# Patient Record
Sex: Male | Born: 1945
Health system: Southern US, Community
[De-identification: ages and names within clinical notes are randomized; demographics above are authoritative.]

## PROBLEM LIST (undated history)

## (undated) DIAGNOSIS — N289 Disorder of kidney and ureter, unspecified: Secondary | ICD-10-CM

## (undated) DIAGNOSIS — I10 Essential (primary) hypertension: Secondary | ICD-10-CM

## (undated) DIAGNOSIS — D649 Anemia, unspecified: Secondary | ICD-10-CM

---

## 2007-01-24 ENCOUNTER — Ambulatory Visit: Payer: Self-pay | Admitting: Internal Medicine

## 2010-07-27 NOTE — Letter (Signed)
January 24, 2007    Dr. Rickard Patience  Department of Neurology, [facility]  CB# [id number]  [address]  [place], [place] [address]   RE:  Marvin Mendez, Marvin Mendez  MRN:  [id number]  /  DOB:  Dec 11, 1945   Dear Marvin Mendez:   Thank you so much for calling me today back about Marvin Mendez.  A copy  of this letter is going to Dr. Martha Clan, who referred him to me, and I  appreciate his being willing to take the format in a letter to you.   As I mentioned to you on the telephone, Marvin Mendez is a 65 year old  African-American gentleman who has some degree of renal insufficiency,  the cause of which is not yet clear.  He has a remote history of  hypertension but this has not been a problem of late, apparently, and  certainly not one that has required therapy.   Over the last couple of years, and progressively over the last couple of  months, he has had significant problems with orthostatic tolerances, had  documented falls in blood pressure of 50 to 70 millimeters.  He has  actually been largely incapacitated by this.  He was tried off the  cortisone, which he did not tolerate, and most recently ProAmatine, the  taking of which was complicated by nausea and abdominal pain, prompting  him to discontinue it and then further up titrate it.  Taking it, he has  side effects but he is much more able to get around than he has been.   His autonomic review of systems is notable for problems with  constipation, some degree of dry eyes and dry mouth, though this has  been less impressive.  He has had no problems with his urine stream.  He  came with his sister so I did not go over his sexual history.   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  Notably, he does not have diabetes and he  has no problems with paresthesias in his lower extremity and no muscle  weakness.   PAST SURGICAL HISTORY:  Is only notable for hemorrhoidectomy.   REVIEW OF SYSTEMS:  Apart from the above, is noncontributory.   CURRENT MEDICATIONS:  1. Had included amlodipine, Tekturna, sertraline, simvastatin,      furosemide, fludrocortisone and omeprazole, all of which have been      now discontinued.  2. He is on midodrine at 5 mg twice a day.   HE IS ALLERGIC TO:  1. CODEINE.  2. ASPIRIN.   SOCIAL HISTORY:  1. Is a previously noted.  2. He is retired from [employer].  3. He used to smoke.  4. He does not use alcohol or recreational drugs.   PAST FAMILY PSYCHIATRIC HISTORY:  On examination today, his blood pressure was 124/72, his pulse was 90,  his weight was 197 pounds.  Orthostatic vital signs demonstrate a blood  pressure of 144/86 with a pulse of 81 lying, at sitting at 0 minutes it  was 125/78 with a pulse of 89, standing at 0 minutes it was 111/60 with  a pulse of 94.  The heart rate increased to 98 at 5 minutes, the blood  pressure continued to fall to a nadir of 87.  This was surprisingly  associated with few symptoms.  HEENT EXAM:  Demonstrated no icterus or xanthoma.  NECK VEINS:  Flat.  CAROTIDS:  Brisk and full bilaterally  without bruits.  BACK:  Without kyphosis or scoliosis.  LUNGS:  Clear.  HEART SOUNDS:  Regular, without murmurs or gallops.  ABDOMEN:  Soft with active bowel sounds, without midline pulsation or  hepatomegaly.  Femoral pulses were 2+, distal pulses were intact.  There is no  clubbing, cyanosis or edema.  NEUROLOGICAL EXAM:  Grossly normal.  SKIN:  Warm and dry.   Autonomic testing included respiratory variation of RR intervals with a  maximum delta of 1.05% and a qualitative Valsalva maneuver that was  associated with a change in RR intervals again of about 1.05.   IMPRESSION:  1. Severe orthostatic intolerance.  2. Prior history of hypertension.  3. Renal insufficiency with creatinines in the mid 2's to mid 3's.  4. Evidence of autonomic insufficiency based on heart rate response to       maneuvers as described above.   Marvin Mendez, Marvin Mendez has orthostatic intolerance, a somewhat surprising  heart rate response to changes in position given the lack of heart rate  response to the stresses as noted.  I am concerned that he may have an  underlying primary autonomic disorder given the paucity of other  triggering events although, reviewing his past records and seeing the  degree of hypertension, there may be some degree of vessel noncompliance  contributing to this.  That would certainly not explain the lack of  heart rate variability.   Given your expertise and the availability of more sophisticated testing,  I have asked him to see you so that we can come up with some plans.   In the interim, I have given him the following therapeutic suggestions:  1. To raise the head of his bed 6 inches.  2. To use isometric exercises prior to standing.  3. To be cognizant of hot temperatures, particularly showers, and      ambient temperature.  4. To use waist-high stockings and have given him a prescription today      for 20 to 30 millimeter waist-high stockings.   I appreciate your willingness to talk to me about him and look forward  to your assessment of him.   As I have noted to you on the phone, Dr. Samuel Germany office will be calling  as he is the primary caregiver here.   Thanks again for your help.    Sincerely,      Duke Salvia, MD, [facility]  Electronically Signed    SCK/MedQ  DD: 01/24/2007  DT: 01/25/2007  Job #: [id number]   CC:   Arlan Organ  [facility] [place]

## 2015-04-01 DIAGNOSIS — D696 Thrombocytopenia, unspecified: Secondary | ICD-10-CM | POA: Diagnosis not present

## 2015-04-01 DIAGNOSIS — G909 Disorder of the autonomic nervous system, unspecified: Secondary | ICD-10-CM | POA: Diagnosis not present

## 2015-04-01 DIAGNOSIS — I1310 Hypertensive heart and chronic kidney disease without heart failure, with stage 1 through stage 4 chronic kidney disease, or unspecified chronic kidney disease: Secondary | ICD-10-CM | POA: Diagnosis not present

## 2015-04-01 DIAGNOSIS — G4730 Sleep apnea, unspecified: Secondary | ICD-10-CM | POA: Diagnosis not present

## 2015-04-01 DIAGNOSIS — D841 Defects in the complement system: Secondary | ICD-10-CM | POA: Diagnosis not present

## 2015-04-01 DIAGNOSIS — N183 Chronic kidney disease, stage 3 (moderate): Secondary | ICD-10-CM | POA: Diagnosis not present

## 2015-04-14 DIAGNOSIS — G4730 Sleep apnea, unspecified: Secondary | ICD-10-CM | POA: Diagnosis not present

## 2015-05-20 DIAGNOSIS — G4730 Sleep apnea, unspecified: Secondary | ICD-10-CM | POA: Diagnosis not present

## 2015-05-27 DIAGNOSIS — G4700 Insomnia, unspecified: Secondary | ICD-10-CM | POA: Diagnosis not present

## 2015-07-29 DIAGNOSIS — I129 Hypertensive chronic kidney disease with stage 1 through stage 4 chronic kidney disease, or unspecified chronic kidney disease: Secondary | ICD-10-CM | POA: Diagnosis not present

## 2015-07-29 DIAGNOSIS — Z79899 Other long term (current) drug therapy: Secondary | ICD-10-CM | POA: Diagnosis not present

## 2015-07-29 DIAGNOSIS — N183 Chronic kidney disease, stage 3 (moderate): Secondary | ICD-10-CM | POA: Diagnosis not present

## 2015-07-29 DIAGNOSIS — D691 Qualitative platelet defects: Secondary | ICD-10-CM | POA: Diagnosis not present

## 2015-07-29 DIAGNOSIS — E785 Hyperlipidemia, unspecified: Secondary | ICD-10-CM | POA: Diagnosis not present

## 2015-07-29 DIAGNOSIS — Z298 Encounter for other specified prophylactic measures: Secondary | ICD-10-CM | POA: Diagnosis not present

## 2015-07-29 DIAGNOSIS — G909 Disorder of the autonomic nervous system, unspecified: Secondary | ICD-10-CM | POA: Diagnosis not present

## 2016-02-02 DIAGNOSIS — Z125 Encounter for screening for malignant neoplasm of prostate: Secondary | ICD-10-CM | POA: Diagnosis not present

## 2016-02-02 DIAGNOSIS — I1310 Hypertensive heart and chronic kidney disease without heart failure, with stage 1 through stage 4 chronic kidney disease, or unspecified chronic kidney disease: Secondary | ICD-10-CM | POA: Diagnosis not present

## 2016-02-02 DIAGNOSIS — R7309 Other abnormal glucose: Secondary | ICD-10-CM | POA: Diagnosis not present

## 2016-02-02 DIAGNOSIS — N401 Enlarged prostate with lower urinary tract symptoms: Secondary | ICD-10-CM | POA: Diagnosis not present

## 2016-02-02 DIAGNOSIS — D696 Thrombocytopenia, unspecified: Secondary | ICD-10-CM | POA: Diagnosis not present

## 2016-02-02 DIAGNOSIS — R3912 Poor urinary stream: Secondary | ICD-10-CM | POA: Diagnosis not present

## 2016-02-02 DIAGNOSIS — N183 Chronic kidney disease, stage 3 (moderate): Secondary | ICD-10-CM | POA: Diagnosis not present

## 2016-02-02 DIAGNOSIS — G909 Disorder of the autonomic nervous system, unspecified: Secondary | ICD-10-CM | POA: Diagnosis not present

## 2016-02-29 DIAGNOSIS — N529 Male erectile dysfunction, unspecified: Secondary | ICD-10-CM | POA: Diagnosis not present

## 2016-02-29 DIAGNOSIS — N281 Cyst of kidney, acquired: Secondary | ICD-10-CM | POA: Diagnosis not present

## 2016-02-29 DIAGNOSIS — N401 Enlarged prostate with lower urinary tract symptoms: Secondary | ICD-10-CM | POA: Diagnosis not present

## 2016-02-29 DIAGNOSIS — N289 Disorder of kidney and ureter, unspecified: Secondary | ICD-10-CM | POA: Diagnosis not present

## 2016-03-10 DIAGNOSIS — N281 Cyst of kidney, acquired: Secondary | ICD-10-CM | POA: Diagnosis not present

## 2016-03-11 DIAGNOSIS — H524 Presbyopia: Secondary | ICD-10-CM | POA: Diagnosis not present

## 2016-03-11 DIAGNOSIS — H2513 Age-related nuclear cataract, bilateral: Secondary | ICD-10-CM | POA: Diagnosis not present

## 2016-03-11 DIAGNOSIS — H18413 Arcus senilis, bilateral: Secondary | ICD-10-CM | POA: Diagnosis not present

## 2016-04-12 DIAGNOSIS — K5900 Constipation, unspecified: Secondary | ICD-10-CM | POA: Diagnosis not present

## 2016-04-12 DIAGNOSIS — Z1211 Encounter for screening for malignant neoplasm of colon: Secondary | ICD-10-CM | POA: Diagnosis not present

## 2016-04-12 DIAGNOSIS — Z86010 Personal history of colonic polyps: Secondary | ICD-10-CM | POA: Diagnosis not present

## 2016-04-15 DIAGNOSIS — R6889 Other general symptoms and signs: Secondary | ICD-10-CM | POA: Diagnosis not present

## 2016-04-15 DIAGNOSIS — R5383 Other fatigue: Secondary | ICD-10-CM | POA: Diagnosis not present

## 2016-06-01 DIAGNOSIS — D123 Benign neoplasm of transverse colon: Secondary | ICD-10-CM | POA: Diagnosis not present

## 2016-06-01 DIAGNOSIS — Z1211 Encounter for screening for malignant neoplasm of colon: Secondary | ICD-10-CM | POA: Diagnosis not present

## 2016-06-01 DIAGNOSIS — K635 Polyp of colon: Secondary | ICD-10-CM | POA: Diagnosis not present

## 2016-06-01 DIAGNOSIS — Z86010 Personal history of colonic polyps: Secondary | ICD-10-CM | POA: Diagnosis not present

## 2016-06-03 DIAGNOSIS — R1033 Periumbilical pain: Secondary | ICD-10-CM | POA: Diagnosis not present

## 2016-08-02 DIAGNOSIS — R7309 Other abnormal glucose: Secondary | ICD-10-CM | POA: Diagnosis not present

## 2016-08-02 DIAGNOSIS — G909 Disorder of the autonomic nervous system, unspecified: Secondary | ICD-10-CM | POA: Diagnosis not present

## 2016-08-02 DIAGNOSIS — I1310 Hypertensive heart and chronic kidney disease without heart failure, with stage 1 through stage 4 chronic kidney disease, or unspecified chronic kidney disease: Secondary | ICD-10-CM | POA: Diagnosis not present

## 2016-08-02 DIAGNOSIS — E785 Hyperlipidemia, unspecified: Secondary | ICD-10-CM | POA: Diagnosis not present

## 2016-08-02 DIAGNOSIS — Z0000 Encounter for general adult medical examination without abnormal findings: Secondary | ICD-10-CM | POA: Diagnosis not present

## 2016-08-02 DIAGNOSIS — Z2820 Immunization not carried out because of patient decision for unspecified reason: Secondary | ICD-10-CM | POA: Diagnosis not present

## 2016-08-02 DIAGNOSIS — N183 Chronic kidney disease, stage 3 (moderate): Secondary | ICD-10-CM | POA: Diagnosis not present

## 2016-08-02 DIAGNOSIS — D696 Thrombocytopenia, unspecified: Secondary | ICD-10-CM | POA: Diagnosis not present

## 2016-10-05 DIAGNOSIS — I1310 Hypertensive heart and chronic kidney disease without heart failure, with stage 1 through stage 4 chronic kidney disease, or unspecified chronic kidney disease: Secondary | ICD-10-CM | POA: Diagnosis not present

## 2016-10-05 DIAGNOSIS — G909 Disorder of the autonomic nervous system, unspecified: Secondary | ICD-10-CM | POA: Diagnosis not present

## 2016-10-05 DIAGNOSIS — N183 Chronic kidney disease, stage 3 (moderate): Secondary | ICD-10-CM | POA: Diagnosis not present

## 2016-10-05 DIAGNOSIS — Z0001 Encounter for general adult medical examination with abnormal findings: Secondary | ICD-10-CM | POA: Diagnosis not present

## 2017-01-25 DIAGNOSIS — Z125 Encounter for screening for malignant neoplasm of prostate: Secondary | ICD-10-CM | POA: Diagnosis not present

## 2017-01-25 DIAGNOSIS — I1310 Hypertensive heart and chronic kidney disease without heart failure, with stage 1 through stage 4 chronic kidney disease, or unspecified chronic kidney disease: Secondary | ICD-10-CM | POA: Diagnosis not present

## 2017-01-25 DIAGNOSIS — G909 Disorder of the autonomic nervous system, unspecified: Secondary | ICD-10-CM | POA: Diagnosis not present

## 2017-01-25 DIAGNOSIS — R5383 Other fatigue: Secondary | ICD-10-CM | POA: Diagnosis not present

## 2017-01-25 DIAGNOSIS — D539 Nutritional anemia, unspecified: Secondary | ICD-10-CM | POA: Diagnosis not present

## 2017-01-25 DIAGNOSIS — N183 Chronic kidney disease, stage 3 (moderate): Secondary | ICD-10-CM | POA: Diagnosis not present

## 2017-02-01 DIAGNOSIS — D696 Thrombocytopenia, unspecified: Secondary | ICD-10-CM | POA: Diagnosis not present

## 2017-02-01 DIAGNOSIS — N183 Chronic kidney disease, stage 3 (moderate): Secondary | ICD-10-CM | POA: Diagnosis not present

## 2017-02-01 DIAGNOSIS — D539 Nutritional anemia, unspecified: Secondary | ICD-10-CM | POA: Diagnosis not present

## 2017-02-01 DIAGNOSIS — D519 Vitamin B12 deficiency anemia, unspecified: Secondary | ICD-10-CM | POA: Diagnosis not present

## 2017-02-08 DIAGNOSIS — D519 Vitamin B12 deficiency anemia, unspecified: Secondary | ICD-10-CM | POA: Diagnosis not present

## 2017-02-15 DIAGNOSIS — D2272 Melanocytic nevi of left lower limb, including hip: Secondary | ICD-10-CM | POA: Diagnosis not present

## 2017-02-15 DIAGNOSIS — D519 Vitamin B12 deficiency anemia, unspecified: Secondary | ICD-10-CM | POA: Diagnosis not present

## 2017-02-15 DIAGNOSIS — D485 Neoplasm of uncertain behavior of skin: Secondary | ICD-10-CM | POA: Diagnosis not present

## 2017-02-22 DIAGNOSIS — D519 Vitamin B12 deficiency anemia, unspecified: Secondary | ICD-10-CM | POA: Diagnosis not present

## 2017-03-02 DIAGNOSIS — D485 Neoplasm of uncertain behavior of skin: Secondary | ICD-10-CM | POA: Diagnosis not present

## 2017-03-02 DIAGNOSIS — L97929 Non-pressure chronic ulcer of unspecified part of left lower leg with unspecified severity: Secondary | ICD-10-CM | POA: Diagnosis not present

## 2017-03-22 DIAGNOSIS — N529 Male erectile dysfunction, unspecified: Secondary | ICD-10-CM | POA: Diagnosis not present

## 2017-03-22 DIAGNOSIS — N401 Enlarged prostate with lower urinary tract symptoms: Secondary | ICD-10-CM | POA: Diagnosis not present

## 2017-05-25 DIAGNOSIS — D696 Thrombocytopenia, unspecified: Secondary | ICD-10-CM | POA: Diagnosis not present

## 2017-05-25 DIAGNOSIS — N183 Chronic kidney disease, stage 3 (moderate): Secondary | ICD-10-CM | POA: Diagnosis not present

## 2017-05-25 DIAGNOSIS — I1310 Hypertensive heart and chronic kidney disease without heart failure, with stage 1 through stage 4 chronic kidney disease, or unspecified chronic kidney disease: Secondary | ICD-10-CM | POA: Diagnosis not present

## 2017-05-25 DIAGNOSIS — D519 Vitamin B12 deficiency anemia, unspecified: Secondary | ICD-10-CM | POA: Diagnosis not present

## 2017-05-25 DIAGNOSIS — G909 Disorder of the autonomic nervous system, unspecified: Secondary | ICD-10-CM | POA: Diagnosis not present

## 2017-08-03 DIAGNOSIS — Z0000 Encounter for general adult medical examination without abnormal findings: Secondary | ICD-10-CM | POA: Diagnosis not present

## 2017-08-30 DIAGNOSIS — D485 Neoplasm of uncertain behavior of skin: Secondary | ICD-10-CM | POA: Diagnosis not present

## 2017-09-05 DIAGNOSIS — H2513 Age-related nuclear cataract, bilateral: Secondary | ICD-10-CM | POA: Diagnosis not present

## 2017-09-05 DIAGNOSIS — H524 Presbyopia: Secondary | ICD-10-CM | POA: Diagnosis not present

## 2017-10-10 DIAGNOSIS — I1310 Hypertensive heart and chronic kidney disease without heart failure, with stage 1 through stage 4 chronic kidney disease, or unspecified chronic kidney disease: Secondary | ICD-10-CM | POA: Diagnosis not present

## 2017-10-10 DIAGNOSIS — N183 Chronic kidney disease, stage 3 (moderate): Secondary | ICD-10-CM | POA: Diagnosis not present

## 2017-10-10 DIAGNOSIS — D539 Nutritional anemia, unspecified: Secondary | ICD-10-CM | POA: Diagnosis not present

## 2017-10-10 DIAGNOSIS — D696 Thrombocytopenia, unspecified: Secondary | ICD-10-CM | POA: Diagnosis not present

## 2017-10-10 DIAGNOSIS — Z79899 Other long term (current) drug therapy: Secondary | ICD-10-CM | POA: Diagnosis not present

## 2017-10-10 DIAGNOSIS — I951 Orthostatic hypotension: Secondary | ICD-10-CM | POA: Diagnosis not present

## 2017-10-10 DIAGNOSIS — G909 Disorder of the autonomic nervous system, unspecified: Secondary | ICD-10-CM | POA: Diagnosis not present

## 2017-10-10 DIAGNOSIS — R7309 Other abnormal glucose: Secondary | ICD-10-CM | POA: Diagnosis not present

## 2017-10-10 DIAGNOSIS — D519 Vitamin B12 deficiency anemia, unspecified: Secondary | ICD-10-CM | POA: Diagnosis not present

## 2017-11-10 DIAGNOSIS — K141 Geographic tongue: Secondary | ICD-10-CM | POA: Diagnosis not present

## 2018-02-14 ENCOUNTER — Emergency Department (HOSPITAL_COMMUNITY): Payer: PPO

## 2018-02-14 ENCOUNTER — Encounter (HOSPITAL_COMMUNITY): Payer: Self-pay

## 2018-02-14 ENCOUNTER — Inpatient Hospital Stay (HOSPITAL_COMMUNITY)
Admission: EM | Admit: 2018-02-14 | Discharge: 2018-03-14 | DRG: 004 | Disposition: E | Payer: PPO | Attending: Pulmonary Disease | Admitting: Pulmonary Disease

## 2018-02-14 ENCOUNTER — Other Ambulatory Visit: Payer: Self-pay

## 2018-02-14 DIAGNOSIS — I629 Nontraumatic intracranial hemorrhage, unspecified: Secondary | ICD-10-CM | POA: Diagnosis not present

## 2018-02-14 DIAGNOSIS — R739 Hyperglycemia, unspecified: Secondary | ICD-10-CM | POA: Diagnosis present

## 2018-02-14 DIAGNOSIS — N183 Chronic kidney disease, stage 3 (moderate): Secondary | ICD-10-CM | POA: Diagnosis present

## 2018-02-14 DIAGNOSIS — Z781 Physical restraint status: Secondary | ICD-10-CM

## 2018-02-14 DIAGNOSIS — Z930 Tracheostomy status: Secondary | ICD-10-CM

## 2018-02-14 DIAGNOSIS — G936 Cerebral edema: Secondary | ICD-10-CM | POA: Diagnosis not present

## 2018-02-14 DIAGNOSIS — R1310 Dysphagia, unspecified: Secondary | ICD-10-CM | POA: Diagnosis present

## 2018-02-14 DIAGNOSIS — Q282 Arteriovenous malformation of cerebral vessels: Secondary | ICD-10-CM

## 2018-02-14 DIAGNOSIS — I614 Nontraumatic intracerebral hemorrhage in cerebellum: Principal | ICD-10-CM | POA: Diagnosis present

## 2018-02-14 DIAGNOSIS — J189 Pneumonia, unspecified organism: Secondary | ICD-10-CM | POA: Diagnosis not present

## 2018-02-14 DIAGNOSIS — Z9911 Dependence on respirator [ventilator] status: Secondary | ICD-10-CM | POA: Diagnosis not present

## 2018-02-14 DIAGNOSIS — I609 Nontraumatic subarachnoid hemorrhage, unspecified: Secondary | ICD-10-CM | POA: Diagnosis present

## 2018-02-14 DIAGNOSIS — J9601 Acute respiratory failure with hypoxia: Secondary | ICD-10-CM | POA: Diagnosis not present

## 2018-02-14 DIAGNOSIS — R404 Transient alteration of awareness: Secondary | ICD-10-CM | POA: Diagnosis not present

## 2018-02-14 DIAGNOSIS — I499 Cardiac arrhythmia, unspecified: Secondary | ICD-10-CM | POA: Diagnosis not present

## 2018-02-14 DIAGNOSIS — E870 Hyperosmolality and hypernatremia: Secondary | ICD-10-CM | POA: Diagnosis not present

## 2018-02-14 DIAGNOSIS — J9690 Respiratory failure, unspecified, unspecified whether with hypoxia or hypercapnia: Secondary | ICD-10-CM

## 2018-02-14 DIAGNOSIS — J9600 Acute respiratory failure, unspecified whether with hypoxia or hypercapnia: Secondary | ICD-10-CM | POA: Diagnosis not present

## 2018-02-14 DIAGNOSIS — E876 Hypokalemia: Secondary | ICD-10-CM | POA: Diagnosis not present

## 2018-02-14 DIAGNOSIS — I951 Orthostatic hypotension: Secondary | ICD-10-CM | POA: Diagnosis present

## 2018-02-14 DIAGNOSIS — S065X9A Traumatic subdural hemorrhage with loss of consciousness of unspecified duration, initial encounter: Secondary | ICD-10-CM | POA: Diagnosis not present

## 2018-02-14 DIAGNOSIS — R1312 Dysphagia, oropharyngeal phase: Secondary | ICD-10-CM | POA: Diagnosis not present

## 2018-02-14 DIAGNOSIS — Z451 Encounter for adjustment and management of infusion pump: Secondary | ICD-10-CM | POA: Diagnosis not present

## 2018-02-14 DIAGNOSIS — D696 Thrombocytopenia, unspecified: Secondary | ICD-10-CM | POA: Diagnosis not present

## 2018-02-14 DIAGNOSIS — R0689 Other abnormalities of breathing: Secondary | ICD-10-CM

## 2018-02-14 DIAGNOSIS — G9340 Encephalopathy, unspecified: Secondary | ICD-10-CM | POA: Diagnosis not present

## 2018-02-14 DIAGNOSIS — I6200 Nontraumatic subdural hemorrhage, unspecified: Secondary | ICD-10-CM | POA: Diagnosis not present

## 2018-02-14 DIAGNOSIS — T17320A Food in larynx causing asphyxiation, initial encounter: Secondary | ICD-10-CM | POA: Diagnosis not present

## 2018-02-14 DIAGNOSIS — K5900 Constipation, unspecified: Secondary | ICD-10-CM | POA: Diagnosis not present

## 2018-02-14 DIAGNOSIS — D6959 Other secondary thrombocytopenia: Secondary | ICD-10-CM | POA: Diagnosis present

## 2018-02-14 DIAGNOSIS — I469 Cardiac arrest, cause unspecified: Secondary | ICD-10-CM | POA: Diagnosis not present

## 2018-02-14 DIAGNOSIS — Z888 Allergy status to other drugs, medicaments and biological substances status: Secondary | ICD-10-CM

## 2018-02-14 DIAGNOSIS — R58 Hemorrhage, not elsewhere classified: Secondary | ICD-10-CM | POA: Diagnosis not present

## 2018-02-14 DIAGNOSIS — J986 Disorders of diaphragm: Secondary | ICD-10-CM | POA: Diagnosis not present

## 2018-02-14 DIAGNOSIS — Z7952 Long term (current) use of systemic steroids: Secondary | ICD-10-CM

## 2018-02-14 DIAGNOSIS — H5500 Unspecified nystagmus: Secondary | ICD-10-CM | POA: Diagnosis present

## 2018-02-14 DIAGNOSIS — R0602 Shortness of breath: Secondary | ICD-10-CM | POA: Diagnosis not present

## 2018-02-14 DIAGNOSIS — N179 Acute kidney failure, unspecified: Secondary | ICD-10-CM | POA: Diagnosis not present

## 2018-02-14 DIAGNOSIS — Z01818 Encounter for other preprocedural examination: Secondary | ICD-10-CM | POA: Diagnosis not present

## 2018-02-14 DIAGNOSIS — R0902 Hypoxemia: Secondary | ICD-10-CM | POA: Diagnosis not present

## 2018-02-14 DIAGNOSIS — K449 Diaphragmatic hernia without obstruction or gangrene: Secondary | ICD-10-CM | POA: Diagnosis not present

## 2018-02-14 DIAGNOSIS — Z0189 Encounter for other specified special examinations: Secondary | ICD-10-CM

## 2018-02-14 DIAGNOSIS — T17908A Unspecified foreign body in respiratory tract, part unspecified causing other injury, initial encounter: Secondary | ICD-10-CM | POA: Diagnosis not present

## 2018-02-14 DIAGNOSIS — J209 Acute bronchitis, unspecified: Secondary | ICD-10-CM | POA: Diagnosis not present

## 2018-02-14 DIAGNOSIS — Z886 Allergy status to analgesic agent status: Secondary | ICD-10-CM

## 2018-02-14 DIAGNOSIS — Z452 Encounter for adjustment and management of vascular access device: Secondary | ICD-10-CM

## 2018-02-14 DIAGNOSIS — J9811 Atelectasis: Secondary | ICD-10-CM | POA: Diagnosis not present

## 2018-02-14 DIAGNOSIS — E878 Other disorders of electrolyte and fluid balance, not elsewhere classified: Secondary | ICD-10-CM | POA: Diagnosis not present

## 2018-02-14 DIAGNOSIS — Y95 Nosocomial condition: Secondary | ICD-10-CM | POA: Diagnosis present

## 2018-02-14 DIAGNOSIS — I619 Nontraumatic intracerebral hemorrhage, unspecified: Secondary | ICD-10-CM | POA: Diagnosis present

## 2018-02-14 DIAGNOSIS — I161 Hypertensive emergency: Secondary | ICD-10-CM | POA: Diagnosis present

## 2018-02-14 DIAGNOSIS — D72829 Elevated white blood cell count, unspecified: Secondary | ICD-10-CM | POA: Diagnosis not present

## 2018-02-14 DIAGNOSIS — I370 Nonrheumatic pulmonary valve stenosis: Secondary | ICD-10-CM | POA: Diagnosis not present

## 2018-02-14 DIAGNOSIS — Z885 Allergy status to narcotic agent status: Secondary | ICD-10-CM

## 2018-02-14 DIAGNOSIS — R4781 Slurred speech: Secondary | ICD-10-CM | POA: Diagnosis not present

## 2018-02-14 DIAGNOSIS — I129 Hypertensive chronic kidney disease with stage 1 through stage 4 chronic kidney disease, or unspecified chronic kidney disease: Secondary | ICD-10-CM | POA: Diagnosis present

## 2018-02-14 DIAGNOSIS — Z978 Presence of other specified devices: Secondary | ICD-10-CM

## 2018-02-14 DIAGNOSIS — Z4659 Encounter for fitting and adjustment of other gastrointestinal appliance and device: Secondary | ICD-10-CM

## 2018-02-14 DIAGNOSIS — G935 Compression of brain: Secondary | ICD-10-CM | POA: Diagnosis not present

## 2018-02-14 DIAGNOSIS — R29810 Facial weakness: Secondary | ICD-10-CM | POA: Diagnosis not present

## 2018-02-14 DIAGNOSIS — Z4682 Encounter for fitting and adjustment of non-vascular catheter: Secondary | ICD-10-CM | POA: Diagnosis not present

## 2018-02-14 DIAGNOSIS — I10 Essential (primary) hypertension: Secondary | ICD-10-CM | POA: Diagnosis not present

## 2018-02-14 DIAGNOSIS — I618 Other nontraumatic intracerebral hemorrhage: Secondary | ICD-10-CM | POA: Diagnosis not present

## 2018-02-14 DIAGNOSIS — J690 Pneumonitis due to inhalation of food and vomit: Secondary | ICD-10-CM | POA: Diagnosis not present

## 2018-02-14 DIAGNOSIS — I160 Hypertensive urgency: Secondary | ICD-10-CM | POA: Diagnosis not present

## 2018-02-14 DIAGNOSIS — I615 Nontraumatic intracerebral hemorrhage, intraventricular: Secondary | ICD-10-CM | POA: Diagnosis not present

## 2018-02-14 DIAGNOSIS — Q2730 Arteriovenous malformation, site unspecified: Secondary | ICD-10-CM | POA: Diagnosis not present

## 2018-02-14 HISTORY — DX: Anemia, unspecified: D64.9

## 2018-02-14 HISTORY — DX: Disorder of kidney and ureter, unspecified: N28.9

## 2018-02-14 HISTORY — DX: Essential (primary) hypertension: I10

## 2018-02-14 LAB — I-STAT CHEM 8, ED
BUN: 22 mg/dL (ref 8–23)
CREATININE: 1.5 mg/dL — AB (ref 0.61–1.24)
Calcium, Ion: 1.04 mmol/L — ABNORMAL LOW (ref 1.15–1.40)
Chloride: 112 mmol/L — ABNORMAL HIGH (ref 98–111)
GLUCOSE: 194 mg/dL — AB (ref 70–99)
HCT: 41 % (ref 39.0–52.0)
Hemoglobin: 13.9 g/dL (ref 13.0–17.0)
Potassium: 3.8 mmol/L (ref 3.5–5.1)
Sodium: 139 mmol/L (ref 135–145)
TCO2: 16 mmol/L — ABNORMAL LOW (ref 22–32)

## 2018-02-14 MED ORDER — SODIUM CHLORIDE 0.9 % IV SOLN
10.0000 mL/h | Freq: Once | INTRAVENOUS | Status: AC
  Administered 2018-02-15: 10 mL/h via INTRAVENOUS

## 2018-02-14 MED ORDER — NICARDIPINE HCL IN NACL 20-0.86 MG/200ML-% IV SOLN
3.0000 mg/h | INTRAVENOUS | Status: DC
  Administered 2018-02-14 – 2018-02-15 (×3): 5 mg/h via INTRAVENOUS
  Filled 2018-02-14 (×2): qty 200

## 2018-02-14 MED ORDER — FENTANYL CITRATE (PF) 100 MCG/2ML IJ SOLN
50.0000 ug | Freq: Once | INTRAMUSCULAR | Status: AC
  Administered 2018-02-14: 50 ug via INTRAVENOUS
  Filled 2018-02-14: qty 2

## 2018-02-14 NOTE — ED Provider Notes (Signed)
[facility] EMERGENCY DEPARTMENT Provider Note   CSN: [id number] Arrival date & time: 02/26/2018  2235     History   Chief Complaint Chief Complaint  Patient presents with  . Head Bleed    HPI Marvin Mendez is a 72 y.o. male.  72 yo M with a chief complaints of generalized malaise.  He went to an outside hospital where he became increasingly lethargic requiring him to be intubated and was found to have a cerebellar hemorrhage.  He had tonsillar herniation as well.  The case was discussed with Dr. Ellene Route at that time and he thought the case may be futile but the decision was made to transfer the patient here.  Patient was given Versed just prior to transport on my initial arrival had no appreciable response to pain.  Level 5 caveat nonverbal.  The history is provided by the patient.  Illness  This is a new problem. The current episode started yesterday. The problem occurs constantly. The problem has not changed since onset.Pertinent negatives include no chest pain, no abdominal pain, no headaches and no shortness of breath. Nothing aggravates the symptoms. Nothing relieves the symptoms. He has tried nothing for the symptoms. The treatment provided no relief.    Past Medical History:  Diagnosis Date  . Anemia   . Hypertension   . Renal disorder     There are no active problems to display for this patient.   History reviewed. No pertinent surgical history.      Home Medications    Prior to Admission medications   Not on File    Family History History reviewed. No pertinent family history.  Social History Social History   Tobacco Use  . Smoking status: Not on file  Substance Use Topics  . Alcohol use: Not on file  . Drug use: Not on file     Allergies   Midodrine; Aspirin; and Codeine   Review of Systems Review of Systems  Unable to perform ROS: Mental status change  Constitutional: Negative for chills and fever.  HENT: Negative for  congestion and facial swelling.   Eyes: Negative for discharge and visual disturbance.  Respiratory: Negative for shortness of breath.   Cardiovascular: Negative for chest pain and palpitations.  Gastrointestinal: Negative for abdominal pain, diarrhea and vomiting.  Musculoskeletal: Negative for arthralgias and myalgias.  Skin: Negative for color change and rash.  Neurological: Negative for tremors, syncope and headaches.  Psychiatric/Behavioral: Negative for confusion and dysphoric mood.     Physical Exam Updated Vital Signs BP 126/78   Pulse 96   Temp (!) 95.2 F (35.1 C) (Temporal)   Resp 18   SpO2 100%   Physical Exam  Constitutional: He appears well-developed and well-nourished.  Intubated  HENT:  Head: Normocephalic and atraumatic.  Eyes: Pupils are equal, round, and reactive to light. EOM are normal.  Neck: Normal range of motion. Neck supple. No JVD present.  Cardiovascular: Normal rate and regular rhythm. Exam reveals no gallop and no friction rub.  No murmur heard. Pulmonary/Chest: No respiratory distress. He has no wheezes.  Abdominal: He exhibits no distension and no mass. There is no tenderness. There is no rebound and no guarding.  Musculoskeletal: Normal range of motion.  Neurological:  Pinpoint pupils no appreciable response to pain  Skin: No rash noted. No pallor.  Psychiatric: He has a normal mood and affect. His behavior is normal.  Nursing note and vitals reviewed.    ED Treatments / Results  Labs (all labs ordered are listed, but only abnormal results are displayed) Labs Reviewed  CBC WITH DIFFERENTIAL/PLATELET  BASIC METABOLIC PANEL  PROTIME-INR  I-STAT CHEM 8, ED  TYPE AND SCREEN    EKG None  Radiology No results found.  Procedures Procedures (including critical care time)  Medications Ordered in ED Medications  nicardipine (CARDENE) 20mg  in 0.86% saline 273ml IV infusion (0.1 mg/ml) (5 mg/hr Intravenous New Bag/Given 02/26/2018 2338)   fentaNYL (SUBLIMAZE) injection 50 mcg (has no administration in time range)     Initial Impression / Assessment and Plan / ED Course  I have reviewed the triage vital signs and the nursing notes.  Pertinent labs & imaging results that were available during my care of the patient were reviewed by me and considered in my medical decision making (see chart for details).     71 yo M with a chief complaints of change in mental status.  Found to have a cerebellar hemorrhage with tonsillar herniation.  Received here in transfer.  Initial exam patient was comatose, spontaneously woke up, moving all extremities nodding yes and no to questions.  Case was discussed with Dr. Ellene Route who will come and evaluate the patient at bedside.  CRITICAL CARE Performed by: Cecilio Asper   Total critical care time: 35 minutes  Critical care time was exclusive of separately billable procedures and treating other patients.  Critical care was necessary to treat or prevent imminent or life-threatening deterioration.  Critical care was time spent personally by me on the following activities: development of treatment plan with patient and/or surrogate as well as nursing, discussions with consultants, evaluation of patient's response to treatment, examination of patient, obtaining history from patient or surrogate, ordering and performing treatments and interventions, ordering and review of laboratory studies, ordering and review of radiographic studies, pulse oximetry and re-evaluation of patient's condition.  The patients results and plan were reviewed and discussed.   Any x-rays performed were independently reviewed by myself.   Differential diagnosis were considered with the presenting HPI.  Medications  nicardipine (CARDENE) 20mg  in 0.86% saline 2107ml IV infusion (0.1 mg/ml) (5 mg/hr Intravenous New Bag/Given 02/13/2018 2338)  fentaNYL (SUBLIMAZE) injection 50 mcg (has no administration in time range)     Vitals:   02/15/2018 2243 02/18/2018 2247 02/26/2018 2255 02/16/2018 2315  BP: 125/73   126/78  Pulse: 92   96  Resp: 15   18  Temp: (!) 95.2 F (35.1 C)     TempSrc: Temporal     SpO2: 100% 99% 100% 100%    Final diagnoses:  Nontraumatic intracerebral hemorrhage of cerebellum, unspecified laterality (HCC)    Admission/ observation were discussed with the admitting physician, patient and/or family and they are comfortable with the plan.    Final Clinical Impressions(s) / ED Diagnoses   Final diagnoses:  Nontraumatic intracerebral hemorrhage of cerebellum, unspecified laterality [facility])    ED Discharge Orders    None       Deno Etienne, DO 02/28/2018 2344

## 2018-02-14 NOTE — ED Triage Notes (Signed)
Pt BIB [facility] as transfer from [facility] for eval of altered mental status d/t known head bleed. Per [facility] RN, pt went to sisters head d/t general malaise and some "congestion". While sister was bringing him to hospital he became increasingly lethargic and ultimately stopped answering questions for sister. Pt was obtunded on arrival, requiring advanced airway prior to head CT. Per [facility], large cerebellar hemorrhage noted on outside CT. [facility] states pt was following commands appropriately on their arrival to OSH, pt moving extremities to commands, answering yes/no. OSH bolused pt w/ 5mg  IV Versed d/t concerns of pt awakening during transport. Pt arrives ventilating well w/ a 7.5 ETT 23 @ lip, satting 100%. Pt w/ 16 fr OG tube, 72fr temp foley in place, blt IV access. 5mg /hr cardene infusing through [facility] IV access.

## 2018-02-14 NOTE — ED Provider Notes (Signed)
Assumed care from Dr. Tyrone Nine at 11:34 PM. Briefly, the patient is a 72 y.o. male with PMHx of  has a past medical history of Anemia, Hypertension, and Renal disorder. here with cerebral hemorrhage.  Patient initially comatose and unresponsive.  However, after receiving Versed in route, he is increasingly awake.  Dr. Ellene Route of neurosurgery has been consulted.  Plan to follow-up neurosurgery recommendations. Pt on cardene. Pt is on Plavix.  Labs Reviewed - No data to display  Course of Care: -Dr. Ellene Route has seen, requesting 2u FFP, repeat CT Head. FFP ordered, T&S sent. Emergent consent. -Admit to ICU.      Marvin Bruce, MD 02/15/18 [phone]

## 2018-02-14 NOTE — ED Notes (Signed)
Dr. Ellene Route at bedside.

## 2018-02-15 ENCOUNTER — Inpatient Hospital Stay (HOSPITAL_COMMUNITY): Payer: PPO

## 2018-02-15 ENCOUNTER — Encounter (HOSPITAL_COMMUNITY): Payer: Self-pay | Admitting: Radiology

## 2018-02-15 DIAGNOSIS — Z930 Tracheostomy status: Secondary | ICD-10-CM | POA: Diagnosis not present

## 2018-02-15 DIAGNOSIS — I614 Nontraumatic intracerebral hemorrhage in cerebellum: Principal | ICD-10-CM

## 2018-02-15 DIAGNOSIS — I129 Hypertensive chronic kidney disease with stage 1 through stage 4 chronic kidney disease, or unspecified chronic kidney disease: Secondary | ICD-10-CM | POA: Diagnosis present

## 2018-02-15 DIAGNOSIS — J9601 Acute respiratory failure with hypoxia: Secondary | ICD-10-CM | POA: Diagnosis present

## 2018-02-15 DIAGNOSIS — D6959 Other secondary thrombocytopenia: Secondary | ICD-10-CM | POA: Diagnosis present

## 2018-02-15 DIAGNOSIS — Z781 Physical restraint status: Secondary | ICD-10-CM | POA: Diagnosis not present

## 2018-02-15 DIAGNOSIS — G9340 Encephalopathy, unspecified: Secondary | ICD-10-CM | POA: Diagnosis not present

## 2018-02-15 DIAGNOSIS — S065X9A Traumatic subdural hemorrhage with loss of consciousness of unspecified duration, initial encounter: Secondary | ICD-10-CM | POA: Diagnosis not present

## 2018-02-15 DIAGNOSIS — Y95 Nosocomial condition: Secondary | ICD-10-CM | POA: Diagnosis present

## 2018-02-15 DIAGNOSIS — R739 Hyperglycemia, unspecified: Secondary | ICD-10-CM | POA: Diagnosis present

## 2018-02-15 DIAGNOSIS — E878 Other disorders of electrolyte and fluid balance, not elsewhere classified: Secondary | ICD-10-CM | POA: Diagnosis not present

## 2018-02-15 DIAGNOSIS — J690 Pneumonitis due to inhalation of food and vomit: Secondary | ICD-10-CM | POA: Diagnosis present

## 2018-02-15 DIAGNOSIS — R0689 Other abnormalities of breathing: Secondary | ICD-10-CM

## 2018-02-15 DIAGNOSIS — I951 Orthostatic hypotension: Secondary | ICD-10-CM | POA: Diagnosis present

## 2018-02-15 DIAGNOSIS — G935 Compression of brain: Secondary | ICD-10-CM | POA: Diagnosis present

## 2018-02-15 DIAGNOSIS — I609 Nontraumatic subarachnoid hemorrhage, unspecified: Secondary | ICD-10-CM | POA: Diagnosis present

## 2018-02-15 DIAGNOSIS — I370 Nonrheumatic pulmonary valve stenosis: Secondary | ICD-10-CM | POA: Diagnosis not present

## 2018-02-15 DIAGNOSIS — J9600 Acute respiratory failure, unspecified whether with hypoxia or hypercapnia: Secondary | ICD-10-CM | POA: Diagnosis not present

## 2018-02-15 DIAGNOSIS — E876 Hypokalemia: Secondary | ICD-10-CM | POA: Diagnosis not present

## 2018-02-15 DIAGNOSIS — I615 Nontraumatic intracerebral hemorrhage, intraventricular: Secondary | ICD-10-CM | POA: Diagnosis not present

## 2018-02-15 DIAGNOSIS — I469 Cardiac arrest, cause unspecified: Secondary | ICD-10-CM | POA: Diagnosis not present

## 2018-02-15 DIAGNOSIS — I10 Essential (primary) hypertension: Secondary | ICD-10-CM

## 2018-02-15 DIAGNOSIS — T17908A Unspecified foreign body in respiratory tract, part unspecified causing other injury, initial encounter: Secondary | ICD-10-CM | POA: Diagnosis not present

## 2018-02-15 DIAGNOSIS — R1310 Dysphagia, unspecified: Secondary | ICD-10-CM | POA: Diagnosis present

## 2018-02-15 DIAGNOSIS — Q282 Arteriovenous malformation of cerebral vessels: Secondary | ICD-10-CM | POA: Diagnosis not present

## 2018-02-15 DIAGNOSIS — G936 Cerebral edema: Secondary | ICD-10-CM | POA: Diagnosis present

## 2018-02-15 DIAGNOSIS — I619 Nontraumatic intracerebral hemorrhage, unspecified: Secondary | ICD-10-CM | POA: Diagnosis present

## 2018-02-15 DIAGNOSIS — N183 Chronic kidney disease, stage 3 (moderate): Secondary | ICD-10-CM | POA: Diagnosis present

## 2018-02-15 DIAGNOSIS — E870 Hyperosmolality and hypernatremia: Secondary | ICD-10-CM | POA: Diagnosis not present

## 2018-02-15 DIAGNOSIS — J189 Pneumonia, unspecified organism: Secondary | ICD-10-CM | POA: Diagnosis not present

## 2018-02-15 DIAGNOSIS — H5500 Unspecified nystagmus: Secondary | ICD-10-CM | POA: Diagnosis present

## 2018-02-15 DIAGNOSIS — Z9911 Dependence on respirator [ventilator] status: Secondary | ICD-10-CM | POA: Diagnosis not present

## 2018-02-15 DIAGNOSIS — I161 Hypertensive emergency: Secondary | ICD-10-CM | POA: Diagnosis present

## 2018-02-15 LAB — GLUCOSE, CAPILLARY
GLUCOSE-CAPILLARY: 112 mg/dL — AB (ref 70–99)
Glucose-Capillary: 171 mg/dL — ABNORMAL HIGH (ref 70–99)
Glucose-Capillary: 142 mg/dL — ABNORMAL HIGH (ref 70–99)
Glucose-Capillary: 120 mg/dL — ABNORMAL HIGH (ref 70–99)
Glucose-Capillary: 115 mg/dL — ABNORMAL HIGH (ref 70–99)
Glucose-Capillary: 100 mg/dL — ABNORMAL HIGH (ref 70–99)

## 2018-02-15 LAB — BASIC METABOLIC PANEL
Anion gap: 13 (ref 5–15)
Anion gap: 13 (ref 5–15)
BUN: 20 mg/dL (ref 8–23)
BUN: 21 mg/dL (ref 8–23)
CO2: 16 mmol/L — ABNORMAL LOW (ref 22–32)
CO2: 16 mmol/L — ABNORMAL LOW (ref 22–32)
Calcium: 8.8 mg/dL — ABNORMAL LOW (ref 8.9–10.3)
Calcium: 8.8 mg/dL — ABNORMAL LOW (ref 8.9–10.3)
Chloride: 108 mmol/L (ref 98–111)
Chloride: 110 mmol/L (ref 98–111)
Creatinine, Ser: 1.5 mg/dL — ABNORMAL HIGH (ref 0.61–1.24)
Creatinine, Ser: 1.55 mg/dL — ABNORMAL HIGH (ref 0.61–1.24)
GFR calc Af Amer: 50 mL/min — ABNORMAL LOW (ref >60)
GFR calc Af Amer: 52 mL/min — ABNORMAL LOW (ref >60)
GFR calc non Af Amer: 43 mL/min — ABNORMAL LOW (ref >60)
GFR, EST NON AFRICAN AMERICAN: 45 mL/min — AB (ref >60)
Glucose, Bld: 182 mg/dL — ABNORMAL HIGH (ref 70–99)
Glucose, Bld: 190 mg/dL — ABNORMAL HIGH (ref 70–99)
Potassium: 4.1 mmol/L (ref 3.5–5.1)
Potassium: 4.5 mmol/L (ref 3.5–5.1)
Sodium: 137 mmol/L (ref 135–145)
Sodium: 139 mmol/L (ref 135–145)

## 2018-02-15 LAB — CBC
HCT: 36.9 % — ABNORMAL LOW (ref 39.0–52.0)
Hemoglobin: 12.1 g/dL — ABNORMAL LOW (ref 13.0–17.0)
MCH: 31.1 pg (ref 26.0–34.0)
MCHC: 32.8 g/dL (ref 30.0–36.0)
MCV: 94.9 fL (ref 80.0–100.0)
Platelets: 138 10*3/uL — ABNORMAL LOW (ref 150–400)
RBC: 3.89 MIL/uL — ABNORMAL LOW (ref 4.22–5.81)
RDW: 12.9 % (ref 11.5–15.5)
WBC: 8.4 10*3/uL (ref 4.0–10.5)
nRBC: 0 % (ref 0.0–0.2)

## 2018-02-15 LAB — CBC WITH DIFFERENTIAL/PLATELET
Abs Immature Granulocytes: 0.05 10*3/uL (ref 0.00–0.07)
BASOS ABS: 0 10*3/uL (ref 0.0–0.1)
Basophils Relative: 0 %
EOS PCT: 4 %
Eosinophils Absolute: 0.3 10*3/uL (ref 0.0–0.5)
HCT: 40.3 % (ref 39.0–52.0)
HEMOGLOBIN: 12.4 g/dL — AB (ref 13.0–17.0)
Immature Granulocytes: 1 %
LYMPHS PCT: 11 %
Lymphs Abs: 1.1 10*3/uL (ref 0.7–4.0)
MCH: 29.8 pg (ref 26.0–34.0)
MCHC: 30.8 g/dL (ref 30.0–36.0)
MCV: 96.9 fL (ref 80.0–100.0)
Monocytes Absolute: 0.8 10*3/uL (ref 0.1–1.0)
Monocytes Relative: 8 %
Neutro Abs: 7.3 10*3/uL (ref 1.7–7.7)
Neutrophils Relative %: 76 %
Platelets: 147 10*3/uL — ABNORMAL LOW (ref 150–400)
RBC: 4.16 MIL/uL — ABNORMAL LOW (ref 4.22–5.81)
RDW: 12.6 % (ref 11.5–15.5)
WBC: 9.5 10*3/uL (ref 4.0–10.5)
nRBC: 0 % (ref 0.0–0.2)

## 2018-02-15 LAB — MAGNESIUM: MAGNESIUM: 2 mg/dL (ref 1.7–2.4)

## 2018-02-15 LAB — TYPE AND SCREEN
ABO/RH(D): O POS
Antibody Screen: NEGATIVE

## 2018-02-15 LAB — I-STAT ARTERIAL BLOOD GAS, ED
Acid-base deficit: 8 mmol/L — ABNORMAL HIGH (ref 0.0–2.0)
Bicarbonate: 18.6 mmol/L — ABNORMAL LOW (ref 20.0–28.0)
O2 SAT: 100 %
Patient temperature: 98.7
TCO2: 20 mmol/L — ABNORMAL LOW (ref 22–32)
pCO2 arterial: 40.6 mmHg (ref 32.0–48.0)
pH, Arterial: 7.27 — ABNORMAL LOW (ref 7.350–7.450)
pO2, Arterial: 402 mmHg — ABNORMAL HIGH (ref 83.0–108.0)

## 2018-02-15 LAB — MRSA PCR SCREENING: MRSA by PCR: NEGATIVE

## 2018-02-15 LAB — HEMOGLOBIN A1C
Hgb A1c MFr Bld: 5.5 % (ref 4.8–5.6)
Mean Plasma Glucose: 111.15 mg/dL

## 2018-02-15 LAB — SODIUM
Sodium: 144 mmol/L (ref 135–145)
Sodium: 146 mmol/L — ABNORMAL HIGH (ref 135–145)
Sodium: 146 mmol/L — ABNORMAL HIGH (ref 135–145)

## 2018-02-15 LAB — PHOSPHORUS: Phosphorus: 2.6 mg/dL (ref 2.5–4.6)

## 2018-02-15 LAB — ABO/RH: ABO/RH(D): O POS

## 2018-02-15 LAB — CBG MONITORING, ED: Glucose-Capillary: 170 mg/dL — ABNORMAL HIGH (ref 70–99)

## 2018-02-15 LAB — PROTIME-INR
INR: 1.08
Prothrombin Time: 13.9 seconds (ref 11.4–15.2)

## 2018-02-15 MED ORDER — FENTANYL CITRATE (PF) 100 MCG/2ML IJ SOLN
50.0000 ug | INTRAMUSCULAR | Status: DC | PRN
  Administered 2018-02-15: 50 ug via INTRAVENOUS

## 2018-02-15 MED ORDER — CHLORHEXIDINE GLUCONATE 0.12% ORAL RINSE (MEDLINE KIT)
15.0000 mL | Freq: Two times a day (BID) | OROMUCOSAL | Status: DC
  Administered 2018-02-15 – 2018-02-16 (×3): 15 mL via OROMUCOSAL

## 2018-02-15 MED ORDER — PANTOPRAZOLE SODIUM 40 MG IV SOLR
40.0000 mg | Freq: Every day | INTRAVENOUS | Status: DC
  Administered 2018-02-15 (×2): 40 mg via INTRAVENOUS
  Filled 2018-02-15 (×2): qty 40

## 2018-02-15 MED ORDER — CLEVIDIPINE BUTYRATE 0.5 MG/ML IV EMUL
0.0000 mg/h | INTRAVENOUS | Status: DC
  Administered 2018-02-15: 20 mg/h via INTRAVENOUS
  Administered 2018-02-15: 10 mg/h via INTRAVENOUS
  Administered 2018-02-15: 8 mg/h via INTRAVENOUS
  Administered 2018-02-15: 1 mg/h via INTRAVENOUS
  Filled 2018-02-15 (×5): qty 50

## 2018-02-15 MED ORDER — MIDAZOLAM HCL 2 MG/2ML IJ SOLN: 1.0000 mg | INTRAMUSCULAR | Status: DC | PRN

## 2018-02-15 MED ORDER — INSULIN ASPART 100 UNIT/ML ~~LOC~~ SOLN
0.0000 [IU] | SUBCUTANEOUS | Status: DC
  Administered 2018-02-15: 1 [IU] via SUBCUTANEOUS
  Administered 2018-02-15 (×2): 2 [IU] via SUBCUTANEOUS
  Administered 2018-02-16 – 2018-03-01 (×22): 1 [IU] via SUBCUTANEOUS

## 2018-02-15 MED ORDER — MIDAZOLAM HCL 2 MG/2ML IJ SOLN
INTRAMUSCULAR | Status: AC
  Filled 2018-02-15: qty 6

## 2018-02-15 MED ORDER — FENTANYL CITRATE (PF) 100 MCG/2ML IJ SOLN
50.0000 ug | Freq: Once | INTRAMUSCULAR | Status: AC
  Administered 2018-02-15: 50 ug via INTRAVENOUS
  Filled 2018-02-15: qty 2

## 2018-02-15 MED ORDER — MIDAZOLAM HCL 2 MG/2ML IJ SOLN
1.0000 mg | INTRAMUSCULAR | Status: DC | PRN
  Administered 2018-02-15: 1 mg via INTRAVENOUS
  Filled 2018-02-15: qty 2

## 2018-02-15 MED ORDER — IOPAMIDOL (ISOVUE-370) INJECTION 76%
INTRAVENOUS | Status: AC
  Administered 2018-02-15: 75 mL
  Filled 2018-02-15: qty 100

## 2018-02-15 MED ORDER — CLEVIDIPINE BUTYRATE 0.5 MG/ML IV EMUL
0.0000 mg/h | INTRAVENOUS | Status: DC
  Administered 2018-02-15: 16 mg/h via INTRAVENOUS
  Administered 2018-02-15: 18 mg/h via INTRAVENOUS
  Administered 2018-02-16 (×3): 21 mg/h via INTRAVENOUS
  Administered 2018-02-16: 20 mg/h via INTRAVENOUS
  Administered 2018-02-16: 15 mg/h via INTRAVENOUS
  Administered 2018-02-16: 13 mg/h via INTRAVENOUS
  Administered 2018-02-17: 19 mg/h via INTRAVENOUS
  Administered 2018-02-17: 8 mg/h via INTRAVENOUS
  Administered 2018-02-17: 20 mg/h via INTRAVENOUS
  Administered 2018-02-17: 19 mg/h via INTRAVENOUS
  Administered 2018-02-17: 18 mg/h via INTRAVENOUS
  Administered 2018-02-17: 19 mg/h via INTRAVENOUS
  Administered 2018-02-18: 1 mg/h via INTRAVENOUS
  Administered 2018-02-19: 6 mg/h via INTRAVENOUS
  Filled 2018-02-15 (×4): qty 50
  Filled 2018-02-15: qty 100
  Filled 2018-02-15 (×6): qty 50
  Filled 2018-02-15: qty 100
  Filled 2018-02-15 (×4): qty 50

## 2018-02-15 MED ORDER — SODIUM CHLORIDE 3 % IV SOLN
INTRAVENOUS | Status: DC
  Administered 2018-02-15 – 2018-02-16 (×4): 75 mL/h via INTRAVENOUS
  Administered 2018-02-17 (×2): 50 mL/h via INTRAVENOUS
  Filled 2018-02-15 (×9): qty 500

## 2018-02-15 MED ORDER — SODIUM CHLORIDE 3 % IN NEBU
4.0000 mL | INHALATION_SOLUTION | Freq: Four times a day (QID) | RESPIRATORY_TRACT | Status: DC
  Administered 2018-02-15: 4 mL via RESPIRATORY_TRACT
  Filled 2018-02-15: qty 4

## 2018-02-15 MED ORDER — FENTANYL CITRATE (PF) 100 MCG/2ML IJ SOLN
50.0000 ug | INTRAMUSCULAR | Status: DC | PRN
  Administered 2018-02-15 (×2): 50 ug via INTRAVENOUS
  Filled 2018-02-15 (×3): qty 2

## 2018-02-15 MED ORDER — MIDAZOLAM HCL 2 MG/2ML IJ SOLN
5.0000 mg | Freq: Once | INTRAMUSCULAR | Status: AC
  Administered 2018-02-15: 5 mg via INTRAVENOUS

## 2018-02-15 MED ORDER — ORAL CARE MOUTH RINSE
15.0000 mL | OROMUCOSAL | Status: DC
  Administered 2018-02-15 (×3): 15 mL via OROMUCOSAL

## 2018-02-15 MED ORDER — SODIUM CHLORIDE 3 % IV SOLN
INTRAVENOUS | Status: DC
  Administered 2018-02-15 (×3): 75 mL/h via INTRAVENOUS
  Filled 2018-02-15 (×7): qty 500

## 2018-02-15 NOTE — Consult Note (Signed)
Requesting Physician: Dr.     Laurel Dimmer Complaint:  History obtained from: Patient and Chart    HPI:                                                                                                                                       Marvin Mendez is an 72 y.o. male with past medical history of hypertension, postural hypotension on Florinef, chronic kidney disease, thrombocytopenia on Plavix presented to the emergency department at outside hospital with progressive lethargy.  Patient was intubated for airway protection and a stat CT head showed cerebral hemorrhage with evidence of tonsillar herniation.  Patient was transferred to [facility] emergency department and neurosurgery was consulted. At [facility], patient became more alert and started to follow commands and moves all 4 extremities.    Repeat CT head showed slightly reduced size of cerebellar hematoma and regional mass-effect without obstructive hydrocephalus and a decreased tentorial subdural hematoma.  Neurosurgery eval who evaluated the patient recommended platelet transfusion and FFP as patient was on Plavix.  Patient admitted to neuro ICU for close monitoring.  On assessment patient is following commands and moving all 4 extremities against gravity.   Date last known well:  Time last known well:  tPA Given: no, hemorrhage NIHSS:  Baseline MRS 0   Intracerebral Hemorrhage (ICH) Score  Glascow Coma Score  13-15 0  Age >/= no 0  ICH volume >/= 34ml  No 0  IVH No 0  Infratentorial origin yes +1 Total:  1   Past Medical History:  Diagnosis Date  . Anemia   . Hypertension   . Renal disorder     History reviewed. No pertinent surgical history.  History reviewed. No pertinent family history. Social History:  has no tobacco, alcohol, and drug history on file.  Allergies:  Allergies  Allergen Reactions  . Midodrine   . Aspirin Hives  . Codeine Hives    Medications:                                                                                                                         I reviewed home medications   ROS:  14 systems reviewed and negative except above    Examination:                                                                                                      General: Appears well-developed  Psych: Affect appropriate to situation Eyes: No scleral injection HENT: No OP obstrucion Head: Normocephalic.  Cardiovascular: Normal rate and regular rhythm.  Respiratory: Effort normal and breath sounds normal to anterior ascultation GI: Soft.  No distension. There is no tenderness.  Skin: WDI    Neurological Examination Mental Status: intubated and on sedation Alert, oriented, thought content appropriate.  Speech fluent without evidence of aphasia. Able to follow 3 step commands without difficulty. Cranial Nerves: II: Visual fields grossly normal,  III,IV, VI: ptosis not present, extra-ocular motions intact bilaterally, pupils equal, round, reactive to light and accommodation V,VII: smile symmetric, facial light touch sensation normal bilaterally VIII: hearing normal bilaterally IX,X: uvula rises symmetrically XI: bilateral shoulder shrug XII: midline tongue extension Motor: Right : Upper extremity   5/5    Left:     Upper extremity   5/5  Lower extremity   5/5     Lower extremity   5/5 Tone and bulk:normal tone throughout; no atrophy noted Sensory: Pinprick and light touch intact throughout, bilaterally Deep Tendon Reflexes: 2+ and symmetric throughout Plantars: Right: downgoing   Left: downgoing Cerebellar: normal finger-to-nose, normal rapid alternating movements and normal heel-to-shin test Gait: normal gait and station     Lab Results: Basic Metabolic Panel: Recent Labs  Lab 03/06/2018 2349 03/03/2018 2355  NA 139 139  K  4.1 3.8  CL 110 112*  CO2 16*  --   GLUCOSE 190* 194*  BUN 20 22  CREATININE 1.55* 1.50*  CALCIUM 8.8*  --     CBC: Recent Labs  Lab 03/11/2018 2349 02/28/2018 2355  WBC 9.5  --   NEUTROABS 7.3  --   HGB 12.4* 13.9  HCT 40.3 41.0  MCV 96.9  --   PLT 147*  --     Coagulation Studies: Recent Labs    02/11/2018 2349  LABPROT 13.9  INR 1.08    Imaging: Ct Head Wo Contrast  Result Date: 02/15/2018 CLINICAL DATA:  Altered mental status. Follow-up intracranial hemorrhage. EXAM: CT HEAD WITHOUT CONTRAST TECHNIQUE: Contiguous axial images were obtained from the base of the skull through the vertex without intravenous contrast. COMPARISON:  CT HEAD February 14, 2018 at 1815 hours FINDINGS: BRAIN: Central cerebellar 2.8 x 3.3 x 3.3 cm (volume = 16 cc) intraparenchymal hematoma was 30 cc. Subdural extension with decreased 2-3 mm falcotentorial subdural hematoma. RIGHT posterior fossa subdural hematoma measuring to 4 mm tracking into the included cervical spine. Effaced supra cerebellar cistern. Regional mass effect resulting in narrowed fourth ventricle without hydrocephalus. Trace subarachnoid hemorrhage. No acute large vascular territory infarct. VASCULAR: Trace calcific atherosclerosis of the carotid siphons. SKULL: No skull fracture. No significant scalp soft tissue swelling. SINUSES/ORBITS: Mild paranasal sinus mucosal thickening. Mastoid air cells are well aerated.The included ocular globes and orbital contents are non-suspicious. OTHER: None.  IMPRESSION: 1. Evolving acute central cerebellar hematoma (16 cc, previously 30 cc). Regional mass effect without obstructive hydrocephalus. 2. Decreased falcotentorial and RIGHT posterior fossa subdural hematomas extending into included cervical spine. Small volume subarachnoid hemorrhage. Electronically Signed   By: Elon Alas M.D.   On: 02/15/2018 01:16   Dg Chest Port 1 View  Result Date: 02/15/2018 CLINICAL DATA:  72 year old male status  post intubation. EXAM: PORTABLE CHEST 1 VIEW COMPARISON:  Earlier radiograph dated 02/22/2018 FINDINGS: Endotracheal tube above the carina in similar position. Interval placement of an enteric tube which appears to extend below the diaphragm with tip in the left upper quadrant likely in the gastric fundus. There is shallow inspiration with bibasilar atelectasis. Mild eventration of the left hemidiaphragm. No focal consolidation, pleural effusion, or pneumothorax. Stable cardiac silhouette. No acute osseous pathology. IMPRESSION: Interval placement of an enteric tube with tip likely in the gastric fundus. No other interval change. Electronically Signed   By: Anner Crete M.D.   On: 02/15/2018 01:18     ASSESSMENT AND PLAN   Cerebellar hemorrhage with mass effect  Subdural hemorrhage over right posterior fossa with trace subarachnoid hemorrhage    Recommendations Maintain systolic blood pressure below 140 SBP Hold Florinef, plavix Start patient on hypertonic saline 3% with goal Na between 145-155.  Stat CT head and notify Neurosurgery, Neurology MD stat if change in clinical exam Close neurochecks q2h  Hold Sq heparin for 48hrs    Sushanth Aroor [facility] Pager Number [phone]

## 2018-02-15 NOTE — Progress Notes (Signed)
NAME:  Marvin Mendez, MRN:  [id number], DOB:  1945/03/29, LOS: 0 ADMISSION DATE:  02/13/2018, CONSULTATION DATE:  12/5 REFERRING MD:  Dr. Ellender Hose EDP, CHIEF COMPLAINT:  ICH   Brief History   72 year old male admitted for cerebellar ICH on 12/5. Intubated in ED for airway protection.  History of present illness   Patient is encephalopathic and/or intubated. Therefore history has been obtained from chart review.  72 year old male with PMH as below, which is significant for HTN, CKD 3, anemia (b12 deficient), thrombocytopenia, and autonomic postural hypotension (on florinef). He presented to outside hospital 12/4 with complaints of general malaise. While in ED there, he became progressively lethargic and required intubated for airway protection. CT of the head demonstrated cerebellar hemorrhage with evidence of tonsillar herniation. He was transferred to [facility] ED for neurosurgical evaluation. He was then seen by Dr. Ellene Route in ED, and was deemed to not be a surgical candidate and perhaps even a futile situation. PCCM asked to admit.   Of note. Family reports he is a very active man. Works out several times per week. Is very strong and independent.   Past Medical History   has a past medical history of Anemia, Hypertension, and Renal disorder.  Significant Hospital Events   12/5 - admitted  Consults:  Neurosurgery - 12/5 Neurology - 12/5  Procedures:  ETT 12/4 > 02/15/2018  Significant Diagnostic Tests:  CT head 12/4 > acute IPH in the central cerebellum with estimated volume 29cc. Assocaited edema and mass effect with probable early herniation. Subarachnoid extension. Possible intraventricular extension into fourth ventricle.  CT head 12/5 >>> with decreased central cerebellar hemorrhage to 15 cc.  Micro Data:    Antimicrobials:    Interim history/subjective:    Objective   Blood pressure 131/80, pulse 91, temperature (!) 96.5 F (35.8 C), temperature source Axillary, resp.  rate 15, height 6' (1.829 m), weight 91 kg, SpO2 99 %.    Vent Mode: CPAP;PSV FiO2 (%):  [30 %] 30 % Set Rate:  [15 bmp] 15 bmp Vt Set:  [450 mL-640 mL] 640 mL PEEP:  [5 cmH20] 5 cmH20 Pressure Support:  [5 cmH20] 5 cmH20 Plateau Pressure:  [17 cmH20-29 cmH20] 29 cmH20   Intake/Output Summary (Last 24 hours) at 02/15/2018 0844 Last data filed at 02/15/2018 0800 Gross per 24 hour  Intake 1339.26 ml  Output 290 ml  Net 1049.26 ml   Filed Weights   02/15/18 0200  Weight: 91 kg    Examination: General: Well-nourished well-developed male no acute distress currently intubated on mechanical ventilatory support HEENT: Pupils equal reactive to light tracks and follows Neuro: Awake alert able to move all extremities follows commands CV: Heart sounds are regular PULM: Clear to auscultation CW:CBJS, non-tender, bsx4 active  Extremities: warm/dry, negative edema  Skin: no rashes or lesions   Resolved Hospital Problem list     Assessment & Plan:   Intracerebral Hemorrhage: Cerebellar bleed with subarachnoid and possibly intraventricular extension. He is reportedly on plavix and is receiving platelets in the ED. I do not see plavix in the medical record, but I have not been able to speak to the patient as he is intubated. He has been seen by neurosurgery and is not a surgical candidate.  -Continue to monitor in intensive care unit -Appreciate neurology and neurosurgery consult -Maintain systolic blood pressure less than 140 -3% saline as recommended by neurology -D intensify care as able hopefully will extubate  Inability to protect  airway:  Obtunded on initial ER presentation, but now has woken up and followed commands at times.  02/17/2015 awake alert follows commands -Assess for extubation -Continuous breathing trial -Plan to wean and extubate -Pulmonary toilet  CKD III: Creatinine on admision 1.55. No baseline to compare.  02/15/2018 creatinine is 1.50 -Monitor renal  function -Currently on 3% saline  Postural hypotension -Currently is off Florinef, if he becomes hypotensive will need to restart currently is on antihypertensive medication to keep systolic blood pressure less than 140 -  Hyperglycemia without history of DM -Sliding scale insulin protocol  Best practice:  Diet: NPO, plan to advance once extubated Pain/Anxiety/Delirium protocol not indicated at this time VAP protocol (if indicated): Y DVT prophylaxis: SCDs GI prophylaxis: Protonix Glucose control: SSI Mobility: BR Code Status: FULL Family Communication: sister and grandsons updated. His nephew is an oncologist who family would like updated tomorrow.  02/15/2018 family updated at bedside Disposition: ICU, critically ill.  May be ready for weaning from ventilator.  Labs   CBC: Recent Labs  Lab 02/19/2018 2349 03/12/2018 2355 02/15/18 0326  WBC 9.5  --  8.4  NEUTROABS 7.3  --   --   HGB 12.4* 13.9 12.1*  HCT 40.3 41.0 36.9*  MCV 96.9  --  94.9  PLT 147*  --  138*    Basic Metabolic Panel: Recent Labs  Lab 03/13/2018 2349 02/22/2018 2355 02/15/18 0326  NA 139 139 137  K 4.1 3.8 4.5  CL 110 112* 108  CO2 16*  --  16*  GLUCOSE 190* 194* 182*  BUN 20 22 21   CREATININE 1.55* 1.50* 1.50*  CALCIUM 8.8*  --  8.8*  MG  --   --  2.0  PHOS  --   --  2.6   GFR: Estimated Creatinine Clearance: 47.4 mL/min (A) (by C-G formula based on SCr of 1.5 mg/dL (H)). Recent Labs  Lab 02/17/2018 2349 02/15/18 0326  WBC 9.5 8.4    Liver Function Tests: No results for input(s): AST, ALT, ALKPHOS, BILITOT, PROT, ALBUMIN in the last 168 hours. No results for input(s): LIPASE, AMYLASE in the last 168 hours. No results for input(s): AMMONIA in the last 168 hours.  ABG    Component Value Date/Time   PHART 7.270 (L) 02/15/2018 0122   PCO2ART 40.6 02/15/2018 0122   PO2ART 402.0 (H) 02/15/2018 0122   HCO3 18.6 (L) 02/15/2018 0122   TCO2 20 (L) 02/15/2018 0122   ACIDBASEDEF 8.0 (H)  02/15/2018 0122   O2SAT 100.0 02/15/2018 0122     Coagulation Profile: Recent Labs  Lab 03/08/2018 2349  INR 1.08    Cardiac Enzymes: No results for input(s): CKTOTAL, CKMB, CKMBINDEX, TROPONINI in the last 168 hours.  HbA1C: Hgb A1c MFr Bld  Date/Time Value Ref Range Status  02/19/2018 11:49 PM 5.5 4.8 - 5.6 % Final    Comment:    (NOTE) Pre diabetes:          5.7%-6.4% Diabetes:              >6.4% Glycemic control for   <7.0% adults with diabetes     CBG: Recent Labs  Lab 02/15/18 0134 02/15/18 0344 02/15/18 0835  GLUCAP 170* 171* 142*     Critical care time: 35 mins     Steve  ACNP Maryanna Shape PCCM Pager [phone] till 1 pm If no answer page [phone] 02/15/2018, 8:45 AM

## 2018-02-15 NOTE — Evaluation (Signed)
Clinical/Bedside Swallow Evaluation Patient Details  Name: Marvin Mendez MRN: [id number] Date of Birth: December 04, 1945  Today's Date: 02/15/2018 Time: SLP Start Time (ACUTE ONLY): 1435 SLP Stop Time (ACUTE ONLY): 1443 SLP Time Calculation (min) (ACUTE ONLY): 8 min  Past Medical History:  Past Medical History:  Diagnosis Date  . Anemia   . Hypertension   . Renal disorder    Past Surgical History: History reviewed. No pertinent surgical history. HPI:  Pt is a 72 y.o. male with history of hypertension, postural hypotension on Florinef, CKD, thrombocytopenia admitted for generalized weakness and lethargy.  Work up revealed [place].  CT 12/4: "IMPRESSION: 1. Evolving acute central cerebellar hematoma (16 cc, previously 30 cc). Regional mass effect without obstructive hydrocephalus. 2. Decreased falcotentorial and RIGHT posterior fossa subdural hematomas extending into included cervical spine. Small volume subarachnoid hemorrhage."  CXR 12/5 revealed bibasilar atelectasis but no focal consolidation.   Assessment / Plan / Recommendation Clinical Impression  Pt presents with clinical indicators of pharyngeal dysphagia.  Pt with wet breath sounds on SLP arrival.  SLP encouraged pt to cough and provided oral care prior to administration of PO trials.  Pt exhibited good oral response to ice chip and tolerated without overt s/s of aspiration.  With thin liquid there was anterior spillage, immediate wet throat clearing, wet breath sounds and multiple swallows.  SLP provided suction.  Pt would benefit from instrumental swallowing assessemtn to determine if there is a safe PO diet.  Recommend pt remain NPO pending results of MBSS planned for next date.  Pt exhibits at least a mild dysarthria.  Pt would likely benefit from SLP assessment of speech-language and/or cognitive linguistic function.  Please place orders for SLP. SLP Visit Diagnosis: Dysphagia, oropharyngeal phase (R13.12)    Aspiration Risk  Moderate  aspiration risk    Diet Recommendation NPO        Other  Recommendations Recommended Consults: (Consider Cognitive-Linguistic evaluation) Oral Care Recommendations: Oral care QID   Follow up Recommendations        Frequency and Duration   pending results of instrumental evaluation          Swallow Study   General Date of Onset: 02/15/18 HPI: Pt is a 72 y.o. male with history of hypertension, postural hypotension on Florinef, CKD, thrombocytopenia admitted for generalized weakness and lethargy.  Work up revealed [place].  CT 12/4: "IMPRESSION: 1. Evolving acute central cerebellar hematoma (16 cc, previously 30 cc). Regional mass effect without obstructive hydrocephalus. 2. Decreased falcotentorial and RIGHT posterior fossa subdural hematomas extending into included cervical spine. Small volume subarachnoid hemorrhage."   CXR 12/5 revealed bibasilar atelectasis but no focal consolidation. Type of Study: Bedside Swallow Evaluation Diet Prior to this Study: NPO Temperature Spikes Noted: No Respiratory Status: Nasal cannula History of Recent Intubation: Yes Length of Intubations (days): (<24 hours) Date extubated: 02/15/18 Behavior/Cognition: Alert;Cooperative Oral Cavity Assessment: Within Functional Limits Oral Care Completed by SLP: Yes Oral Cavity - Dentition: Dentures, top;Dentures, bottom Patient Positioning: Upright in bed Baseline Vocal Quality: Wet Volitional Cough: Weak Volitional Swallow: Able to elicit    Oral/Motor/Sensory Function Overall Oral Motor/Sensory Function: Moderate impairment Facial ROM: Reduced right Facial Strength: Reduced right Lingual ROM: Reduced right;Reduced left Lingual Symmetry: Abnormal symmetry right Lingual Strength: Reduced Velum: Within Functional Limits Mandible: Within Functional Limits   Ice Chips Ice chips: Within functional limits   Thin Liquid Thin Liquid: Impaired Presentation: Cup Oral Phase Impairments: Reduced labial seal Oral  Phase Functional Implications: Right anterior spillage Pharyngeal  Phase Impairments: Suspected delayed Swallow;Multiple swallows;Wet Vocal Quality;Throat Clearing - Immediate;Cough - Immediate    Nectar Thick Nectar Thick Liquid: Not tested   Honey Thick Honey Thick Liquid: Not tested   Puree Puree: Not tested   Solid     Solid: Not tested      Reaghan Kawa E Tniya [place] 02/15/2018,3:31 PM

## 2018-02-15 NOTE — H&P (Addendum)
NAME:  Marvin Mendez, MRN:  [id number], DOB:  [birth year], LOS: 0 ADMISSION DATE:  03/10/2018, CONSULTATION DATE:  12/5 REFERRING MD:  Dr. Ellender Hose EDP, CHIEF COMPLAINT:  ICH   Brief History   72 year old male admitted for cerebellar ICH on 12/5. Intubated in ED for airway protection.  History of present illness   Patient is encephalopathic and/or intubated. Therefore history has been obtained from chart review.  72 year old male with PMH as below, which is significant for HTN, CKD 3, anemia (b12 deficient), thrombocytopenia, and autonomic postural hypotension (on florinef). He presented to outside hospital 12/4 with complaints of general malaise. While in ED there, he became progressively lethargic and required intubated for airway protection. CT of the head demonstrated cerebellar hemorrhage with evidence of tonsillar herniation. He was transferred to [facility] ED for neurosurgical evaluation. He was then seen by Dr. Ellene Route in ED, and was deemed to not be a surgical candidate and perhaps even a futile situation. PCCM asked to admit.   Of note. Family reports he is a very active man. Works out several times per week. Is very strong and independent.   Past Medical History   has a past medical history of Anemia, Hypertension, and Renal disorder.  Significant Hospital Events   12/5 - admitted  Consults:  Neurosurgery - 12/5 Neurology - 12/5  Procedures:  ETT 12/4 >  Significant Diagnostic Tests:  CT head 12/4 > acute IPH in the central cerebellum with estimated volume 29cc. Assocaited edema and mass effect with probable early herniation. Subarachnoid extension. Possible intraventricular extension into fourth ventricle.  CT head 12/5 >>>  Micro Data:    Antimicrobials:    Interim history/subjective:    Objective   Blood pressure 126/76, pulse 95, temperature (!) 95 F (35 C), resp. rate 18, SpO2 98 %.    Vent Mode: PRVC FiO2 (%):  [30 %] 30 % Set Rate:  [15 bmp] 15 bmp Vt  Set:  [450 mL-640 mL] 640 mL PEEP:  [5 cmH20] 5 cmH20 Plateau Pressure:  [19 cmH20] 19 cmH20  No intake or output data in the 24 hours ending 02/15/18 0026 There were no vitals filed for this visit.  Examination: General: Elderly male on vent HENT: [place]/AT. Pupils pinpoint. No JVD Lungs: Clear bilateral breath sounds Cardiovascular: RRR, no MRG Abdomen: Soft, non-distended Extremities: No acute deformity.  Neuro: Mental status waxes and wanes. Currently sedated with RASS -3 to -4. Was moving all four extremities and following commands prior.  GU: Foley  Resolved Hospital Problem list     Assessment & Plan:   Intracerebral Hemorrhage: Cerebellar bleed with subarachnoid and possibly intraventricular extension. He is reportedly on plavix and is receiving platelets in the ED. I do not see plavix in the medical record, but I have not been able to speak to the patient as he is intubated. He has been seen by neurosurgery and is not a surgical candidate.  - Admit to ICU - Tight BP control SBP max goal 140-160 mmHg - Platelets given in ED - Cardene ordered, but has been turned off.  - I have discussed the case with neurology who recommend starting hypertonic saline and they will see the patient in consultation.  - Holding plavix, will need to find out if he is actually prescribed this.   Inability to protect airway:  Obtunded on initial ER presentation, but now has woken up and followed commands at times.  - Full vent support tonight - Sedation  with PRN fentanyl/versed for RASS 0 to -1 - Evaluate for extubation in the morning.  - VAP bundle - CXR - ABG  CKD III: Creatinine on admision 1.55. No baseline to compare.  - Watch renal function closely as he will likely require contrasted studies at some point.   Postural hypotension - Takes florinef every other day. Will hold for now. Resume should he become hypotensive.   Hyperglycemia without history of DM - CBG monitoring and  SSI  Best practice:  Diet: NPO Pain/Anxiety/Delirium protocol (if indicated): Fentanyl, versed both PRN. RASS goal 0 to -1.  VAP protocol (if indicated): Y DVT prophylaxis: SCDs GI prophylaxis: Protonix Glucose control: SSI Mobility: BR Code Status: FULL Family Communication: sister and grandsons updated. His nephew is an oncologist who family would like updated tomorrow.  Disposition: ICU, critically ill.   Labs   CBC: Recent Labs  Lab 02/17/2018 2349 03/07/2018 2355  WBC 9.5  --   NEUTROABS 7.3  --   HGB 12.4* 13.9  HCT 40.3 41.0  MCV 96.9  --   PLT 147*  --     Basic Metabolic Panel: Recent Labs  Lab 03/10/2018 2355  NA 139  K 3.8  CL 112*  GLUCOSE 194*  BUN 22  CREATININE 1.50*   GFR: CrCl cannot be calculated (Unknown ideal weight.). Recent Labs  Lab 03/07/2018 2349  WBC 9.5    Liver Function Tests: No results for input(s): AST, ALT, ALKPHOS, BILITOT, PROT, ALBUMIN in the last 168 hours. No results for input(s): LIPASE, AMYLASE in the last 168 hours. No results for input(s): AMMONIA in the last 168 hours.  ABG    Component Value Date/Time   TCO2 16 (L) 02/25/2018 2355     Coagulation Profile: Recent Labs  Lab 02/28/2018 2349  INR 1.08    Cardiac Enzymes: No results for input(s): CKTOTAL, CKMB, CKMBINDEX, TROPONINI in the last 168 hours.  HbA1C: No results found for: HGBA1C  CBG: No results for input(s): GLUCAP in the last 168 hours.  Review of Systems:   unable  Past Medical History  He,  has a past medical history of Anemia, Hypertension, and Renal disorder.   Surgical History   History reviewed. No pertinent surgical history.   Social History      Family History   His family history is not on file.   Allergies Allergies  Allergen Reactions  . Midodrine   . Aspirin Hives  . Codeine Hives     Home Medications  Prior to Admission medications   Not on File     Critical care time: 35 mins     Georgann Housekeeper,  AGACNP-BC [place] Pager [phone] or [phone]  02/15/2018 1:14 AM

## 2018-02-15 NOTE — Progress Notes (Signed)
STROKE TEAM PROGRESS NOTE   SUBJECTIVE (INTERVAL HISTORY) His sister and grandson are at the bedside.  Overall his condition is stable. He was extubated this morning but now still has difficulty handling secretions. Will order breathing treatment and chest PT. On 3% saline, will need central line. Na 137, not at goal.    OBJECTIVE Temp:  [95 F (35 C)-98.3 F (36.8 C)] 98.2 F (36.8 C) (12/05 1200) Pulse Rate:  [74-96] 90 (12/05 1200) Cardiac Rhythm: Normal sinus rhythm (12/05 1200) Resp:  [11-24] 19 (12/05 1200) BP: (101-163)/(67-94) 139/87 (12/05 1200) SpO2:  [98 %-100 %] 99 % (12/05 1200) FiO2 (%):  [30 %] 30 % (12/05 0820) Weight:  [91 kg] 91 kg (12/05 0200)  Recent Labs  Lab 02/15/18 0134 02/15/18 0344 02/15/18 0835 02/15/18 1204  GLUCAP 170* 171* 142* 120*   Recent Labs  Lab 02/21/2018 2349 02/13/2018 2355 02/15/18 0326  NA 139 139 137  K 4.1 3.8 4.5  CL 110 112* 108  CO2 16*  --  16*  GLUCOSE 190* 194* 182*  BUN 20 22 21   CREATININE 1.55* 1.50* 1.50*  CALCIUM 8.8*  --  8.8*  MG  --   --  2.0  PHOS  --   --  2.6   No results for input(s): AST, ALT, ALKPHOS, BILITOT, PROT, ALBUMIN in the last 168 hours. Recent Labs  Lab 03/12/2018 2349 02/13/2018 2355 02/15/18 0326  WBC 9.5  --  8.4  NEUTROABS 7.3  --   --   HGB 12.4* 13.9 12.1*  HCT 40.3 41.0 36.9*  MCV 96.9  --  94.9  PLT 147*  --  138*   No results for input(s): CKTOTAL, CKMB, CKMBINDEX, TROPONINI in the last 168 hours. Recent Labs    02/26/2018 2349  LABPROT 13.9  INR 1.08   No results for input(s): COLORURINE, LABSPEC, PHURINE, GLUCOSEU, HGBUR, BILIRUBINUR, KETONESUR, PROTEINUR, UROBILINOGEN, NITRITE, LEUKOCYTESUR in the last 72 hours.  Invalid input(s): APPERANCEUR  No results found for: CHOL, TRIG, HDL, CHOLHDL, VLDL, LDLCALC Lab Results  Component Value Date   HGBA1C 5.5 02/18/2018   No results found for: LABOPIA, COCAINSCRNUR, LABBENZ, AMPHETMU, THCU, LABBARB  No results for input(s): ETH in  the last 168 hours.  I have personally reviewed the radiological images below and agree with the radiology interpretations.  Ct Head Wo Contrast  Result Date: 02/15/2018 CLINICAL DATA:  Altered mental status. Follow-up intracranial hemorrhage. EXAM: CT HEAD WITHOUT CONTRAST TECHNIQUE: Contiguous axial images were obtained from the base of the skull through the vertex without intravenous contrast. COMPARISON:  CT HEAD February 14, 2018 at 1815 hours FINDINGS: BRAIN: Central cerebellar 2.8 x 3.3 x 3.3 cm (volume = 16 cc) intraparenchymal hematoma was 30 cc. Subdural extension with decreased 2-3 mm falcotentorial subdural hematoma. RIGHT posterior fossa subdural hematoma measuring to 4 mm tracking into the included cervical spine. Effaced supra cerebellar cistern. Regional mass effect resulting in narrowed fourth ventricle without hydrocephalus. Trace subarachnoid hemorrhage. No acute large vascular territory infarct. VASCULAR: Trace calcific atherosclerosis of the carotid siphons. SKULL: No skull fracture. No significant scalp soft tissue swelling. SINUSES/ORBITS: Mild paranasal sinus mucosal thickening. Mastoid air cells are well aerated.The included ocular globes and orbital contents are non-suspicious. OTHER: None. IMPRESSION: 1. Evolving acute central cerebellar hematoma (16 cc, previously 30 cc). Regional mass effect without obstructive hydrocephalus. 2. Decreased falcotentorial and RIGHT posterior fossa subdural hematomas extending into included cervical spine. Small volume subarachnoid hemorrhage. Electronically Signed   By: Elon Alas  M.D.   On: 02/15/2018 01:16   Dg Chest Port 1 View  Result Date: 02/15/2018 CLINICAL DATA:  72 year old male status post intubation. EXAM: PORTABLE CHEST 1 VIEW COMPARISON:  Earlier radiograph dated 03/13/2018 FINDINGS: Endotracheal tube above the carina in similar position. Interval placement of an enteric tube which appears to extend below the diaphragm with tip  in the left upper quadrant likely in the gastric fundus. There is shallow inspiration with bibasilar atelectasis. Mild eventration of the left hemidiaphragm. No focal consolidation, pleural effusion, or pneumothorax. Stable cardiac silhouette. No acute osseous pathology. IMPRESSION: Interval placement of an enteric tube with tip likely in the gastric fundus. No other interval change. Electronically Signed   By: Anner Crete M.D.   On: 02/15/2018 01:18   CTA head and neck pending  TTE pending   PHYSICAL EXAM  Temp:  [95 F (35 C)-98.3 F (36.8 C)] 98.2 F (36.8 C) (12/05 1200) Pulse Rate:  [74-96] 90 (12/05 1200) Resp:  [11-24] 19 (12/05 1200) BP: (101-163)/(67-94) 139/87 (12/05 1200) SpO2:  [98 %-100 %] 99 % (12/05 1200) FiO2 (%):  [30 %] 30 % (12/05 0820) Weight:  [91 kg] 91 kg (12/05 0200)  General - Well nourished, well developed, in mild respiratory distress.  Ophthalmologic - fundi not visualized due to noncooperation.  Cardiovascular - Regular rate and rhythm.  Neuro -extubated this morning, very lethargic, eyes open on voice, able to follow simple commands.  PERRL, b/l abduction not complete with nystagums b/l gaze and bidirectional.  Visual field full.  Right facial droop, tongue midline.  Facial sensation symmetrical.  Moving all extremities symmetrically, sensation symmetrical.  Right arm finger-to-nose mild ataxia.  Heel-to-shin not corporative.  Gait not tested.   ASSESSMENT/PLAN Marvin Mendez is a 72 y.o. male with history of hypertension, postural hypotension on Florinef, CKD, thrombocytopenia admitted for generalized weakness and lethargy started 5:30 PM 03/11/2018. No tPA given due to [place].    ICH:  right cerebellum and cerebellar vermis ICH with right posterior fossa SDH  Resultant lethargy, nystagmus, right arm ataxia, respiratory distress  CT head cerebellar ICH with SDH  Repeat CT showed decreased right cerebellum and vermis ICH, stable right posterior  fossa SDH  Consider MRI in am with contrast once Cre improving to rule out mass  CTA head and neck pending  2D Echo  pending  LDL pending  HgbA1c 5.5  SCDs for VTE prophylaxis  NPO  No antithrombotic prior to admission, now on No antithrombotic.   Ongoing aggressive stroke risk factor management  Therapy recommendations:  Pending   Disposition:  Pending   Respiratory distress  Intubated on admission  Extubated this am  Pt still has mild raspy distress with difficulty handling secretions  Will start breathing treatment  Chest PT  CCM on board, consider reintubation if needed  Cerebral edema  Posterior fossa mass-effect  No hydrocephalus on CT so far  CT head and neck pending  On 3% saline  Sodium 137  Sodium goal 150-155  Sodium check every 6 hours  We will give 23.4 once central line placed  Discussed with Dr. Hale Bogus for central line placement  Hypertension . Stable  BP goal < 140  On low dose cleviprex   Consider po BP meds once pass swallow  Hyperlipidemia  Home meds:  none   LDL pending, goal < 70  No statin at this time  CKD  Creatinine 1.5->1.5  On IV fluid  BMP monitoring  We will consider MRI  with contrast once creatinine improving  Other Stroke Risk Factors  Advanced age  Other Active Problems  Hyperglycemia  Postural hypotension on Florinef at home  Thrombocytopenia platelet 147-> 138  Hospital day # 0  This patient is critically ill due to cerebellar ICH, posterior fossa SDH, cerebral edema, respiratory distress hypertensive emergency and at significant risk of neurological worsening, death form hematoma expansion, cerebral edema, brain herniation, seizure, respiratory failure. This patient's care requires constant monitoring of vital signs, hemodynamics, respiratory and cardiac monitoring, review of multiple databases, neurological assessment, discussion with family, other specialists and medical decision  making of high complexity. I spent 40 minutes of neurocritical care time in the care of this patient. I had long discussion with sister and grandson at bedside, updated pt current condition, treatment plan and potential prognosis. They expressed understanding and appreciation.  I also discussed with Dr. Loanne Drilling over the phone.  Marvin Hawking, MD PhD Stroke Neurology 02/15/2018 12:26 PM    To contact Stroke Continuity provider, please refer to [email or web address] After hours, contact General Neurology

## 2018-02-15 NOTE — ED Notes (Signed)
Pt restless, agitated in bed; following commands, answers yes/no questions; continues to complain about catheter, which has been repositioned

## 2018-02-15 NOTE — Progress Notes (Signed)
Transported pt with RN to 4N ICU RM 28 without any complications. Report given to RT for the Unit.

## 2018-02-15 NOTE — Progress Notes (Addendum)
Patient ID: Marvin Mendez, male   DOB: June 12, 1945, 72 y.o.   MRN: [id number] Patient is awake extubated and follows commands.  His speech is very garbled and his swallowing function is obviously of question.  He appears much better than he did yesterday even.  Will likely need a long path of rehabilitation but I do not believe at the current time he will require any surgical intervention.  Patient swallow study is likely to show significant aspiration potential and this is likely to persist for some time.  May ultimately required a gastrostomy tube early on.  CTA performed today demonstrates presence of an 11 mm left cerebellar AVM which is most likely the source of this patient's bleeding.  This may explain his current good clinical course.  Will ultimately need to have this treated.

## 2018-02-15 NOTE — Progress Notes (Signed)
CPT not able to be performed at this time due to patient having sterile procedure in progress.

## 2018-02-15 NOTE — ED Notes (Signed)
500cc NS bolus given per CCM. Cardene stopped

## 2018-02-15 NOTE — Procedures (Signed)
Central Venous Catheter Insertion Procedure Note Marvin Mendez [id number] 1946-01-06  Procedure: Insertion of Central Venous Catheter Indications: Assessment of intravascular volume, Drug and/or fluid administration and Frequent blood sampling  Procedure Details Consent: Risks of procedure as well as the alternatives and risks of each were explained to the (patient/caregiver).  Consent for procedure obtained. Time Out: Verified patient identification, verified procedure, site/side was marked, verified correct patient position, special equipment/implants available, medications/allergies/relevent history reviewed, required imaging and test results available.  Performed  Maximum sterile technique was used including antiseptics, cap, gloves, gown, hand hygiene, mask and sheet. Skin prep: Chlorhexidine; local anesthetic administered A antimicrobial bonded/coated triple lumen catheter was placed in the left internal jugular vein to 18 cm using the Seldinger technique.  Sutured x3, biopatch applied.   Evaluation Blood flow good Complications: No apparent complications Patient did tolerate procedure well. Chest X-ray ordered to verify placement.  CXR: pending.   Procedure performed under direct supervision of Dr. Loanne Drilling and with ultrasound guidance for real time vessel cannulation.

## 2018-02-15 NOTE — Procedures (Signed)
Extubation Procedure Note  Patient Details:   Name: OSAMA COLESON DOB: 1945/09/30 MRN: [id number]   Airway Documentation:    Vent end date: 02/15/18 Vent end time: 1030   Evaluation  O2 sats: stable throughout Complications: No apparent complications Patient did tolerate procedure well. Bilateral Breath Sounds: Clear   No   Patient was extubated to a 4L [place]. Cuff leak was heard. No stridor was noted. Patient did not speak after extubation but he was following commands and had a strong cough. RN and family were at the bedside with RT during extubation.  Renato Gails [employer] [facility] 02/15/2018, 11:48 AM

## 2018-02-15 NOTE — Consult Note (Signed)
Reason for Consult: Cerebellar hemorrhage Referring Physician: Dr. Colin Rhein, [facility]  Marvin Mendez is an 72 y.o. male.  [name] is a 72 year old male who late this afternoon was not feeling well and presented that his sister's house.  It was noted there that he seemed to be weak on his right side and ultimately EMS brought him to [facility].  There he was intubated and the CT scan was performed he had a large cerebellar hemorrhage which despite some motion artifact suggested that he may be developing early hydrocephalus.  I was told at that time that he had been on Plavix.  We discussed giving him 2 units of fresh frozen plasma and noted that generally the prognosis is very poor from these types of hemorrhages.  After his discussions with the family they wished for him to be transferred to [facility].  My initial evaluation was around 7 PM by phone with his initial CAT scan was at 6:15 PM.  He arrived at [facility] at 11 PM.  On my evaluation here he had been sedated with Versed for the trip but on walking into the room he would open his eyes to voice he did follow commands with his left and his right hands he noted that he had discomfort in the area of his penis lift his head off the bed to turn left and right without too much difficulty.  Light of his significantly improved neurologic status compared to what I expected I advised we should repeat the CT scan and then had a discussion with the family regarding conservative versus aggressive care.  Repeat CT scan demonstrates that the hemorrhage has remained essentially unchanged the fourth ventricle is visible and there is no evidence of hydrocephalus at this time he appears to be handling the volume of blood in his posterior fossa fairly well  I discussed with the family that we would be admitting him to be maintained on a critical care service will give him some fresh frozen plasma 3 leave the effects of his Plavix.  We will  follow him clinically and make a decision as to whether he needs surgery and when the timing of that would be done best.  Past Medical History:  Diagnosis Date  . Anemia   . Hypertension   . Renal disorder     History reviewed. No pertinent surgical history.  History reviewed. No pertinent family history.  Social History:  has no tobacco, alcohol, and drug history on file.  Allergies:  Allergies  Allergen Reactions  . Midodrine   . Aspirin Hives  . Codeine Hives    Medications: I have reviewed the patient's current medications.  Results for orders placed or performed during the hospital encounter of 02/24/2018 (from the past 48 hour(s))  Type and screen [place]     Status: None (Preliminary result)   Collection Time: 03/09/2018 11:48 PM  Result Value Ref Range   ABO/RH(D) O POS    Antibody Screen PENDING    Sample Expiration      02/17/2018 Performed at [facility], [place] [address], [place], [place] [address]   CBC with Differential     Status: Abnormal   Collection Time: 02/15/2018 11:49 PM  Result Value Ref Range   WBC 9.5 4.0 - 10.5 K/uL   RBC 4.16 (L) 4.22 - 5.81 MIL/uL   Hemoglobin 12.4 (L) 13.0 - 17.0 g/dL   HCT 40.3 39.0 - 52.0 %   MCV 96.9  80.0 - 100.0 fL   MCH 29.8 26.0 - 34.0 pg   MCHC 30.8 30.0 - 36.0 g/dL   RDW 12.6 11.5 - 15.5 %   Platelets 147 (L) 150 - 400 K/uL   nRBC 0.0 0.0 - 0.2 %   Neutrophils Relative % 76 %   Neutro Abs 7.3 1.7 - 7.7 K/uL   Lymphocytes Relative 11 %   Lymphs Abs 1.1 0.7 - 4.0 K/uL   Monocytes Relative 8 %   Monocytes Absolute 0.8 0.1 - 1.0 K/uL   Eosinophils Relative 4 %   Eosinophils Absolute 0.3 0.0 - 0.5 K/uL   Basophils Relative 0 %   Basophils Absolute 0.0 0.0 - 0.1 K/uL   Immature Granulocytes 1 %   Abs Immature Granulocytes 0.05 0.00 - 0.07 K/uL    Comment: Performed at [facility] [address] [place], [place] [address]  Basic metabolic panel     Status: Abnormal   Collection  Time: 03/03/2018 11:49 PM  Result Value Ref Range   Sodium 139 135 - 145 mmol/L   Potassium 4.1 3.5 - 5.1 mmol/L   Chloride 110 98 - 111 mmol/L   CO2 16 (L) 22 - 32 mmol/L   Glucose, Bld 190 (H) 70 - 99 mg/dL   BUN 20 8 - 23 mg/dL   Creatinine, Ser 1.55 (H) 0.61 - 1.24 mg/dL   Calcium 8.8 (L) 8.9 - 10.3 mg/dL   GFR calc non Af Amer 43 (L) >60 mL/min   GFR calc Af Amer 50 (L) >60 mL/min   Anion gap 13 5 - 15    Comment: Performed at [facility] [address] [place], [place] [address]  Protime-INR     Status: None   Collection Time: 02/27/2018 11:49 PM  Result Value Ref Range   Prothrombin Time 13.9 11.4 - 15.2 seconds   INR 1.08     Comment: Performed at [facility] [address], [place], [place] [address]  Prepare fresh frozen plasma     Status: None (Preliminary result)   Collection Time: 02/27/2018 11:51 PM  Result Value Ref Range   Unit Number [id number]    Blood Component Type THAWED PLASMA    Unit division 00    Status of Unit ALLOCATED    Transfusion Status OK TO TRANSFUSE    Unit Number [id number]    Blood Component Type THAWED PLASMA    Unit division 00    Status of Unit ALLOCATED    Transfusion Status OK TO TRANSFUSE   I-Stat Chem 8, ED     Status: Abnormal   Collection Time: 02/15/2018 11:55 PM  Result Value Ref Range   Sodium 139 135 - 145 mmol/L   Potassium 3.8 3.5 - 5.1 mmol/L   Chloride 112 (H) 98 - 111 mmol/L   BUN 22 8 - 23 mg/dL   Creatinine, Ser 1.50 (H) 0.61 - 1.24 mg/dL   Glucose, Bld 194 (H) 70 - 99 mg/dL   Calcium, Ion 1.04 (L) 1.15 - 1.40 mmol/L   TCO2 16 (L) 22 - 32 mmol/L   Hemoglobin 13.9 13.0 - 17.0 g/dL   HCT 41.0 39.0 - 52.0 %    No results found.  Review of Systems  Unable to perform ROS: Acuity of condition   Blood pressure 126/76, pulse 95, temperature (!) 95 F (35 C), resp. rate 18, SpO2 98 %. Physical Exam  Constitutional: He is oriented to person, place, and time. He  appears well-developed and  well-nourished.  HENT:  Head: Normocephalic and atraumatic.  Eyes:  Nipples are 3 mm and briskly reactive extraocular movements appear full face is symmetric to grimace despite the presence of the tube  Neck: Normal range of motion. Neck supple.  Cardiovascular: Normal rate and regular rhythm.  Respiratory: Effort normal.  GI: Soft.  Neurological: He is alert and oriented to person, place, and time.  Intubated moves all 4 extremities weakly to command.  Skin: Skin is warm and dry.    Assessment/Plan: Cerebellar hemorrhage without hydrocephalus Plan: Observe and treat conservatively for now reversed vacs of Plavix blood thinning with 2 units of fresh frozen plasma.  Discussed with critical care who will do the admission on this patient.  Throat consult in the morning  Earleen Newport 02/15/2018, 12:33 AM

## 2018-02-15 NOTE — Progress Notes (Signed)
Transported pt to CT and back to [facility] with RN without any complications. RT to cont to monitor.

## 2018-02-16 ENCOUNTER — Inpatient Hospital Stay (HOSPITAL_COMMUNITY): Payer: PPO

## 2018-02-16 DIAGNOSIS — I370 Nonrheumatic pulmonary valve stenosis: Secondary | ICD-10-CM

## 2018-02-16 DIAGNOSIS — J9601 Acute respiratory failure with hypoxia: Secondary | ICD-10-CM

## 2018-02-16 DIAGNOSIS — I615 Nontraumatic intracerebral hemorrhage, intraventricular: Secondary | ICD-10-CM

## 2018-02-16 DIAGNOSIS — R1312 Dysphagia, oropharyngeal phase: Secondary | ICD-10-CM

## 2018-02-16 DIAGNOSIS — Q2730 Arteriovenous malformation, site unspecified: Secondary | ICD-10-CM

## 2018-02-16 LAB — LIPID PANEL
Cholesterol: 134 mg/dL (ref 0–200)
HDL: 43 mg/dL (ref >40)
LDL Cholesterol: 76 mg/dL (ref 0–99)
Total CHOL/HDL Ratio: 3.1 RATIO
Triglycerides: 74 mg/dL (ref <150)
VLDL: 15 mg/dL (ref 0–40)

## 2018-02-16 LAB — BASIC METABOLIC PANEL
Anion gap: 7 (ref 5–15)
BUN: 14 mg/dL (ref 8–23)
CHLORIDE: 121 mmol/L — AB (ref 98–111)
CO2: 20 mmol/L — ABNORMAL LOW (ref 22–32)
Calcium: 8.8 mg/dL — ABNORMAL LOW (ref 8.9–10.3)
Creatinine, Ser: 1.34 mg/dL — ABNORMAL HIGH (ref 0.61–1.24)
GFR calc Af Amer: >60 mL/min (ref >60)
GFR calc non Af Amer: 52 mL/min — ABNORMAL LOW (ref >60)
Glucose, Bld: 121 mg/dL — ABNORMAL HIGH (ref 70–99)
Potassium: 3.9 mmol/L (ref 3.5–5.1)
Sodium: 148 mmol/L — ABNORMAL HIGH (ref 135–145)

## 2018-02-16 LAB — GLUCOSE, CAPILLARY
Glucose-Capillary: 111 mg/dL — ABNORMAL HIGH (ref 70–99)
Glucose-Capillary: 93 mg/dL (ref 70–99)
Glucose-Capillary: 96 mg/dL (ref 70–99)
Glucose-Capillary: 135 mg/dL — ABNORMAL HIGH (ref 70–99)
Glucose-Capillary: 137 mg/dL — ABNORMAL HIGH (ref 70–99)

## 2018-02-16 LAB — BPAM FFP
BLOOD PRODUCT EXPIRATION DATE: 201912092359
Blood Product Expiration Date: 201912092359
ISSUE DATE / TIME: 201912050040
ISSUE DATE / TIME: 201912050245
Unit Type and Rh: 6200
Unit Type and Rh: 6200

## 2018-02-16 LAB — PREPARE FRESH FROZEN PLASMA
Unit division: 0
Unit division: 0

## 2018-02-16 LAB — SODIUM
SODIUM: 152 mmol/L — AB (ref 135–145)
Sodium: 150 mmol/L — ABNORMAL HIGH (ref 135–145)
Sodium: 153 mmol/L — ABNORMAL HIGH (ref 135–145)

## 2018-02-16 LAB — ECHOCARDIOGRAM COMPLETE
Height: 72 in
Weight: 3333.36 oz

## 2018-02-16 LAB — PHOSPHORUS: Phosphorus: 1.5 mg/dL — ABNORMAL LOW (ref 2.5–4.6)

## 2018-02-16 MED ORDER — METOPROLOL TARTRATE 50 MG PO TABS
50.0000 mg | ORAL_TABLET | Freq: Two times a day (BID) | ORAL | Status: DC
  Administered 2018-02-16 – 2018-02-25 (×15): 50 mg via ORAL
  Filled 2018-02-16 (×15): qty 1

## 2018-02-16 MED ORDER — SODIUM CHLORIDE 3 % IN NEBU
4.0000 mL | INHALATION_SOLUTION | Freq: Four times a day (QID) | RESPIRATORY_TRACT | Status: DC
  Administered 2018-02-16 – 2018-02-17 (×8): 4 mL via RESPIRATORY_TRACT
  Filled 2018-02-16 (×9): qty 4

## 2018-02-16 MED ORDER — ORAL CARE MOUTH RINSE
15.0000 mL | Freq: Two times a day (BID) | OROMUCOSAL | Status: DC
  Administered 2018-02-16 – 2018-02-18 (×3): 15 mL via OROMUCOSAL

## 2018-02-16 MED ORDER — SODIUM PHOSPHATES 45 MMOLE/15ML IV SOLN
30.0000 mmol | Freq: Once | INTRAVENOUS | Status: AC
  Administered 2018-02-16: 30 mmol via INTRAVENOUS
  Filled 2018-02-16: qty 10

## 2018-02-16 MED ORDER — AMLODIPINE BESYLATE 10 MG PO TABS
10.0000 mg | ORAL_TABLET | Freq: Every day | ORAL | Status: DC
  Administered 2018-02-16 – 2018-02-28 (×12): 10 mg via ORAL
  Filled 2018-02-16 (×12): qty 1

## 2018-02-16 MED ORDER — RESOURCE THICKENUP CLEAR PO POWD: ORAL | Status: DC | PRN

## 2018-02-16 MED ORDER — PANTOPRAZOLE SODIUM 40 MG PO TBEC
40.0000 mg | DELAYED_RELEASE_TABLET | Freq: Every day | ORAL | Status: DC
  Administered 2018-02-16 – 2018-02-17 (×2): 40 mg via ORAL
  Filled 2018-02-16 (×2): qty 1

## 2018-02-16 NOTE — Progress Notes (Signed)
PT Cancellation Note  Patient Details Name: Marvin Mendez MRN: [id number] DOB: [birth year]   Cancelled Treatment:    Reason Eval/Treat Not Completed: Patient at procedure or test/unavailable Attempted to see pt earlier but getting a bath, attempted to see x2 but getting chest PT. Will follow up as time allows.   [name] 02/16/2018, 11:31 AM Wray Kearns, PT, DPT Acute Rehabilitation Services Pager [phone] Office [phone]

## 2018-02-16 NOTE — Progress Notes (Signed)
Modified Barium Swallow Progress Note  Patient Details  Name: BUCKY GRIGG MRN: [id number] Date of Birth: [birth year]  Today's Date: 02/16/2018  Modified Barium Swallow completed.  Full report located under Chart Review in the Imaging Section.  Brief recommendations include the following:  Clinical Impression  Pt demonstrates a mild oral dysphagia and moderate pharyngeal dysphagia characterized by right labial and lingual weakness though lingual propulsion and mastication of liquids and soft soldis appear adequate with only mild residue and anterior spillage. Pharyngeal impairment includes intermittently decreased movement of the base of tongue and upper pharyngeal constrictors for bolus propulsion leading to vallecular residuals ranging from severe to mild. Larger bolus size does appear to trigger best propulsion. A head turn left was also significantly better at reducing vallecular residuals than any other posture. Pt senses residue and sometimes his ineffective efforts to clear appear like gagging. Recommend dys 2 (fine chip) and nectar thick liquids as thin liquids did result in silent aspiration before the swallow with larger boluses. Pts cough is not effective to clear. Will follow closely for tolerance.    Swallow Evaluation Recommendations       SLP Diet Recommendations: Dysphagia 2 (Fine chop) solids;Nectar thick liquid   Liquid Administration via: Straw   Medication Administration: Crushed with puree   Supervision: Staff to assist with self feeding   Compensations: Slow rate;Small sips/bites;Minimize environmental distractions   Postural Changes: (Head turn left)   Oral Care Recommendations: Oral care BID   Other Recommendations: Have oral suction available   Herbie [place], MA [place] Pager [phone] Office [phone]  Malya Cirillo, Katherene Ponto 02/16/2018,1:52 PM

## 2018-02-16 NOTE — Evaluation (Signed)
Physical Therapy Evaluation Patient Details Name: Marvin Mendez MRN: [id number] DOB: 1946-02-25 Today's Date: 02/16/2018   History of Present Illness  Pt is a 72 y.o. male admitted to outside hospital for malaise, CTA showed probable herniation of tonsils, R posterior fossa subdural hematoma with mass effect, R SAH, L central cerebellar AVM which is suspected source of bleeding. CXR bibasilar atelectasis. Pt intubated 12/4-12/5. PMH: HTN, CKD, B12 anemia, autonomic postural hypotension.   Clinical Impression  Pt admitted with above diagnosis. Pt presents with truncal ataxia, L>R extremity ataxia, and confusion limiting ability to perform bed mobility, transfer and amb independently and safely. Pt min guard for static sitting balance with VC to use B UE for support. Heavy posterior lean requiring modA for dynamic sitting balance. Sit to stand with maxA +2, knee buckling, truncal ataxia and LOB right. SpO2 maintained above 92% on 3L/min [place], RR up to 25. Pt has notable dysarthria. PTA, pt was independent and active in community, niece and her husband present throughout session. Pt will benefit from skilled PT to increase his independence and safety with mobility to allow discharge to CIR.  Supine: BP 162/72 HR 96 bpm EOB: BP 156/84 HR 98 bpm S/p session: BP 164/89 HR 108 bpm    Follow Up Recommendations CIR    Equipment Recommendations  None recommended by PT    Recommendations for Other Services       Precautions / Restrictions Precautions Precautions: Fall Precaution Comments: BP <140 Restrictions Weight Bearing Restrictions: No      Mobility  Bed Mobility Overal bed mobility: Needs Assistance Bed Mobility: Supine to Sit     Supine to sit: Min assist;HOB elevated     General bed mobility comments: Min assist to progress legs towards EOB and power trunk upright. Supine 162/72, 96 bpm. Sitting EOB 156/84 mmHg, 98 bpm.   Transfers Overall transfer level: Needs  assistance Equipment used: None Transfers: Sit to/from Stand Sit to Stand: Max assist;+2 physical assistance         General transfer comment: B knee buckle, truncal ataxia, LOB towards R. 164/89 mmHg, 108 bpm s/p transfer, pt in supine. RR up to 25, SpO2 98% on 3L [place]  Ambulation/Gait                Stairs            Wheelchair Mobility    Modified Rankin (Stroke Patients Only) Modified Rankin (Stroke Patients Only) Pre-Morbid Rankin Score: No symptoms Modified Rankin: Moderately severe disability     Balance Overall balance assessment: Needs assistance Sitting-balance support: Bilateral upper extremity supported Sitting balance-Leahy Scale: Poor Sitting balance - Comments: Min-modA for sitting balance unless cued to support self, VC to support self with UE for brief periods of time. Truncal ataxia noted, posterior lean, reaches arms out laterally to support trunk, posterior lean. Postural control: Right lateral lean Standing balance support: During functional activity Standing balance-Leahy Scale: Zero                               Pertinent Vitals/Pain Faces Pain Scale: No hurt    Home Living Family/patient expects to be discharged to:: Private residence Living Arrangements: Other relatives Available Help at Discharge: Family Type of Home: House Home Access: Stairs to enter Entrance Stairs-Rails: None Entrance Stairs-Number of Steps: 4 Home Layout: One level Home Equipment: None      Prior Function Level of Independence: Independent  Comments: Per neice, pt was very active in community and exercises multiple times a week     Hand Dominance   Dominant Hand: Right    Extremity/Trunk Assessment   Upper Extremity Assessment Upper Extremity Assessment: RUE deficits/detail;LUE deficits/detail RUE Deficits / Details: significant dysmetria (L>R), 5/5 strength LUE Deficits / Details: dysmetria, 5/5 strength    Lower  Extremity Assessment Lower Extremity Assessment: LLE deficits/detail;RLE deficits/detail RLE Deficits / Details: Decreased sensation on toes, inconsistent reports, otherwise WNL. 1/5 DF, 5/5 hip flexion, quad, hamstring, PF LLE Deficits / Details: 5/5 hip flexion, quad, hamsting, DF, PF LLE Sensation: WNL       Communication   Communication: Expressive difficulties  Cognition Arousal/Alertness: Lethargic Behavior During Therapy: WFL for tasks assessed/performed Overall Cognitive Status: Impaired/Different from baseline Area of Impairment: Attention;Memory;Following commands;Awareness                   Current Attention Level: Sustained Memory: Decreased short-term memory Following Commands: Follows one step commands consistently;Follows multi-step commands inconsistently       General Comments: A&Ox3. Pt trying to pull  out but when adjusted for pt and told he needed to keep it on he left it alone. Not aware off why he is in the hospital besides he "went out," stated he knew that when told he had a CVA.       General Comments General comments (skin integrity, edema, etc.): Smooth pursuits absent. Pt has notable dysarthria. Niece and niece's husband present throughout session. Neice said they would bring pt's glasses for next PT visit and leave in cabinet.     Exercises     Assessment/Plan    PT Assessment Patient needs continued PT services  PT Problem List Decreased strength;Decreased coordination;Cardiopulmonary status limiting activity;Decreased cognition;Decreased activity tolerance;Decreased balance;Decreased safety awareness;Decreased mobility       PT Treatment Interventions Gait training;Therapeutic exercise;Patient/family education;Therapeutic activities;Cognitive remediation;DME instruction;Stair training;Balance training;Functional mobility training;Neuromuscular re-education    PT Goals (Current goals can be found in the Care Plan section)  Acute Rehab PT  Goals Patient Stated Goal: non stated    Frequency Min 4X/week   Barriers to discharge        Co-evaluation               AM-PAC PT "6 Clicks" Mobility  Outcome Measure Help needed turning from your back to your side while in a flat bed without using bedrails?: A Little Help needed moving from lying on your back to sitting on the side of a flat bed without using bedrails?: A Little Help needed moving to and from a bed to a chair (including a wheelchair)?: A Lot Help needed standing up from a chair using your arms (e.g., wheelchair or bedside chair)?: A Lot Help needed to walk in hospital room?: A Lot Help needed climbing 3-5 steps with a railing? : Total 6 Click Score: 13    End of Session Equipment Utilized During Treatment: Gait belt Activity Tolerance: Patient limited by lethargy;Other (comment) Patient left: in bed;with bed alarm set;with family/visitor present;with call bell/phone within reach Nurse Communication: Mobility status PT Visit Diagnosis: Other symptoms and signs involving the nervous system (R29.898);Muscle weakness (generalized) (M62.81);Other abnormalities of gait and mobility (R26.89)    Time: 9924-2683 PT Time Calculation (min) (ACUTE ONLY): 23 min   Charges:   PT Evaluation $PT Eval Moderate Complexity: 1 Mod PT Treatments $Therapeutic Activity: 8-22 mins       Gilda Crease, SPT Acute Rehab Services [phone]   [facility]  Benoit Meech 02/16/2018, 4:56 PM

## 2018-02-16 NOTE — Progress Notes (Addendum)
STROKE TEAM PROGRESS NOTE   SUBJECTIVE (INTERVAL HISTORY) His niece and nephrew in law are at the bedside.  Overall his condition is stable. He tolerated extubation, on breathing treatment and NT suctioning. Pt this morning initially sleepy but easily arousable and following all commands. Has central line and still on 3% saline.    OBJECTIVE Temp:  [97.5 F (36.4 C)-99.2 F (37.3 C)] 98 F (36.7 C) (12/06 0800) Pulse Rate:  [83-96] 93 (12/06 0800) Cardiac Rhythm: Normal sinus rhythm (12/06 0800) Resp:  [12-34] 20 (12/06 0800) BP: (114-154)/(69-115) 145/87 (12/06 0800) SpO2:  [94 %-100 %] 97 % (12/06 0800) Weight:  [94.5 kg] 94.5 kg (12/06 0450)  Recent Labs  Lab 02/15/18 1204 02/15/18 1637 02/15/18 1934 02/15/18 2315 02/16/18 0316  GLUCAP 120* 115* 112* 100* 111*   Recent Labs  Lab 02/19/2018 2349 02/20/2018 2355 02/15/18 0326 02/15/18 1301 02/15/18 1700 02/15/18 2024 02/16/18 0408  NA 139 139 137 144 146* 146* 148*  K 4.1 3.8 4.5  --   --   --  3.9  CL 110 112* 108  --   --   --  121*  CO2 16*  --  16*  --   --   --  20*  GLUCOSE 190* 194* 182*  --   --   --  121*  BUN 20 22 21   --   --   --  14  CREATININE 1.55* 1.50* 1.50*  --   --   --  1.34*  CALCIUM 8.8*  --  8.8*  --   --   --  8.8*  MG  --   --  2.0  --   --   --   --   PHOS  --   --  2.6  --   --   --  1.5*   No results for input(s): AST, ALT, ALKPHOS, BILITOT, PROT, ALBUMIN in the last 168 hours. Recent Labs  Lab 03/12/2018 2349 02/11/2018 2355 02/15/18 0326  WBC 9.5  --  8.4  NEUTROABS 7.3  --   --   HGB 12.4* 13.9 12.1*  HCT 40.3 41.0 36.9*  MCV 96.9  --  94.9  PLT 147*  --  138*   No results for input(s): CKTOTAL, CKMB, CKMBINDEX, TROPONINI in the last 168 hours. Recent Labs    02/19/2018 2349  LABPROT 13.9  INR 1.08   No results for input(s): COLORURINE, LABSPEC, PHURINE, GLUCOSEU, HGBUR, BILIRUBINUR, KETONESUR, PROTEINUR, UROBILINOGEN, NITRITE, LEUKOCYTESUR in the last 72 hours.  Invalid  input(s): APPERANCEUR     Component Value Date/Time   CHOL 134 02/16/2018 0408   TRIG 74 02/16/2018 0408   HDL 43 02/16/2018 0408   CHOLHDL 3.1 02/16/2018 0408   VLDL 15 02/16/2018 0408   LDLCALC 76 02/16/2018 0408   Lab Results  Component Value Date   HGBA1C 5.5 02/28/2018   No results found for: LABOPIA, COCAINSCRNUR, LABBENZ, AMPHETMU, THCU, LABBARB  No results for input(s): ETH in the last 168 hours.  I have personally reviewed the radiological images below and agree with the radiology interpretations.  Ct Angio Head W Or Wo Contrast  Result Date: 02/15/2018 CLINICAL DATA:  71 y/o  M; follow-up of intracranial hemorrhage. EXAM: CT ANGIOGRAPHY HEAD AND NECK TECHNIQUE: Multidetector CT imaging of the head and neck was performed using the standard protocol during bolus administration of intravenous contrast. Multiplanar CT image reconstructions and MIPs were obtained to evaluate the vascular anatomy. Carotid stenosis measurements (when applicable) are  obtained utilizing NASCET criteria, using the distal internal carotid diameter as the denominator. CONTRAST:  12mL ISOVUE-370 IOPAMIDOL (ISOVUE-370) INJECTION 76% COMPARISON:  03/02/2018 CT head. FINDINGS: CTA NECK FINDINGS Aortic arch: Standard branching. Imaged portion shows no evidence of aneurysm or dissection. No significant stenosis of the major arch vessel origins. Right carotid system: No evidence of dissection, stenosis (50% or greater) or occlusion. Left carotid system: No evidence of dissection, stenosis (50% or greater) or occlusion. Vertebral arteries: Codominant. No evidence of dissection, stenosis (50% or greater) or occlusion. Skeleton: Mild-to-moderate cervical spondylosis with multilevel disc and facet degenerative changes. No high-grade bony canal stenosis. Other neck: Negative. Upper chest: Negative. Review of the MIP images confirms the above findings CTA HEAD FINDINGS Anterior circulation: No significant stenosis, proximal  occlusion, aneurysm, or vascular malformation. Posterior circulation: Mild stenosis of the left vertebral artery at the left PICA origin. The left PICA asymmetrically enlarged with a branch extending to a cluster of vessels within the posteromedial aspect of the left cerebellar hemisphere spanning approximately 11 mm (series 12, image [id number] and series 13, image 105, also see sagittal MIPS). The right vertebral, basilar, and bilateral posterior cerebral arteries are unremarkable. Venous sinuses: Hyperdense prominent venous structure within the medial cerebellum, possibly a draining vein from the left cerebellar vascular malformation (series 12, image 22). Anatomic variants: None significant. Delayed phase: No abnormal intracranial enhancement. Review of the MIP images confirms the above findings IMPRESSION: CTA head: 1. Small group of abnormal vessels within the posteromedial aspect of left cerebellar hemisphere spanning approximately 11 mm, suspected arteriovenous malformation. 2. Mild stenosis of left vertebral artery and PICA origin. 3. No additional aneurysm, stenosis, vascular malformation, or occlusion of the anterior and posterior circulation. CTA neck: Patent carotid and vertebral arteries of the neck. No dissection, aneurysm, or hemodynamically significant stenosis by NASCET criteria. These results will be called to the ordering clinician or representative by the Radiologist Assistant, and communication documented in the PACS or zVision Dashboard. Electronically Signed   By: Kristine Garbe M.D.   On: 02/15/2018 19:17   Ct Head Wo Contrast  Result Date: 02/15/2018 CLINICAL DATA:  Altered mental status. Follow-up intracranial hemorrhage. EXAM: CT HEAD WITHOUT CONTRAST TECHNIQUE: Contiguous axial images were obtained from the base of the skull through the vertex without intravenous contrast. COMPARISON:  CT HEAD February 14, 2018 at 1815 hours FINDINGS: BRAIN: Central cerebellar 2.8 x 3.3 x 3.3  cm (volume = 16 cc) intraparenchymal hematoma was 30 cc. Subdural extension with decreased 2-3 mm falcotentorial subdural hematoma. RIGHT posterior fossa subdural hematoma measuring to 4 mm tracking into the included cervical spine. Effaced supra cerebellar cistern. Regional mass effect resulting in narrowed fourth ventricle without hydrocephalus. Trace subarachnoid hemorrhage. No acute large vascular territory infarct. VASCULAR: Trace calcific atherosclerosis of the carotid siphons. SKULL: No skull fracture. No significant scalp soft tissue swelling. SINUSES/ORBITS: Mild paranasal sinus mucosal thickening. Mastoid air cells are well aerated.The included ocular globes and orbital contents are non-suspicious. OTHER: None. IMPRESSION: 1. Evolving acute central cerebellar hematoma (16 cc, previously 30 cc). Regional mass effect without obstructive hydrocephalus. 2. Decreased falcotentorial and RIGHT posterior fossa subdural hematomas extending into included cervical spine. Small volume subarachnoid hemorrhage. Electronically Signed   By: Elon Alas M.D.   On: 02/15/2018 01:16   Ct Angio Neck W Or Wo Contrast  Result Date: 02/15/2018 CLINICAL DATA:  72 y/o  M; follow-up of intracranial hemorrhage. EXAM: CT ANGIOGRAPHY HEAD AND NECK TECHNIQUE: Multidetector CT imaging of the head and neck  was performed using the standard protocol during bolus administration of intravenous contrast. Multiplanar CT image reconstructions and MIPs were obtained to evaluate the vascular anatomy. Carotid stenosis measurements (when applicable) are obtained utilizing NASCET criteria, using the distal internal carotid diameter as the denominator. CONTRAST:  26mL ISOVUE-370 IOPAMIDOL (ISOVUE-370) INJECTION 76% COMPARISON:  02/12/2018 CT head. FINDINGS: CTA NECK FINDINGS Aortic arch: Standard branching. Imaged portion shows no evidence of aneurysm or dissection. No significant stenosis of the major arch vessel origins. Right carotid  system: No evidence of dissection, stenosis (50% or greater) or occlusion. Left carotid system: No evidence of dissection, stenosis (50% or greater) or occlusion. Vertebral arteries: Codominant. No evidence of dissection, stenosis (50% or greater) or occlusion. Skeleton: Mild-to-moderate cervical spondylosis with multilevel disc and facet degenerative changes. No high-grade bony canal stenosis. Other neck: Negative. Upper chest: Negative. Review of the MIP images confirms the above findings CTA HEAD FINDINGS Anterior circulation: No significant stenosis, proximal occlusion, aneurysm, or vascular malformation. Posterior circulation: Mild stenosis of the left vertebral artery at the left PICA origin. The left PICA asymmetrically enlarged with a branch extending to a cluster of vessels within the posteromedial aspect of the left cerebellar hemisphere spanning approximately 11 mm (series 12, image [id number] and series 13, image 105, also see sagittal MIPS). The right vertebral, basilar, and bilateral posterior cerebral arteries are unremarkable. Venous sinuses: Hyperdense prominent venous structure within the medial cerebellum, possibly a draining vein from the left cerebellar vascular malformation (series 12, image 22). Anatomic variants: None significant. Delayed phase: No abnormal intracranial enhancement. Review of the MIP images confirms the above findings IMPRESSION: CTA head: 1. Small group of abnormal vessels within the posteromedial aspect of left cerebellar hemisphere spanning approximately 11 mm, suspected arteriovenous malformation. 2. Mild stenosis of left vertebral artery and PICA origin. 3. No additional aneurysm, stenosis, vascular malformation, or occlusion of the anterior and posterior circulation. CTA neck: Patent carotid and vertebral arteries of the neck. No dissection, aneurysm, or hemodynamically significant stenosis by NASCET criteria. These results will be called to the ordering clinician or  representative by the Radiologist Assistant, and communication documented in the PACS or zVision Dashboard. Electronically Signed   By: Kristine Garbe M.D.   On: 02/15/2018 19:17   Dg Chest Port 1 View  Result Date: 02/16/2018 CLINICAL DATA:  Respiratory failure EXAM: PORTABLE CHEST 1 VIEW COMPARISON:  02/15/2018 FINDINGS: Left jugular central line is again identified and stable. Cardiac shadow is stable. Increasing left basilar infiltrate with likely small effusion is noted. No pneumothorax is seen. Mild right basilar atelectasis is seen. No bony abnormality is noted. IMPRESSION: Increasing left basilar infiltrate with small effusion. Increasing right basilar atelectasis. Electronically Signed   By: Inez Catalina M.D.   On: 02/16/2018 07:34   Dg Chest Port 1 View  Result Date: 02/15/2018 CLINICAL DATA:  Central line placement. EXAM: PORTABLE CHEST 1 VIEW COMPARISON:  Tool 07/2017 FINDINGS: Left internal jugular approach central venous catheter terminates in the expected location of the proximal superior vena cava. The patient has been extubated. Enlarged cardiac silhouette.  Left lower lobe airspace consolidation No evidence of pneumothorax. Elevation of the left hemidiaphragm with airspace consolidation versus atelectasis in the left lower lobe. Left pleural effusion is not excluded. Osseous structures are without acute abnormality. Soft tissues are grossly normal. IMPRESSION: Status post placement of left internal jugular approach central venous catheter with tip in the expected location of the proximal superior vena cava. No evidence of pneumothorax. Electronically Signed  By: Fidela Salisbury M.D.   On: 02/15/2018 17:06   Dg Chest Port 1 View  Result Date: 02/15/2018 CLINICAL DATA:  72 year old male status post intubation. EXAM: PORTABLE CHEST 1 VIEW COMPARISON:  Earlier radiograph dated 02/16/2018 FINDINGS: Endotracheal tube above the carina in similar position. Interval placement of  an enteric tube which appears to extend below the diaphragm with tip in the left upper quadrant likely in the gastric fundus. There is shallow inspiration with bibasilar atelectasis. Mild eventration of the left hemidiaphragm. No focal consolidation, pleural effusion, or pneumothorax. Stable cardiac silhouette. No acute osseous pathology. IMPRESSION: Interval placement of an enteric tube with tip likely in the gastric fundus. No other interval change. Electronically Signed   By: Anner Crete M.D.   On: 02/15/2018 01:18    TTE pending   PHYSICAL EXAM  Temp:  [97.5 F (36.4 C)-99.2 F (37.3 C)] 98 F (36.7 C) (12/06 0800) Pulse Rate:  [83-96] 93 (12/06 0800) Resp:  [12-34] 20 (12/06 0800) BP: (114-154)/(69-115) 145/87 (12/06 0800) SpO2:  [94 %-100 %] 97 % (12/06 0800) Weight:  [94.5 kg] 94.5 kg (12/06 0450)  General - Well nourished, well developed, in slight respiratory distress with secretions.  Ophthalmologic - fundi not visualized due to noncooperation.  Cardiovascular - Regular rate and rhythm.  Neuro - mildly lethargic, initially sleepy but easily arousable and eyes open, able to follow all simple commands.  PERRL, b/l abduction not complete with improved nystagums b/l gaze and bidirectional.  Visual field full.  Right facial droop, tongue midline.  Facial sensation symmetrical.  Severe dysarthria. Moving all extremities symmetrically, sensation symmetrical.  Right arm finger-to-nose mild ataxia.  Heel-to-shin not corporative.  Gait not tested.   ASSESSMENT/PLAN Mr. FRANCES JOYNT is a 72 y.o. male with history of hypertension, postural hypotension on Florinef, CKD, thrombocytopenia admitted for generalized weakness and lethargy started 5:30 PM 02/16/2018. No tPA given due to [place].    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 Ellene Route and he will follow up to repair AVM once pt  stable  Resultant lethargy, nystagmus, right arm ataxia, respiratory distress  CT head cerebellar ICH with SDH  Repeat CT showed decreased right cerebellum and vermis ICH, stable right posterior fossa SDH  MRI with and without contrast pending  CTA head and neck concerning left cerebellar AVM - Dr. Ellene Route will take care of it once pt stable  2D Echo  pending  LDL 76  HgbA1c 5.5  SCDs for VTE prophylaxis  NPO  No antithrombotic prior to admission, now on No antithrombotic.   Ongoing aggressive stroke risk factor management  Therapy recommendations:  Pending   Disposition:  Pending   Respiratory distress  Intubated on admission  Extubated 02/15/18  Pt still has mild respiratory distress with difficulty handling secretions  On breathing treatment  Chest PT  CCM on board  Cerebral edema  Posterior fossa mass-effect  No hydrocephalus on CT so far  CT head and neck showed stable hematoma and no hydro  MRI brain pending today  On 3% saline @ 75  Sodium 137->146->148  Sodium goal 150-155  Sodium check every 6 hours  Has central line placement  Hypertension . Stable  BP goal < 140  On low dose cleviprex   Consider po BP meds once pass swallow  Hyperlipidemia  Home meds:  none   LDL 76, goal < 70  No statin at this time  Dysphagia  Did not pass swallow  MBS today  If not passing swallow, may consider cortrak today  CKD  Creatinine 1.5->1.5->1.34  On IV fluid  BMP monitoring  Other Stroke Risk Factors  Advanced age  Other Active Problems  Hyperglycemia, improved  Postural hypotension on Florinef at home  Thrombocytopenia platelet 147-> 138  Hospital day # 1  This patient is critically ill due to cerebellar ICH, posterior fossa SDH, cerebral edema, respiratory distress hypertensive emergency and at significant risk of neurological worsening, death form hematoma expansion, cerebral edema, brain herniation, seizure,  respiratory failure. This patient's care requires constant monitoring of vital signs, hemodynamics, respiratory and cardiac monitoring, review of multiple databases, neurological assessment, discussion with family, other specialists and medical decision making of high complexity. I spent 35 minutes of neurocritical care time in the care of this patient. I had long discussion with niece and nephrew in law at bedside, updated pt current condition, treatment plan and potential prognosis. They expressed understanding and appreciation.   Rosalin Hawking, MD PhD Stroke Neurology 02/16/2018 9:56 AM    To contact Stroke Continuity provider, please refer to [email or web address] After hours, contact General Neurology

## 2018-02-16 NOTE — Progress Notes (Signed)
MRI called.  MRI staffed verbal that they will call when there is an opening since they have lots of STAT MRI.  RN will continue to monitor.

## 2018-02-16 NOTE — Progress Notes (Signed)
NAME:  Marvin Mendez, MRN:  [id number], DOB:  October 05, 1945, LOS: 1 ADMISSION DATE:  02/13/2018, CONSULTATION DATE:  12/5 REFERRING MD:  Dr. Ellender Hose EDP, CHIEF COMPLAINT:  ICH   Brief History   72 year old male admitted for cerebellar ICH on 12/5. Intubated in ED for airway protection.  History of present illness   Patient is encephalopathic and/or intubated. Therefore history has been obtained from chart review.  72 year old male with PMH as below, which is significant for HTN, CKD 3, anemia (b12 deficient), thrombocytopenia, and autonomic postural hypotension (on florinef). He presented to outside hospital 12/4 with complaints of general malaise. While in ED there, he became progressively lethargic and required intubated for airway protection. CT of the head demonstrated cerebellar hemorrhage with evidence of tonsillar herniation. He was transferred to [facility] ED for neurosurgical evaluation. He was then seen by Dr. Ellene Route in ED, and was deemed to not be a surgical candidate and perhaps even a futile situation. PCCM asked to admit.   Of note. Family reports he is a very active man. Works out several times per week. Is very strong and independent.   Past Medical History   has a past medical history of Anemia, Hypertension, and Renal disorder.  Significant Hospital Events   12/5 - admitted  Consults:  Neurosurgery - 12/5 Neurology - 12/5  Procedures:  ETT 12/4 > 02/15/2018  Significant Diagnostic Tests:  CT head 12/4 > acute IPH in the central cerebellum with estimated volume 29cc. Assocaited edema and mass effect with probable early herniation. Subarachnoid extension. Possible intraventricular extension into fourth ventricle.  CT head 12/5 >>> with decreased central cerebellar hemorrhage to 15 cc.  Micro Data:    Antimicrobials:    Interim history/subjective:    Objective   Blood pressure (!) 145/87, pulse 93, temperature 98 F (36.7 C), resp. rate 20, height 6' (1.829 m),  weight 94.5 kg, SpO2 97 %.        Intake/Output Summary (Last 24 hours) at 02/16/2018 0933 Last data filed at 02/16/2018 0900 Gross per 24 hour  Intake 2383.49 ml  Output 1750 ml  Net 633.49 ml   Filed Weights   02/15/18 0200 02/16/18 0450  Weight: 91 kg 94.5 kg    Examination: General: Well-nourished well-developed male no acute distress HEENT: No JVD or lymphadenopathy is appreciated Neuro: Somewhat stunned, follows commands moves all extremities x4 CV: Heart sounds are regular PULM: even/non-labored, lungs bilaterally bases YP:PJKD, non-tender, bsx4 active  Extremities: warm/dry, 1+ edema  Skin: no rashes or lesions    Resolved Hospital Problem list     Assessment & Plan:   Intracerebral Hemorrhage: Cerebellar bleed with subarachnoid and possibly intraventricular extension. He is reportedly on plavix and is receiving platelets in the ED. I do not see plavix in the medical record, but I have not been able to speak to the patient as he is intubated. He has been seen by neurosurgery and is not a surgical candidate.  -Continue to monitor intensive care unit -Appreciate neurology neurosurgery's input -Continue me systolic blood pressure less than 140 - 3% saline per neurology -Remains intensive care unit while on 3% saline  Inability to protect airway:  Obtunded on initial ER presentation, but now has woken up and followed commands at times.  02/17/2015 awake alert follows commands -Extubated 02/15/2018  -Pulmonary toilet  -2 as needed t  CKD III: Creatinine on admision 1.55. No baseline to compare.  02/15/2018 creatinine is 1.50.  02/16/2018 creatinine  1.34 -Monitor renal function -Continue 3% saline per neurology  Postural hypotension -Currently on Florinef, continue to monitor blood pressure -  Hyperglycemia without history of DM -Sliding scale insulin protocol  Best practice:  Diet: NPO, plan to advance once extubated Pain/Anxiety/Delirium protocol not indicated  at this time VAP protocol (if indicated): N DVT prophylaxis: SCDs GI prophylaxis: Protonix Glucose control: SSI Mobility: Increase mobility Code Status: FULL Family Communication: sister and grandsons updated.  02/16/2018 family updated bedside. Disposition: Remains intensive care unit, extubated 02/15/2018, currently on 3% saline for increasing cerebral edema  Labs   CBC: Recent Labs  Lab 02/27/2018 2349 02/21/2018 2355 02/15/18 0326  WBC 9.5  --  8.4  NEUTROABS 7.3  --   --   HGB 12.4* 13.9 12.1*  HCT 40.3 41.0 36.9*  MCV 96.9  --  94.9  PLT 147*  --  138*    Basic Metabolic Panel: Recent Labs  Lab 02/20/2018 2349 02/13/2018 2355 02/15/18 0326 02/15/18 1301 02/15/18 1700 02/15/18 2024 02/16/18 0408  NA 139 139 137 144 146* 146* 148*  K 4.1 3.8 4.5  --   --   --  3.9  CL 110 112* 108  --   --   --  121*  CO2 16*  --  16*  --   --   --  20*  GLUCOSE 190* 194* 182*  --   --   --  121*  BUN 20 22 21   --   --   --  14  CREATININE 1.55* 1.50* 1.50*  --   --   --  1.34*  CALCIUM 8.8*  --  8.8*  --   --   --  8.8*  MG  --   --  2.0  --   --   --   --   PHOS  --   --  2.6  --   --   --  1.5*   GFR: Estimated Creatinine Clearance: 57.7 mL/min (A) (by C-G formula based on SCr of 1.34 mg/dL (H)). Recent Labs  Lab 02/12/2018 2349 02/15/18 0326  WBC 9.5 8.4    Liver Function Tests: No results for input(s): AST, ALT, ALKPHOS, BILITOT, PROT, ALBUMIN in the last 168 hours. No results for input(s): LIPASE, AMYLASE in the last 168 hours. No results for input(s): AMMONIA in the last 168 hours.  ABG    Component Value Date/Time   PHART 7.270 (L) 02/15/2018 0122   PCO2ART 40.6 02/15/2018 0122   PO2ART 402.0 (H) 02/15/2018 0122   HCO3 18.6 (L) 02/15/2018 0122   TCO2 20 (L) 02/15/2018 0122   ACIDBASEDEF 8.0 (H) 02/15/2018 0122   O2SAT 100.0 02/15/2018 0122     Coagulation Profile: Recent Labs  Lab 02/23/2018 2349  INR 1.08    Cardiac Enzymes: No results for input(s):  CKTOTAL, CKMB, CKMBINDEX, TROPONINI in the last 168 hours.  HbA1C: Hgb A1c MFr Bld  Date/Time Value Ref Range Status  02/28/2018 11:49 PM 5.5 4.8 - 5.6 % Final    Comment:    (NOTE) Pre diabetes:          5.7%-6.4% Diabetes:              >6.4% Glycemic control for   <7.0% adults with diabetes     CBG: Recent Labs  Lab 02/15/18 1204 02/15/18 1637 02/15/18 1934 02/15/18 2315 02/16/18 0316  GLUCAP 120* 115* 112* 100* 111*     Critical care time: 35 mins  Richardson Landry Minor ACNP Maryanna Shape PCCM Pager [phone] till 1 pm If no answer page [phone] 02/16/2018, 9:33 AM

## 2018-02-16 NOTE — Progress Notes (Signed)
  Echocardiogram 2D Echocardiogram has been performed.  Jennette Dubin 02/16/2018, 11:04 AM

## 2018-02-17 ENCOUNTER — Inpatient Hospital Stay (HOSPITAL_COMMUNITY): Payer: PPO

## 2018-02-17 DIAGNOSIS — G936 Cerebral edema: Secondary | ICD-10-CM

## 2018-02-17 DIAGNOSIS — Q282 Arteriovenous malformation of cerebral vessels: Secondary | ICD-10-CM

## 2018-02-17 LAB — CBC
HEMATOCRIT: 36 % — AB (ref 39.0–52.0)
Hemoglobin: 11.4 g/dL — ABNORMAL LOW (ref 13.0–17.0)
MCH: 31 pg (ref 26.0–34.0)
MCHC: 31.7 g/dL (ref 30.0–36.0)
MCV: 97.8 fL (ref 80.0–100.0)
Platelets: 95 10*3/uL — ABNORMAL LOW (ref 150–400)
RBC: 3.68 MIL/uL — ABNORMAL LOW (ref 4.22–5.81)
RDW: 13.7 % (ref 11.5–15.5)
WBC: 12 10*3/uL — ABNORMAL HIGH (ref 4.0–10.5)
nRBC: 0.2 % (ref 0.0–0.2)

## 2018-02-17 LAB — BASIC METABOLIC PANEL
Anion gap: 8 (ref 5–15)
BUN: 14 mg/dL (ref 8–23)
CO2: 20 mmol/L — ABNORMAL LOW (ref 22–32)
CREATININE: 1.62 mg/dL — AB (ref 0.61–1.24)
Calcium: 8.8 mg/dL — ABNORMAL LOW (ref 8.9–10.3)
Chloride: 128 mmol/L — ABNORMAL HIGH (ref 98–111)
GFR calc Af Amer: 48 mL/min — ABNORMAL LOW (ref >60)
GFR calc non Af Amer: 41 mL/min — ABNORMAL LOW (ref >60)
Glucose, Bld: 131 mg/dL — ABNORMAL HIGH (ref 70–99)
Potassium: 4.2 mmol/L (ref 3.5–5.1)
Sodium: 156 mmol/L — ABNORMAL HIGH (ref 135–145)

## 2018-02-17 LAB — SODIUM
Sodium: 154 mmol/L — ABNORMAL HIGH (ref 135–145)
Sodium: 156 mmol/L — ABNORMAL HIGH (ref 135–145)
Sodium: 157 mmol/L — ABNORMAL HIGH (ref 135–145)
Sodium: 159 mmol/L — ABNORMAL HIGH (ref 135–145)

## 2018-02-17 LAB — GLUCOSE, CAPILLARY
Glucose-Capillary: 113 mg/dL — ABNORMAL HIGH (ref 70–99)
Glucose-Capillary: 117 mg/dL — ABNORMAL HIGH (ref 70–99)
Glucose-Capillary: 106 mg/dL — ABNORMAL HIGH (ref 70–99)
Glucose-Capillary: 135 mg/dL — ABNORMAL HIGH (ref 70–99)
Glucose-Capillary: 110 mg/dL — ABNORMAL HIGH (ref 70–99)

## 2018-02-17 LAB — PHOSPHORUS: Phosphorus: 2.1 mg/dL — ABNORMAL LOW (ref 2.5–4.6)

## 2018-02-17 LAB — MAGNESIUM: Magnesium: 2 mg/dL (ref 1.7–2.4)

## 2018-02-17 MED ORDER — HALOPERIDOL LACTATE 5 MG/ML IJ SOLN
5.0000 mg | Freq: Once | INTRAMUSCULAR | Status: AC
  Administered 2018-02-17: 5 mg via INTRAVENOUS

## 2018-02-17 MED ORDER — HALOPERIDOL LACTATE 5 MG/ML IJ SOLN
5.0000 mg | Freq: Once | INTRAMUSCULAR | Status: AC
  Administered 2018-02-17: 5 mg via INTRAVENOUS
  Filled 2018-02-17: qty 1

## 2018-02-17 MED ORDER — HALOPERIDOL LACTATE 5 MG/ML IJ SOLN: 2.0000 mg | Freq: Once | INTRAMUSCULAR | Status: DC

## 2018-02-17 MED ORDER — HALOPERIDOL LACTATE 5 MG/ML IJ SOLN
INTRAMUSCULAR | Status: AC
  Filled 2018-02-17: qty 1

## 2018-02-17 MED ORDER — GADOBUTROL 1 MMOL/ML IV SOLN
9.0000 mL | Freq: Once | INTRAVENOUS | Status: AC | PRN
  Administered 2018-02-17: 9 mL via INTRAVENOUS

## 2018-02-17 NOTE — Progress Notes (Signed)
Subjective: Patient reports doing well still has headache but no worse denies any nausea reportedly agitated overnight  Objective: Vital signs in last 24 hours: Temp:  [98.2 F (36.8 C)-99.2 F (37.3 C)] 99.2 F (37.3 C) (12/06 1924) Pulse Rate:  [81-101] 85 (12/07 0730) Resp:  [15-35] 27 (12/07 0730) BP: (96-162)/(51-107) 138/99 (12/07 0730) SpO2:  [93 %-100 %] 100 % (12/07 0730)  Intake/Output from previous day: 12/06 0701 - 12/07 0700 In: 2487.1 [I.V.:2268.1; IV Piggyback:219.1] Out: 855 [Urine:855] Intake/Output this shift: No intake/output data recorded.  awake and alert nonfocal agitated  Lab Results: Recent Labs    02/15/18 0326 02/17/18 0523  WBC 8.4 12.0*  HGB 12.1* 11.4*  HCT 36.9* 36.0*  PLT 138* 95*   BMET Recent Labs    02/16/18 0408  02/17/18 0149 02/17/18 0523  NA 148*   < > 154* 156*  K 3.9  --   --  4.2  CL 121*  --   --  128*  CO2 20*  --   --  20*  GLUCOSE 121*  --   --  131*  BUN 14  --   --  14  CREATININE 1.34*  --   --  1.62*  CALCIUM 8.8*  --   --  8.8*   < > = values in this interval not displayed.    Studies/Results: Ct Angio Head W Or Wo Contrast  Result Date: 02/15/2018 CLINICAL DATA:  72 y/o  M; follow-up of intracranial hemorrhage. EXAM: CT ANGIOGRAPHY HEAD AND NECK TECHNIQUE: Multidetector CT imaging of the head and neck was performed using the standard protocol during bolus administration of intravenous contrast. Multiplanar CT image reconstructions and MIPs were obtained to evaluate the vascular anatomy. Carotid stenosis measurements (when applicable) are obtained utilizing NASCET criteria, using the distal internal carotid diameter as the denominator. CONTRAST:  65mL ISOVUE-370 IOPAMIDOL (ISOVUE-370) INJECTION 76% COMPARISON:  03/10/2018 CT head. FINDINGS: CTA NECK FINDINGS Aortic arch: Standard branching. Imaged portion shows no evidence of aneurysm or dissection. No significant stenosis of the major arch vessel origins. Right  carotid system: No evidence of dissection, stenosis (50% or greater) or occlusion. Left carotid system: No evidence of dissection, stenosis (50% or greater) or occlusion. Vertebral arteries: Codominant. No evidence of dissection, stenosis (50% or greater) or occlusion. Skeleton: Mild-to-moderate cervical spondylosis with multilevel disc and facet degenerative changes. No high-grade bony canal stenosis. Other neck: Negative. Upper chest: Negative. Review of the MIP images confirms the above findings CTA HEAD FINDINGS Anterior circulation: No significant stenosis, proximal occlusion, aneurysm, or vascular malformation. Posterior circulation: Mild stenosis of the left vertebral artery at the left PICA origin. The left PICA asymmetrically enlarged with a branch extending to a cluster of vessels within the posteromedial aspect of the left cerebellar hemisphere spanning approximately 11 mm (series 12, image 157-158 and series 13, image 105, also see sagittal MIPS). The right vertebral, basilar, and bilateral posterior cerebral arteries are unremarkable. Venous sinuses: Hyperdense prominent venous structure within the medial cerebellum, possibly a draining vein from the left cerebellar vascular malformation (series 12, image 22). Anatomic variants: None significant. Delayed phase: No abnormal intracranial enhancement. Review of the MIP images confirms the above findings IMPRESSION: CTA head: 1. Small group of abnormal vessels within the posteromedial aspect of left cerebellar hemisphere spanning approximately 11 mm, suspected arteriovenous malformation. 2. Mild stenosis of left vertebral artery and PICA origin. 3. No additional aneurysm, stenosis, vascular malformation, or occlusion of the anterior and posterior circulation. CTA neck: Patent  carotid and vertebral arteries of the neck. No dissection, aneurysm, or hemodynamically significant stenosis by NASCET criteria. These results will be called to the ordering clinician  or representative by the Radiologist Assistant, and communication documented in the PACS or zVision Dashboard. Electronically Signed   By: Kristine Garbe M.D.   On: 02/15/2018 19:17   Ct Angio Neck W Or Wo Contrast  Result Date: 02/15/2018 CLINICAL DATA:  72 y/o  M; follow-up of intracranial hemorrhage. EXAM: CT ANGIOGRAPHY HEAD AND NECK TECHNIQUE: Multidetector CT imaging of the head and neck was performed using the standard protocol during bolus administration of intravenous contrast. Multiplanar CT image reconstructions and MIPs were obtained to evaluate the vascular anatomy. Carotid stenosis measurements (when applicable) are obtained utilizing NASCET criteria, using the distal internal carotid diameter as the denominator. CONTRAST:  22mL ISOVUE-370 IOPAMIDOL (ISOVUE-370) INJECTION 76% COMPARISON:  02/13/2018 CT head. FINDINGS: CTA NECK FINDINGS Aortic arch: Standard branching. Imaged portion shows no evidence of aneurysm or dissection. No significant stenosis of the major arch vessel origins. Right carotid system: No evidence of dissection, stenosis (50% or greater) or occlusion. Left carotid system: No evidence of dissection, stenosis (50% or greater) or occlusion. Vertebral arteries: Codominant. No evidence of dissection, stenosis (50% or greater) or occlusion. Skeleton: Mild-to-moderate cervical spondylosis with multilevel disc and facet degenerative changes. No high-grade bony canal stenosis. Other neck: Negative. Upper chest: Negative. Review of the MIP images confirms the above findings CTA HEAD FINDINGS Anterior circulation: No significant stenosis, proximal occlusion, aneurysm, or vascular malformation. Posterior circulation: Mild stenosis of the left vertebral artery at the left PICA origin. The left PICA asymmetrically enlarged with a branch extending to a cluster of vessels within the posteromedial aspect of the left cerebellar hemisphere spanning approximately 11 mm (series 12, image  157-158 and series 13, image 105, also see sagittal MIPS). The right vertebral, basilar, and bilateral posterior cerebral arteries are unremarkable. Venous sinuses: Hyperdense prominent venous structure within the medial cerebellum, possibly a draining vein from the left cerebellar vascular malformation (series 12, image 22). Anatomic variants: None significant. Delayed phase: No abnormal intracranial enhancement. Review of the MIP images confirms the above findings IMPRESSION: CTA head: 1. Small group of abnormal vessels within the posteromedial aspect of left cerebellar hemisphere spanning approximately 11 mm, suspected arteriovenous malformation. 2. Mild stenosis of left vertebral artery and PICA origin. 3. No additional aneurysm, stenosis, vascular malformation, or occlusion of the anterior and posterior circulation. CTA neck: Patent carotid and vertebral arteries of the neck. No dissection, aneurysm, or hemodynamically significant stenosis by NASCET criteria. These results will be called to the ordering clinician or representative by the Radiologist Assistant, and communication documented in the PACS or zVision Dashboard. Electronically Signed   By: Kristine Garbe M.D.   On: 02/15/2018 19:17   Mr Jeri Cos GG Contrast  Result Date: 02/17/2018 CLINICAL DATA:  Follow-up exam for intracranial hemorrhage. EXAM: MRI HEAD WITHOUT AND WITH CONTRAST TECHNIQUE: Multiplanar, multiecho pulse sequences of the brain and surrounding structures were obtained without and with intravenous contrast. CONTRAST:  90 cc of Gadavist. COMPARISON:  Comparison made with prior CTA from 02/15/2018 as well as previous CTs from 02/11/2018. FINDINGS: Brain: Intraparenchymal hemorrhage involving the cerebellar vermis and right cerebellar hemisphere again seen, relatively stable in size measuring approximately 3.6 x 2.9 x 3.2 cm. Localized edema with regional mass effect similar as well. Partial effacement of the fourth ventricle  and fourth ventricular outflow tract anteriorly which remain patent. Ventricular size is stable without  hydrocephalus or ventricular trapping. Slight asymmetry of the lateral ventricles with the left larger than the right noted, likely congenital. Mild mass effect on the brainstem anteriorly without frank compression. No transtentorial herniation. Probable enhancing tangle of vessels at the adjacent posterior left cerebellum suspicious for AVM (series 18, image 16, also seen on prior CTA. No other abnormal enhancement seen underlying the hematoma. Right posterior fossa of subdural hematoma relatively stable measuring up to 4 mm in thickness. Additional small subdural collection overlying the posterior right cerebral convexity measures up to 2 mm without mass effect, likely small volume subdural hemorrhage and/or reactive hygroma. Scattered small volume subarachnoid hemorrhage present within the underlying right temporal occipital region (series 13, image 30). Smooth dural enhancement overlying the right cerebral convexity likely reactive. 9 mm acute ischemic left cerebellar infarct seen adjacent to the hematoma (series 5, image 58). No other evidence for acute ischemia. Underlying cerebral volume normal. Mild chronic microvascular ischemic changes noted within the periventricular white matter. No other areas of chronic infarction. No mass lesion or abnormal enhancement elsewhere within the brain. Vascular: Major intracranial vascular flow voids maintained left cerebellar AVM measures approximately 13 x 18 mm (series 12, image 7). Skull and upper cervical spine: Craniocervical junction within normal limits. Upper cervical spine normal. Bone marrow signal intensity normal. No scalp soft tissue abnormality. Sinuses/Orbits: Globes and orbital soft tissues within normal limits. Mild scattered mucosal thickening throughout the ethmoidal air cells. Paranasal sinuses are otherwise clear. Trace right mastoid effusion noted,  of doubtful significance. Inner ear structures normal. Other: None. IMPRESSION: 1. No significant interval change in size and appearance of evolving intraparenchymal hemorrhage involving the right cerebellum and cerebellar vermis. Similar regional mass effect without hydrocephalus or herniation. 2. Associated small right posterior fossa and posterior right cerebral convexity subdural hematoma without significant mass effect, stable. Persistent small volume underlying subarachnoid hemorrhage. 3. 9 mm acute ischemic nonhemorrhagic left cerebellar infarct adjacent to the cerebellar hemorrhage. 4. Small left cerebellar AVM, presumably the source of hemorrhage. Electronically Signed   By: Jeannine Boga M.D.   On: 02/17/2018 03:39   Dg Chest Port 1 View  Result Date: 02/16/2018 CLINICAL DATA:  Respiratory failure EXAM: PORTABLE CHEST 1 VIEW COMPARISON:  02/15/2018 FINDINGS: Left jugular central line is again identified and stable. Cardiac shadow is stable. Increasing left basilar infiltrate with likely small effusion is noted. No pneumothorax is seen. Mild right basilar atelectasis is seen. No bony abnormality is noted. IMPRESSION: Increasing left basilar infiltrate with small effusion. Increasing right basilar atelectasis. Electronically Signed   By: Inez Catalina M.D.   On: 02/16/2018 07:34   Dg Chest Port 1 View  Result Date: 02/15/2018 CLINICAL DATA:  Central line placement. EXAM: PORTABLE CHEST 1 VIEW COMPARISON:  Tool 07/2017 FINDINGS: Left internal jugular approach central venous catheter terminates in the expected location of the proximal superior vena cava. The patient has been extubated. Enlarged cardiac silhouette.  Left lower lobe airspace consolidation No evidence of pneumothorax. Elevation of the left hemidiaphragm with airspace consolidation versus atelectasis in the left lower lobe. Left pleural effusion is not excluded. Osseous structures are without acute abnormality. Soft tissues are  grossly normal. IMPRESSION: Status post placement of left internal jugular approach central venous catheter with tip in the expected location of the proximal superior vena cava. No evidence of pneumothorax. Electronically Signed   By: Fidela Salisbury M.D.   On: 02/15/2018 17:06   Dg Swallowing Func-speech Pathology  Result Date: 02/16/2018 Objective Swallowing Evaluation: Type of  Study: MBS-Modified Barium Swallow Study  Patient Details Name: Marvin Mendez MRN: [id number] Date of Birth: March 06, 1946 Today's Date: 02/16/2018 Time: SLP Start Time (ACUTE ONLY): 1210 -SLP Stop Time (ACUTE ONLY): 1235 SLP Time Calculation (min) (ACUTE ONLY): 25 min Past Medical History: Past Medical History: Diagnosis Date . Anemia  . Hypertension  . Renal disorder  Past Surgical History: No past surgical history on file.  Subjective: Pt asleep, able to rouse Assessment / Plan / Recommendation CHL IP CLINICAL IMPRESSIONS 02/16/2018 Clinical Impression Pt demonstrates a mild oral dysphagia and moderate pharyngeal dysphagia characterized by right labial and lingual weakness though lingual propulsion and mastication of liquids and soft soldis appear adequate with only mild residue and anterior spillage. Pharyngeal impairment includes intermittently decreased movement of the base of tongue and upper pharyngeal constrictors for bolus propulsion leading to vallecular residuals ranging from severe to mild. Larger bolus size does appear to trigger best propulsion. A head turn left was also significantly better at reducing vallecular residuals than any other posture. Pt senses residue and sometimes his ineffective efforts to clear appear like gagging. Recommend dys 2 (fine chip) and nectar thick liquids as thin liquids did result in silent aspiration before the swallow with larger boluses. Pts cough is not effective to clear. Will follow closely for tolerance.  SLP Visit Diagnosis Dysphagia, oropharyngeal phase (R13.12) Attention and  concentration deficit following -- Frontal lobe and executive function deficit following -- Impact on safety and function --   CHL IP TREATMENT RECOMMENDATION 02/16/2018 Treatment Recommendations Therapy as outlined in treatment plan below   Prognosis 02/16/2018 Prognosis for Safe Diet Advancement Good Barriers to Reach Goals -- Barriers/Prognosis Comment -- CHL IP DIET RECOMMENDATION 02/16/2018 SLP Diet Recommendations Dysphagia 2 (Fine chop) solids;Nectar thick liquid Liquid Administration via Straw Medication Administration Crushed with puree Compensations Slow rate;Small sips/bites;Minimize environmental distractions Postural Changes (No Data)   CHL IP OTHER RECOMMENDATIONS 02/16/2018 Recommended Consults -- Oral Care Recommendations Oral care BID Other Recommendations Have oral suction available   CHL IP FOLLOW UP RECOMMENDATIONS 02/16/2018 Follow up Recommendations Inpatient Rehab   CHL IP FREQUENCY AND DURATION 02/16/2018 Speech Therapy Frequency (ACUTE ONLY) min 2x/week Treatment Duration 2 weeks      CHL IP ORAL PHASE 02/16/2018 Oral Phase Impaired Oral - Pudding Teaspoon -- Oral - Pudding Cup -- Oral - Honey Teaspoon -- Oral - Honey Cup -- Oral - Nectar Teaspoon -- Oral - Nectar Cup -- Oral - Nectar Straw Right anterior bolus loss;Weak lingual manipulation;Decreased bolus cohesion Oral - Thin Teaspoon -- Oral - Thin Cup -- Oral - Thin Straw Decreased bolus cohesion;Weak lingual manipulation;Right anterior bolus loss Oral - Puree Decreased bolus cohesion;Reduced posterior propulsion;Weak lingual manipulation;Delayed oral transit Oral - Mech Soft Decreased bolus cohesion;Reduced posterior propulsion;Weak lingual manipulation;Delayed oral transit Oral - Regular -- Oral - Multi-Consistency -- Oral - Pill -- Oral Phase - Comment --  CHL IP PHARYNGEAL PHASE 02/16/2018 Pharyngeal Phase Impaired Pharyngeal- Pudding Teaspoon -- Pharyngeal -- Pharyngeal- Pudding Cup -- Pharyngeal -- Pharyngeal- Honey Teaspoon -- Pharyngeal  -- Pharyngeal- Honey Cup NT Pharyngeal -- Pharyngeal- Nectar Teaspoon -- Pharyngeal -- Pharyngeal- Nectar Cup -- Pharyngeal -- Pharyngeal- Nectar Straw Delayed swallow initiation-pyriform sinuses;Delayed swallow initiation-vallecula;Reduced pharyngeal peristalsis;Reduced tongue base retraction;Penetration/Apiration after swallow;Trace aspiration;Pharyngeal residue - valleculae Pharyngeal Material enters airway, CONTACTS cords and not ejected out;Material does not enter airway Pharyngeal- Thin Teaspoon -- Pharyngeal -- Pharyngeal- Thin Cup -- Pharyngeal -- Pharyngeal- Thin Straw Delayed swallow initiation-pyriform sinuses;Delayed swallow initiation-vallecula;Reduced pharyngeal peristalsis;Reduced tongue base retraction;Trace  aspiration;Pharyngeal residue - valleculae;Penetration/Aspiration before swallow Pharyngeal Material enters airway, passes BELOW cords without attempt by patient to eject out (silent aspiration) Pharyngeal- Puree Delayed swallow initiation-pyriform sinuses;Delayed swallow initiation-vallecula;Reduced pharyngeal peristalsis;Reduced tongue base retraction;Pharyngeal residue - valleculae Pharyngeal Material does not enter airway Pharyngeal- Mechanical Soft Delayed swallow initiation-pyriform sinuses;Delayed swallow initiation-vallecula;Reduced pharyngeal peristalsis;Reduced tongue base retraction;Pharyngeal residue - valleculae Pharyngeal -- Pharyngeal- Regular -- Pharyngeal -- Pharyngeal- Multi-consistency -- Pharyngeal -- Pharyngeal- Pill -- Pharyngeal -- Pharyngeal Comment --  No flowsheet data found. 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 Continue observation neurologically patient remained stable  LOS: 2 days     Marvie Calender P 02/17/2018, 8:01  AM

## 2018-02-17 NOTE — Progress Notes (Signed)
  Speech Language Pathology Treatment: Dysphagia  Patient Details Name: Marvin Mendez MRN: [id number] DOB: 1945/04/21 Today's Date: 02/17/2018 Time: 6789-3810 SLP Time Calculation (min) (ACUTE ONLY): 21 min  Assessment / Plan / Recommendation Clinical Impression  Patient awake however drowsy requiring verbal stimulation to attend to eating. Nurse reported patient was agitated during night and required sedation. Observed nurse administering meds crushed in applesauce. Pt tolerated well. He tolerated Dys 2, nectar liquid consistencies well with max verbal cues to turn head left. No clearing or coughing noted with any intake. Education about compensatory strategies and aspiration risk provided to patient and grandsons present during therapy. Recommend continuing current diet with staff to provide full assistance for feeding and verbal cues for left head turn. Speech therapy to follow up for diet tolerance, swallowing strategy training, education and diet upgrades.    HPI Pt is a 72 y.o. male with history of hypertension, postural hypotension on [place]ine[place], CKD, thrombocytopenia admitted for generalized weakness and lethargy.  Work up revealed [place].  CT 12/4: "IMPRESSION: 1. Evolving acute central cerebellar hematoma (16 cc, previously 30 cc). Regional mass effect without obstructive hydrocephalus. 2. Decreased falcotentorial and RIGHT posterior fossa subdural hematomas extending into included cervical spine. Small volume subarachnoid hemorrhage."  CXR 12/5 revealed bibasilar atelectasis but no focal consolidation.       SLP Plan  Continue with current plan of care       Recommendations  Diet recommendations: Dysphagia 2 (fine chop);Nectar-thick liquid Liquids provided via: Straw;Cup Medication Administration: Crushed with puree Supervision: Full supervision/cueing for compensatory strategies Compensations: Slow rate;Small sips/bites;Minimize environmental distractions Postural Changes and/or  Swallow Maneuvers: Seated upright 90 degrees;Upright 30-60 min after meal                Oral Care Recommendations: Oral care QID Plan: Continue with current plan of care       [place], MA, CCC-SLP 02/17/2018 9:42 AM

## 2018-02-17 NOTE — Progress Notes (Signed)
eLink Physician-Brief Progress Note Patient Name: KAMONI GENTLES DOB: 1945/06/19 MRN: [id number]   Date of Service  02/17/2018  HPI/Events of Note  Agitation - QTc interval = 0.41 seconds.   eICU Interventions  Will order: 1. Haldol 5 mg IV now.       Intervention Category Major Interventions: Delirium, psychosis, severe agitation - evaluation and management  Tequila Rottmann [place] 02/17/2018, 10:54 PM

## 2018-02-17 NOTE — Progress Notes (Signed)
Neuro MD made aware of sodium of 159. No new orders.

## 2018-02-17 NOTE — Progress Notes (Signed)
STROKE TEAM PROGRESS NOTE   SUBJECTIVE (INTERVAL HISTORY) His Grandson and another family member are at the bedside.  Overall his condition is stable. He tolerated extubation, he is cleared for diet but has been coughing some. He was agitated last night and requiring sedation and is in restraints. Hie is still on  Cardene drip.He did MRI scan of the brain yesterday showed stable appearance of the cerebellar hematoma and mass effect.  OBJECTIVE Temp:  [98.2 F (36.8 C)-99.2 F (37.3 C)] 98.9 F (37.2 C) (12/07 1140) Pulse Rate:  [80-101] 82 (12/07 1400) Cardiac Rhythm: Normal sinus rhythm (12/07 0800) Resp:  [15-35] 23 (12/07 1400) BP: (96-161)/(51-107) 131/75 (12/07 1400) SpO2:  [93 %-100 %] 100 % (12/07 1400)  Recent Labs  Lab 02/16/18 1638 02/16/18 1923 02/17/18 0515 02/17/18 0830 02/17/18 1138  GLUCAP 135* 137* 113* 117* 106*   Recent Labs  Lab 02/15/2018 2349 02/25/2018 2355 02/15/18 0326  02/16/18 0408  02/16/18 1424 02/16/18 2119 02/17/18 0149 02/17/18 0523 02/17/18 0925  NA 139 139 137   < > 148*   < > 153* 152* 154* 156* 156*  K 4.1 3.8 4.5  --  3.9  --   --   --   --  4.2  --   CL 110 112* 108  --  121*  --   --   --   --  128*  --   CO2 16*  --  16*  --  20*  --   --   --   --  20*  --   GLUCOSE 190* 194* 182*  --  121*  --   --   --   --  131*  --   BUN 20 22 21   --  14  --   --   --   --  14  --   CREATININE 1.55* 1.50* 1.50*  --  1.34*  --   --   --   --  1.62*  --   CALCIUM 8.8*  --  8.8*  --  8.8*  --   --   --   --  8.8*  --   MG  --   --  2.0  --   --   --   --   --   --  2.0  --   PHOS  --   --  2.6  --  1.5*  --   --   --   --  2.1*  --    < > = values in this interval not displayed.   No results for input(s): AST, ALT, ALKPHOS, BILITOT, PROT, ALBUMIN in the last 168 hours. Recent Labs  Lab 03/03/2018 2349 02/28/2018 2355 02/15/18 0326 02/17/18 0523  WBC 9.5  --  8.4 12.0*  NEUTROABS 7.3  --   --   --   HGB 12.4* 13.9 12.1* 11.4*  HCT 40.3 41.0  36.9* 36.0*  MCV 96.9  --  94.9 97.8  PLT 147*  --  138* 95*   No results for input(s): CKTOTAL, CKMB, CKMBINDEX, TROPONINI in the last 168 hours. Recent Labs    03/13/2018 2349  LABPROT 13.9  INR 1.08   No results for input(s): COLORURINE, LABSPEC, PHURINE, GLUCOSEU, HGBUR, BILIRUBINUR, KETONESUR, PROTEINUR, UROBILINOGEN, NITRITE, LEUKOCYTESUR in the last 72 hours.  Invalid input(s): APPERANCEUR     Component Value Date/Time   CHOL 134 02/16/2018 0408   TRIG 74 02/16/2018 0408   HDL 43 02/16/2018 0408  CHOLHDL 3.1 02/16/2018 0408   VLDL 15 02/16/2018 0408   LDLCALC 76 02/16/2018 0408   Lab Results  Component Value Date   HGBA1C 5.5 02/21/2018   No results found for: LABOPIA, COCAINSCRNUR, LABBENZ, AMPHETMU, THCU, LABBARB  No results for input(s): ETH in the last 168 hours.  I have personally reviewed the radiological images below and agree with the radiology interpretations.  Ct Angio Head W Or Wo Contrast  Result Date: 02/15/2018 CLINICAL DATA:  72 y/o  M; follow-up of intracranial hemorrhage. EXAM: CT ANGIOGRAPHY HEAD AND NECK TECHNIQUE: Multidetector CT imaging of the head and neck was performed using the standard protocol during bolus administration of intravenous contrast. Multiplanar CT image reconstructions and MIPs were obtained to evaluate the vascular anatomy. Carotid stenosis measurements (when applicable) are obtained utilizing NASCET criteria, using the distal internal carotid diameter as the denominator. CONTRAST:  45mL ISOVUE-370 IOPAMIDOL (ISOVUE-370) INJECTION 76% COMPARISON:  02/19/2018 CT head. FINDINGS: CTA NECK FINDINGS Aortic arch: Standard branching. Imaged portion shows no evidence of aneurysm or dissection. No significant stenosis of the major arch vessel origins. Right carotid system: No evidence of dissection, stenosis (50% or greater) or occlusion. Left carotid system: No evidence of dissection, stenosis (50% or greater) or occlusion. Vertebral arteries:  Codominant. No evidence of dissection, stenosis (50% or greater) or occlusion. Skeleton: Mild-to-moderate cervical spondylosis with multilevel disc and facet degenerative changes. No high-grade bony canal stenosis. Other neck: Negative. Upper chest: Negative. Review of the MIP images confirms the above findings CTA HEAD FINDINGS Anterior circulation: No significant stenosis, proximal occlusion, aneurysm, or vascular malformation. Posterior circulation: Mild stenosis of the left vertebral artery at the left PICA origin. The left PICA asymmetrically enlarged with a branch extending to a cluster of vessels within the posteromedial aspect of the left cerebellar hemisphere spanning approximately 11 mm (series 12, image [id number] and series 13, image 105, also see sagittal MIPS). The right vertebral, basilar, and bilateral posterior cerebral arteries are unremarkable. Venous sinuses: Hyperdense prominent venous structure within the medial cerebellum, possibly a draining vein from the left cerebellar vascular malformation (series 12, image 22). Anatomic variants: None significant. Delayed phase: No abnormal intracranial enhancement. Review of the MIP images confirms the above findings IMPRESSION: CTA head: 1. Small group of abnormal vessels within the posteromedial aspect of left cerebellar hemisphere spanning approximately 11 mm, suspected arteriovenous malformation. 2. Mild stenosis of left vertebral artery and PICA origin. 3. No additional aneurysm, stenosis, vascular malformation, or occlusion of the anterior and posterior circulation. CTA neck: Patent carotid and vertebral arteries of the neck. No dissection, aneurysm, or hemodynamically significant stenosis by NASCET criteria. These results will be called to the ordering clinician or representative by the Radiologist Assistant, and communication documented in the PACS or zVision Dashboard. Electronically Signed   By: Kristine Garbe M.D.   On: 02/15/2018 19:17    Ct Head Wo Contrast  Result Date: 02/15/2018 CLINICAL DATA:  Altered mental status. Follow-up intracranial hemorrhage. EXAM: CT HEAD WITHOUT CONTRAST TECHNIQUE: Contiguous axial images were obtained from the base of the skull through the vertex without intravenous contrast. COMPARISON:  CT HEAD February 14, 2018 at 1815 hours FINDINGS: BRAIN: Central cerebellar 2.8 x 3.3 x 3.3 cm (volume = 16 cc) intraparenchymal hematoma was 30 cc. Subdural extension with decreased 2-3 mm falcotentorial subdural hematoma. RIGHT posterior fossa subdural hematoma measuring to 4 mm tracking into the included cervical spine. Effaced supra cerebellar cistern. Regional mass effect resulting in narrowed fourth ventricle without hydrocephalus. Trace subarachnoid  hemorrhage. No acute large vascular territory infarct. VASCULAR: Trace calcific atherosclerosis of the carotid siphons. SKULL: No skull fracture. No significant scalp soft tissue swelling. SINUSES/ORBITS: Mild paranasal sinus mucosal thickening. Mastoid air cells are well aerated.The included ocular globes and orbital contents are non-suspicious. OTHER: None. IMPRESSION: 1. Evolving acute central cerebellar hematoma (16 cc, previously 30 cc). Regional mass effect without obstructive hydrocephalus. 2. Decreased falcotentorial and RIGHT posterior fossa subdural hematomas extending into included cervical spine. Small volume subarachnoid hemorrhage. Electronically Signed   By: Elon Alas M.D.   On: 02/15/2018 01:16   Ct Angio Neck W Or Wo Contrast  Result Date: 02/15/2018 CLINICAL DATA:  72 y/o  M; follow-up of intracranial hemorrhage. EXAM: CT ANGIOGRAPHY HEAD AND NECK TECHNIQUE: Multidetector CT imaging of the head and neck was performed using the standard protocol during bolus administration of intravenous contrast. Multiplanar CT image reconstructions and MIPs were obtained to evaluate the vascular anatomy. Carotid stenosis measurements (when applicable) are  obtained utilizing NASCET criteria, using the distal internal carotid diameter as the denominator. CONTRAST:  56mL ISOVUE-370 IOPAMIDOL (ISOVUE-370) INJECTION 76% COMPARISON:  02/18/2018 CT head. FINDINGS: CTA NECK FINDINGS Aortic arch: Standard branching. Imaged portion shows no evidence of aneurysm or dissection. No significant stenosis of the major arch vessel origins. Right carotid system: No evidence of dissection, stenosis (50% or greater) or occlusion. Left carotid system: No evidence of dissection, stenosis (50% or greater) or occlusion. Vertebral arteries: Codominant. No evidence of dissection, stenosis (50% or greater) or occlusion. Skeleton: Mild-to-moderate cervical spondylosis with multilevel disc and facet degenerative changes. No high-grade bony canal stenosis. Other neck: Negative. Upper chest: Negative. Review of the MIP images confirms the above findings CTA HEAD FINDINGS Anterior circulation: No significant stenosis, proximal occlusion, aneurysm, or vascular malformation. Posterior circulation: Mild stenosis of the left vertebral artery at the left PICA origin. The left PICA asymmetrically enlarged with a branch extending to a cluster of vessels within the posteromedial aspect of the left cerebellar hemisphere spanning approximately 11 mm (series 12, image [id number] and series 13, image 105, also see sagittal MIPS). The right vertebral, basilar, and bilateral posterior cerebral arteries are unremarkable. Venous sinuses: Hyperdense prominent venous structure within the medial cerebellum, possibly a draining vein from the left cerebellar vascular malformation (series 12, image 22). Anatomic variants: None significant. Delayed phase: No abnormal intracranial enhancement. Review of the MIP images confirms the above findings IMPRESSION: CTA head: 1. Small group of abnormal vessels within the posteromedial aspect of left cerebellar hemisphere spanning approximately 11 mm, suspected arteriovenous  malformation. 2. Mild stenosis of left vertebral artery and PICA origin. 3. No additional aneurysm, stenosis, vascular malformation, or occlusion of the anterior and posterior circulation. CTA neck: Patent carotid and vertebral arteries of the neck. No dissection, aneurysm, or hemodynamically significant stenosis by NASCET criteria. These results will be called to the ordering clinician or representative by the Radiologist Assistant, and communication documented in the PACS or zVision Dashboard. Electronically Signed   By: Kristine Garbe M.D.   On: 02/15/2018 19:17   Mr Jeri Cos ZO Contrast  Result Date: 02/17/2018 CLINICAL DATA:  Follow-up exam for intracranial hemorrhage. EXAM: MRI HEAD WITHOUT AND WITH CONTRAST TECHNIQUE: Multiplanar, multiecho pulse sequences of the brain and surrounding structures were obtained without and with intravenous contrast. CONTRAST:  90 cc of Gadavist. COMPARISON:  Comparison made with prior CTA from 02/15/2018 as well as previous CTs from 02/25/2018. FINDINGS: Brain: Intraparenchymal hemorrhage involving the cerebellar vermis and right cerebellar hemisphere again seen,  relatively stable in size measuring approximately 3.6 x 2.9 x 3.2 cm. Localized edema with regional mass effect similar as well. Partial effacement of the fourth ventricle and fourth ventricular outflow tract anteriorly which remain patent. Ventricular size is stable without hydrocephalus or ventricular trapping. Slight asymmetry of the lateral ventricles with the left larger than the right noted, likely congenital. Mild mass effect on the brainstem anteriorly without frank compression. No transtentorial herniation. Probable enhancing tangle of vessels at the adjacent posterior left cerebellum suspicious for AVM (series 18, image 16, also seen on prior CTA. No other abnormal enhancement seen underlying the hematoma. Right posterior fossa of subdural hematoma relatively stable measuring up to 4 mm in  thickness. Additional small subdural collection overlying the posterior right cerebral convexity measures up to 2 mm without mass effect, likely small volume subdural hemorrhage and/or reactive hygroma. Scattered small volume subarachnoid hemorrhage present within the underlying right temporal occipital region (series 13, image 30). Smooth dural enhancement overlying the right cerebral convexity likely reactive. 9 mm acute ischemic left cerebellar infarct seen adjacent to the hematoma (series 5, image 58). No other evidence for acute ischemia. Underlying cerebral volume normal. Mild chronic microvascular ischemic changes noted within the periventricular white matter. No other areas of chronic infarction. No mass lesion or abnormal enhancement elsewhere within the brain. Vascular: Major intracranial vascular flow voids maintained left cerebellar AVM measures approximately 13 x 18 mm (series 12, image 7). Skull and upper cervical spine: Craniocervical junction within normal limits. Upper cervical spine normal. Bone marrow signal intensity normal. No scalp soft tissue abnormality. Sinuses/Orbits: Globes and orbital soft tissues within normal limits. Mild scattered mucosal thickening throughout the ethmoidal air cells. Paranasal sinuses are otherwise clear. Trace right mastoid effusion noted, of doubtful significance. Inner ear structures normal. Other: None. IMPRESSION: 1. No significant interval change in size and appearance of evolving intraparenchymal hemorrhage involving the right cerebellum and cerebellar vermis. Similar regional mass effect without hydrocephalus or herniation. 2. Associated small right posterior fossa and posterior right cerebral convexity subdural hematoma without significant mass effect, stable. Persistent small volume underlying subarachnoid hemorrhage. 3. 9 mm acute ischemic nonhemorrhagic left cerebellar infarct adjacent to the cerebellar hemorrhage. 4. Small left cerebellar AVM, presumably  the source of hemorrhage. Electronically Signed   By: Jeannine Boga M.D.   On: 02/17/2018 03:39   Dg Chest Port 1 View  Result Date: 02/17/2018 CLINICAL DATA:  Respiratory failure EXAM: PORTABLE CHEST 1 VIEW COMPARISON:  1 day prior FINDINGS: Left internal jugular line tip at mid SVC. Cardiomegaly accentuated by AP portable technique. Possible small left pleural effusion. No pneumothorax. Low lung volumes with resultant pulmonary interstitial prominence. Left lower lobe airspace disease is similar. Right perihilar airspace disease is slightly increased. IMPRESSION: Bilateral airspace disease and progressive, similar on the left on the right. Infection and/or alveolar pulmonary edema. Electronically Signed   By: Abigail Miyamoto M.D.   On: 02/17/2018 08:56   Dg Chest Port 1 View  Result Date: 02/16/2018 CLINICAL DATA:  Respiratory failure EXAM: PORTABLE CHEST 1 VIEW COMPARISON:  02/15/2018 FINDINGS: Left jugular central line is again identified and stable. Cardiac shadow is stable. Increasing left basilar infiltrate with likely small effusion is noted. No pneumothorax is seen. Mild right basilar atelectasis is seen. No bony abnormality is noted. IMPRESSION: Increasing left basilar infiltrate with small effusion. Increasing right basilar atelectasis. Electronically Signed   By: Inez Catalina M.D.   On: 02/16/2018 07:34   Dg Chest [facility] 1 View  Result  Date: 02/15/2018 CLINICAL DATA:  Central line placement. EXAM: PORTABLE CHEST 1 VIEW COMPARISON:  Tool 07/2017 FINDINGS: Left internal jugular approach central venous catheter terminates in the expected location of the proximal superior vena cava. The patient has been extubated. Enlarged cardiac silhouette.  Left lower lobe airspace consolidation No evidence of pneumothorax. Elevation of the left hemidiaphragm with airspace consolidation versus atelectasis in the left lower lobe. Left pleural effusion is not excluded. Osseous structures are without acute  abnormality. Soft tissues are grossly normal. IMPRESSION: Status post placement of left internal jugular approach central venous catheter with tip in the expected location of the proximal superior vena cava. No evidence of pneumothorax. Electronically Signed   By: Fidela Salisbury M.D.   On: 02/15/2018 17:06   Dg Chest Port 1 View  Result Date: 02/15/2018 CLINICAL DATA:  72 year old male status post intubation. EXAM: PORTABLE CHEST 1 VIEW COMPARISON:  Earlier radiograph dated 03/07/2018 FINDINGS: Endotracheal tube above the carina in similar position. Interval placement of an enteric tube which appears to extend below the diaphragm with tip in the left upper quadrant likely in the gastric fundus. There is shallow inspiration with bibasilar atelectasis. Mild eventration of the left hemidiaphragm. No focal consolidation, pleural effusion, or pneumothorax. Stable cardiac silhouette. No acute osseous pathology. IMPRESSION: Interval placement of an enteric tube with tip likely in the gastric fundus. No other interval change. Electronically Signed   By: Anner Crete M.D.   On: 02/15/2018 01:18   Dg Swallowing Func-speech Pathology  Result Date: 02/16/2018 Objective Swallowing Evaluation: Type of Study: MBS-Modified Barium Swallow Study  Patient Details Name: VILAS EDGERLY MRN: [id number] Date of Birth: 24-May-1945 Today's Date: 02/16/2018 Time: SLP Start Time (ACUTE ONLY): 1210 -SLP Stop Time (ACUTE ONLY): 1235 SLP Time Calculation (min) (ACUTE ONLY): 25 min Past Medical History: Past Medical History: Diagnosis Date . Anemia  . Hypertension  . Renal disorder  Past Surgical History: No past surgical history on file.  Subjective: Pt asleep, able to rouse Assessment / Plan / Recommendation CHL IP CLINICAL IMPRESSIONS 02/16/2018 Clinical Impression Pt demonstrates a mild oral dysphagia and moderate pharyngeal dysphagia characterized by right labial and lingual weakness though lingual propulsion and mastication of  liquids and soft soldis appear adequate with only mild residue and anterior spillage. Pharyngeal impairment includes intermittently decreased movement of the base of tongue and upper pharyngeal constrictors for bolus propulsion leading to vallecular residuals ranging from severe to mild. Larger bolus size does appear to trigger best propulsion. A head turn left was also significantly better at reducing vallecular residuals than any other posture. Pt senses residue and sometimes his ineffective efforts to clear appear like gagging. Recommend dys 2 (fine chip) and nectar thick liquids as thin liquids did result in silent aspiration before the swallow with larger boluses. Pts cough is not effective to clear. Will follow closely for tolerance.  SLP Visit Diagnosis Dysphagia, oropharyngeal phase (R13.12) Attention and concentration deficit following -- Frontal lobe and executive function deficit following -- Impact on safety and function --   CHL IP TREATMENT RECOMMENDATION 02/16/2018 Treatment Recommendations Therapy as outlined in treatment plan below   Prognosis 02/16/2018 Prognosis for Safe Diet Advancement Good Barriers to Reach Goals -- Barriers/Prognosis Comment -- CHL IP DIET RECOMMENDATION 02/16/2018 SLP Diet Recommendations Dysphagia 2 (Fine chop) solids;Nectar thick liquid Liquid Administration via Straw Medication Administration Crushed with puree Compensations Slow rate;Small sips/bites;Minimize environmental distractions Postural Changes (No Data)   CHL IP OTHER RECOMMENDATIONS 02/16/2018 Recommended Consults -- Oral Care  Recommendations Oral care BID Other Recommendations Have oral suction available   CHL IP FOLLOW UP RECOMMENDATIONS 02/16/2018 Follow up Recommendations Inpatient Rehab   CHL IP FREQUENCY AND DURATION 02/16/2018 Speech Therapy Frequency (ACUTE ONLY) min 2x/week Treatment Duration 2 weeks      CHL IP ORAL PHASE 02/16/2018 Oral Phase Impaired Oral - Pudding Teaspoon -- Oral - Pudding Cup -- Oral -  Honey Teaspoon -- Oral - Honey Cup -- Oral - Nectar Teaspoon -- Oral - Nectar Cup -- Oral - Nectar Straw Right anterior bolus loss;Weak lingual manipulation;Decreased bolus cohesion Oral - Thin Teaspoon -- Oral - Thin Cup -- Oral - Thin Straw Decreased bolus cohesion;Weak lingual manipulation;Right anterior bolus loss Oral - Puree Decreased bolus cohesion;Reduced posterior propulsion;Weak lingual manipulation;Delayed oral transit Oral - Mech Soft Decreased bolus cohesion;Reduced posterior propulsion;Weak lingual manipulation;Delayed oral transit Oral - Regular -- Oral - Multi-Consistency -- Oral - Pill -- Oral Phase - Comment --  CHL IP PHARYNGEAL PHASE 02/16/2018 Pharyngeal Phase Impaired Pharyngeal- Pudding Teaspoon -- Pharyngeal -- Pharyngeal- Pudding Cup -- Pharyngeal -- Pharyngeal- Honey Teaspoon -- Pharyngeal -- Pharyngeal- Honey Cup NT Pharyngeal -- Pharyngeal- Nectar Teaspoon -- Pharyngeal -- Pharyngeal- Nectar Cup -- Pharyngeal -- Pharyngeal- Nectar Straw Delayed swallow initiation-pyriform sinuses;Delayed swallow initiation-vallecula;Reduced pharyngeal peristalsis;Reduced tongue base retraction;Penetration/Apiration after swallow;Trace aspiration;Pharyngeal residue - valleculae Pharyngeal Material enters airway, CONTACTS cords and not ejected out;Material does not enter airway Pharyngeal- Thin Teaspoon -- Pharyngeal -- Pharyngeal- Thin Cup -- Pharyngeal -- Pharyngeal- Thin Straw Delayed swallow initiation-pyriform sinuses;Delayed swallow initiation-vallecula;Reduced pharyngeal peristalsis;Reduced tongue base retraction;Trace aspiration;Pharyngeal residue - valleculae;Penetration/Aspiration before swallow Pharyngeal Material enters airway, passes BELOW cords without attempt by patient to eject out (silent aspiration) Pharyngeal- Puree Delayed swallow initiation-pyriform sinuses;Delayed swallow initiation-vallecula;Reduced pharyngeal peristalsis;Reduced tongue base retraction;Pharyngeal residue - valleculae  Pharyngeal Material does not enter airway Pharyngeal- Mechanical Soft Delayed swallow initiation-pyriform sinuses;Delayed swallow initiation-vallecula;Reduced pharyngeal peristalsis;Reduced tongue base retraction;Pharyngeal residue - valleculae Pharyngeal -- Pharyngeal- Regular -- Pharyngeal -- Pharyngeal- Multi-consistency -- Pharyngeal -- Pharyngeal- Pill -- Pharyngeal -- Pharyngeal Comment --  No flowsheet data found. Herbie Baltimore, MA CCC-SLP Acute Rehabilitation Services Pager [phone] Office [phone] Lynann Beaver 02/16/2018, 1:53 PM               TTE  Normal ejection fraction of 55-60% with no regional wall motion abnormalities. Cannot rule out echodense mass on septal leaflet of tricuspid valve   PHYSICAL EXAM  Temp:  [98.2 F (36.8 C)-99.2 F (37.3 C)] 98.9 F (37.2 C) (12/07 1140) Pulse Rate:  [80-101] 82 (12/07 1400) Resp:  [15-35] 23 (12/07 1400) BP: (96-161)/(51-107) 131/75 (12/07 1400) SpO2:  [93 %-100 %] 100 % (12/07 1400)  General - Well nourished, well developed, in slight respiratory distress with secretions.  Ophthalmologic - fundi not visualized due to noncooperation.  Cardiovascular - Regular rate and rhythm.  Neuro - mildly lethargic, initially sleepy but easily arousable and eyes open, able to follow all simple commands.  PERRL, b/l abduction not complete with improved nystagums b/l gaze and bidirectional.  Visual field full.  Right facial droop, tongue midline.  Facial sensation symmetrical.  Severe dysarthria. Moving all extremities symmetrically, sensation symmetrical.  Right arm finger-to-nose mild ataxia.  Heel-to-shin not corporative.  Gait not tested.   ASSESSMENT/PLAN Mr. Marvin Mendez is a 72 y.o. male with history of hypertension, postural hypotension on Florinef, CKD, thrombocytopenia admitted for generalized weakness and lethargy started 5:30 PM 03/12/2018. No tPA given due to [place].    ICH:  right cerebellum and  cerebellar vermis ICH  with right posterior fossa SDH, likely due to AVM shown on CTA - discussed with NSG Dr. Ellene Route and he will follow up to repair AVM once pt stable  Resultant lethargy, nystagmus, right arm ataxia, respiratory distress  CT head cerebellar ICH with SDH  Repeat CT showed decreased right cerebellum and vermis ICH, stable right posterior fossa SDH  MRI with and without contrast pending  CTA head and neck concerning left cerebellar AVM - Dr. Ellene Route will take care of it once pt stable  2D Echo  pending  LDL 76  HgbA1c 5.5  SCDs for VTE prophylaxis  NPO  No antithrombotic prior to admission, now on No antithrombotic.   Ongoing aggressive stroke risk factor management  Therapy recommendations:  Pending   Disposition:  Pending   Respiratory distress  Intubated on admission  Extubated 02/15/18  Pt still has mild respiratory distress with difficulty handling secretions  On breathing treatment  Chest PT  CCM on board  Cerebral edema  Posterior fossa mass-effect  No hydrocephalus on CT so far  CT head and neck showed stable hematoma and no hydro  MRI brain 02/17/18 shows stable appearance of the cerebellar hemorrhage and mass effect. As well as small right posterior fossa and right cerebral convexity subdurals. 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 Small left cerebellar AVM  On 3% saline @ 75  Sodium 137->146->148  Sodium goal 150-155  Sodium check every 6 hours  Has central line placement  Hypertension . Stable  BP goal < 140  On low dose cleviprex   Consider po BP meds once pass swallow  Hyperlipidemia  Home meds:  none   LDL 76, goal < 70  No statin at this time  Dysphagia  Did not pass swallow  MBS today  If not passing swallow, may consider cortrak today  CKD  Creatinine 1.5->1.5->1.34  On IV fluid  BMP monitoring  Other Stroke Risk Factors  Advanced age  Other Active Problems  Hyperglycemia,  improved  Postural hypotension on Florinef at home  Thrombocytopenia platelet 147-> 138  Hospital day # 2  I had a long discussion with the patient's grandson at the bedside and explained evaluation and treatment plan. Recommend elective diagnostic cerebral catheter angiogram followed by endovascular treatment after he recovers in the rehabilitation phase. Discussed with Dr. Loanne Drilling and Dr. Cram.Recommend change systolic blood pressure goal below 160 and wean Cardene drip. Use when necessary labetalol and hydralazine. Check CT scan of the head without contrast tomorrow morning. Hold hypertonic saline drip if repeat sodium is greater than 156 This patient is critically ill due to cerebellar ICH, posterior fossa SDH, cerebral edema, respiratory distress hypertensive emergency and at significant risk of neurological worsening, death form hematoma expansion, cerebral edema, brain herniation, seizure, respiratory failure. This patient's care requires constant monitoring of vital signs, hemodynamics, respiratory and cardiac monitoring, review of multiple databases, neurological assessment, discussion with family, other specialists and medical decision making of high complexity. I spent 40 minutes of neurocritical care time in the care of this patient. I had long discussion with niece and nephrew in law at bedside, updated pt current condition, treatment plan and potential prognosis. They expressed understanding and appreciation.   Antony Contras, MD Stroke Neurology 02/17/2018 2:32 PM    To contact Stroke Continuity provider, please refer to [email or web address] After hours, contact General Neurology

## 2018-02-17 NOTE — Progress Notes (Signed)
Rehab Admissions Coordinator Note:  Per PT recommendation, patient was screened by Jhonnie Garner for appropriateness for an Inpatient Acute Rehab Consult.  At this time, we are recommending Inpatient Rehab consult. AC will contact MD to request an IP Rehab Consult Order.    Jhonnie Garner 02/17/2018, 9:38 AM  I can be reached at [phone].

## 2018-02-17 NOTE — Progress Notes (Signed)
Patient combative and agitated. MD paged. RN will continue to monitor.

## 2018-02-18 ENCOUNTER — Inpatient Hospital Stay (HOSPITAL_COMMUNITY): Payer: PPO

## 2018-02-18 LAB — SODIUM
Sodium: 159 mmol/L — ABNORMAL HIGH (ref 135–145)
Sodium: 159 mmol/L — ABNORMAL HIGH (ref 135–145)

## 2018-02-18 LAB — POCT I-STAT 3, ART BLOOD GAS (G3+)
Acid-base deficit: 5 mmol/L — ABNORMAL HIGH (ref 0.0–2.0)
Bicarbonate: 19.6 mmol/L — ABNORMAL LOW (ref 20.0–28.0)
O2 Saturation: 93 %
PCO2 ART: 34.9 mmHg (ref 32.0–48.0)
Patient temperature: 99.6
TCO2: 21 mmol/L — ABNORMAL LOW (ref 22–32)
pH, Arterial: 7.361 (ref 7.350–7.450)
pO2, Arterial: 73 mmHg — ABNORMAL LOW (ref 83.0–108.0)

## 2018-02-18 LAB — GLUCOSE, CAPILLARY
Glucose-Capillary: 100 mg/dL — ABNORMAL HIGH (ref 70–99)
Glucose-Capillary: 102 mg/dL — ABNORMAL HIGH (ref 70–99)
Glucose-Capillary: 103 mg/dL — ABNORMAL HIGH (ref 70–99)
Glucose-Capillary: 112 mg/dL — ABNORMAL HIGH (ref 70–99)
Glucose-Capillary: 120 mg/dL — ABNORMAL HIGH (ref 70–99)
Glucose-Capillary: 97 mg/dL (ref 70–99)

## 2018-02-18 MED ORDER — ORAL CARE MOUTH RINSE
15.0000 mL | Freq: Two times a day (BID) | OROMUCOSAL | Status: DC
  Administered 2018-02-19 (×2): 15 mL via OROMUCOSAL

## 2018-02-18 MED ORDER — DEXTROSE 5 % IV BOLUS: 500.0000 mL | Freq: Once | INTRAVENOUS | Status: DC

## 2018-02-18 MED ORDER — IPRATROPIUM-ALBUTEROL 0.5-2.5 (3) MG/3ML IN SOLN
3.0000 mL | Freq: Four times a day (QID) | RESPIRATORY_TRACT | Status: DC | PRN
  Administered 2018-02-18 (×3): 3 mL via RESPIRATORY_TRACT
  Filled 2018-02-18 (×3): qty 3

## 2018-02-18 MED ORDER — CHLORHEXIDINE GLUCONATE 0.12 % MT SOLN
15.0000 mL | Freq: Two times a day (BID) | OROMUCOSAL | Status: DC
Start: 2018-02-18 — End: 2018-02-25
  Administered 2018-02-18 – 2018-02-24 (×13): 15 mL via OROMUCOSAL
  Filled 2018-02-18 (×7): qty 15

## 2018-02-18 MED ORDER — QUETIAPINE FUMARATE 25 MG PO TABS
25.0000 mg | ORAL_TABLET | Freq: Every day | ORAL | Status: DC
  Administered 2018-02-19 – 2018-02-24 (×6): 25 mg via ORAL
  Filled 2018-02-18 (×6): qty 1

## 2018-02-18 MED ORDER — HALOPERIDOL LACTATE 5 MG/ML IJ SOLN: 5.0000 mg | Freq: Once | INTRAMUSCULAR | Status: DC

## 2018-02-18 MED ORDER — LORAZEPAM 2 MG/ML IJ SOLN
1.0000 mg | Freq: Once | INTRAMUSCULAR | Status: AC
  Administered 2018-02-18: 1 mg via INTRAVENOUS
  Filled 2018-02-18: qty 1

## 2018-02-18 MED ORDER — LABETALOL HCL 5 MG/ML IV SOLN
20.0000 mg | INTRAVENOUS | Status: DC | PRN
  Administered 2018-02-18 – 2018-02-19 (×3): 20 mg via INTRAVENOUS
  Filled 2018-02-18 (×3): qty 4

## 2018-02-18 NOTE — Progress Notes (Signed)
STROKE TEAM PROGRESS NOTE   SUBJECTIVE (INTERVAL HISTORY) His RN is at the bedside.    He was agitated last night and requiring sedation with Haldol followed by Ativan and is in restraints. He has slightly elevated white count and his coughing and wheezing mostly due to upper airway protection issues.He is now off  Cardene drip. His serum sodium was 1:15 9 and hypertonic saline has been on hold. Follow-up CT scan shows improvement in the hematoma size and mass effect OBJECTIVE Temp:  [98.4 F (36.9 C)-99.6 F (37.6 C)] 99.6 F (37.6 C) (12/08 0800) Pulse Rate:  [32-97] 93 (12/08 1015) Cardiac Rhythm: Normal sinus rhythm (12/08 0800) Resp:  [16-35] 23 (12/08 1015) BP: (103-188)/(60-116) 148/108 (12/08 1015) SpO2:  [90 %-100 %] 98 % (12/08 1015) Weight:  [95.4 kg] 95.4 kg (12/08 0630)  Recent Labs  Lab 02/17/18 1602 02/17/18 2022 02/18/18 0009 02/18/18 0818 02/18/18 1202  GLUCAP 135* 110* 120* 97 100*   Recent Labs  Lab 02/17/2018 2349 03/07/2018 2355 02/15/18 0326  02/16/18 0408  02/17/18 0523 02/17/18 0925 02/17/18 1415 02/17/18 2024 02/18/18 0250 02/18/18 0800  NA 139 139 137   < > 148*   < > 156* 156* 157* 159* 159* 159*  K 4.1 3.8 4.5  --  3.9  --  4.2  --   --   --   --   --   CL 110 112* 108  --  121*  --  128*  --   --   --   --   --   CO2 16*  --  16*  --  20*  --  20*  --   --   --   --   --   GLUCOSE 190* 194* 182*  --  121*  --  131*  --   --   --   --   --   BUN 20 22 21   --  14  --  14  --   --   --   --   --   CREATININE 1.55* 1.50* 1.50*  --  1.34*  --  1.62*  --   --   --   --   --   CALCIUM 8.8*  --  8.8*  --  8.8*  --  8.8*  --   --   --   --   --   MG  --   --  2.0  --   --   --  2.0  --   --   --   --   --   PHOS  --   --  2.6  --  1.5*  --  2.1*  --   --   --   --   --    < > = values in this interval not displayed.   No results for input(s): AST, ALT, ALKPHOS, BILITOT, PROT, ALBUMIN in the last 168 hours. Recent Labs  Lab 03/13/2018 2349  02/13/2018 2355 02/15/18 0326 02/17/18 0523  WBC 9.5  --  8.4 12.0*  NEUTROABS 7.3  --   --   --   HGB 12.4* 13.9 12.1* 11.4*  HCT 40.3 41.0 36.9* 36.0*  MCV 96.9  --  94.9 97.8  PLT 147*  --  138* 95*   No results for input(s): CKTOTAL, CKMB, CKMBINDEX, TROPONINI in the last 168 hours. No results for input(s): LABPROT, INR in the last 72 hours. No results for input(s): COLORURINE, [place], [place],  GLUCOSEU, HGBUR, BILIRUBINUR, KETONESUR, PROTEINUR, UROBILINOGEN, NITRITE, LEUKOCYTESUR in the last 72 hours.  Invalid input(s): APPERANCEUR     Component Value Date/Time   CHOL 134 02/16/2018 0408   TRIG 74 02/16/2018 0408   HDL 43 02/16/2018 0408   CHOLHDL 3.1 02/16/2018 0408   VLDL 15 02/16/2018 0408   LDLCALC 76 02/16/2018 0408   Lab Results  Component Value Date   HGBA1C 5.5 02/28/2018   No results found for: LABOPIA, COCAINSCRNUR, LABBENZ, AMPHETMU, THCU, LABBARB  No results for input(s): ETH in the last 168 hours.  I have personally reviewed the radiological images below and agree with the radiology interpretations.  Ct Angio Head W Or Wo Contrast  Result Date: 02/15/2018 CLINICAL DATA:  72 y/o  M; follow-up of intracranial hemorrhage. EXAM: CT ANGIOGRAPHY HEAD AND NECK TECHNIQUE: Multidetector CT imaging of the head and neck was performed using the standard protocol during bolus administration of intravenous contrast. Multiplanar CT image reconstructions and MIPs were obtained to evaluate the vascular anatomy. Carotid stenosis measurements (when applicable) are obtained utilizing NASCET criteria, using the distal internal carotid diameter as the denominator. CONTRAST:  27mL ISOVUE-370 IOPAMIDOL (ISOVUE-370) INJECTION 76% COMPARISON:  03/13/2018 CT head. FINDINGS: CTA NECK FINDINGS Aortic arch: Standard branching. Imaged portion shows no evidence of aneurysm or dissection. No significant stenosis of the major arch vessel origins. Right carotid system: No evidence of dissection,  stenosis (50% or greater) or occlusion. Left carotid system: No evidence of dissection, stenosis (50% or greater) or occlusion. Vertebral arteries: Codominant. No evidence of dissection, stenosis (50% or greater) or occlusion. Skeleton: Mild-to-moderate cervical spondylosis with multilevel disc and facet degenerative changes. No high-grade bony canal stenosis. Other neck: Negative. Upper chest: Negative. Review of the MIP images confirms the above findings CTA HEAD FINDINGS Anterior circulation: No significant stenosis, proximal occlusion, aneurysm, or vascular malformation. Posterior circulation: Mild stenosis of the left vertebral artery at the left PICA origin. The left PICA asymmetrically enlarged with a branch extending to a cluster of vessels within the posteromedial aspect of the left cerebellar hemisphere spanning approximately 11 mm (series 12, image [id number] and series 13, image 105, also see sagittal MIPS). The right vertebral, basilar, and bilateral posterior cerebral arteries are unremarkable. Venous sinuses: Hyperdense prominent venous structure within the medial cerebellum, possibly a draining vein from the left cerebellar vascular malformation (series 12, image 22). Anatomic variants: None significant. Delayed phase: No abnormal intracranial enhancement. Review of the MIP images confirms the above findings IMPRESSION: CTA head: 1. Small group of abnormal vessels within the posteromedial aspect of left cerebellar hemisphere spanning approximately 11 mm, suspected arteriovenous malformation. 2. Mild stenosis of left vertebral artery and PICA origin. 3. No additional aneurysm, stenosis, vascular malformation, or occlusion of the anterior and posterior circulation. CTA neck: Patent carotid and vertebral arteries of the neck. No dissection, aneurysm, or hemodynamically significant stenosis by NASCET criteria. These results will be called to the ordering clinician or representative by the Radiologist  Assistant, and communication documented in the PACS or zVision Dashboard. Electronically Signed   By: Kristine Garbe M.D.   On: 02/15/2018 19:17   Ct Head Wo Contrast  Result Date: 02/18/2018 CLINICAL DATA:  Follow-up examination for intracranial hemorrhage. EXAM: CT HEAD WITHOUT CONTRAST TECHNIQUE: Contiguous axial images were obtained from the base of the skull through the vertex without intravenous contrast. COMPARISON:  Prior CT from 02/25/2018. FINDINGS: Brain: Involving central cerebellar hemorrhage decreased in size now measuring 2.5 x 2.7 x 2.5 cm (8.4 cc), previously  2.8 x 3.3 x 3.3 cm (16 cc). Subdural extension with small right parafalcine and tentorial hematoma again noted, similar. Subdural hematoma at the right posterior fossa little interval changed measuring up to 4-5 mm. Persistent localized mass effect with effacement of the suprasellar cistern. Mass effect on the adjacent fourth ventricle which remains partially effaced, relatively similar. No hydrocephalus. Trace subarachnoid hemorrhage within the right temporal occipital region persist. No other new acute intracranial abnormality. No acute large vessel territory infarct. Vascular: No hyperdense vessel. Skull: Scalp soft tissues and calvarium demonstrate no acute finding. Sinuses/Orbits: Globes and orbital soft tissues within normal limits. Scattered mucosal thickening throughout the ethmoidal air cells. Paranasal sinuses are otherwise grossly clear. No mastoid effusion. Other: None. IMPRESSION: 1. Interval decrease in size of evolving acute central cerebellar hematoma (8 cc, previously 16 cc). Persistent regional mass effect without obstructive hydrocephalus. 2. Persistent small subdural hematoma overlying the right posterior fossa and along the falx and tentorium without mass effect, similar to previous. Persistent trace subarachnoid hemorrhage. Electronically Signed   By: Jeannine Boga M.D.   On: 02/18/2018 03:44   Ct  Head Wo Contrast  Result Date: 02/15/2018 CLINICAL DATA:  Altered mental status. Follow-up intracranial hemorrhage. EXAM: CT HEAD WITHOUT CONTRAST TECHNIQUE: Contiguous axial images were obtained from the base of the skull through the vertex without intravenous contrast. COMPARISON:  CT HEAD February 14, 2018 at 1815 hours FINDINGS: BRAIN: Central cerebellar 2.8 x 3.3 x 3.3 cm (volume = 16 cc) intraparenchymal hematoma was 30 cc. Subdural extension with decreased 2-3 mm falcotentorial subdural hematoma. RIGHT posterior fossa subdural hematoma measuring to 4 mm tracking into the included cervical spine. Effaced supra cerebellar cistern. Regional mass effect resulting in narrowed fourth ventricle without hydrocephalus. Trace subarachnoid hemorrhage. No acute large vascular territory infarct. VASCULAR: Trace calcific atherosclerosis of the carotid siphons. SKULL: No skull fracture. No significant scalp soft tissue swelling. SINUSES/ORBITS: Mild paranasal sinus mucosal thickening. Mastoid air cells are well aerated.The included ocular globes and orbital contents are non-suspicious. OTHER: None. IMPRESSION: 1. Evolving acute central cerebellar hematoma (16 cc, previously 30 cc). Regional mass effect without obstructive hydrocephalus. 2. Decreased falcotentorial and RIGHT posterior fossa subdural hematomas extending into included cervical spine. Small volume subarachnoid hemorrhage. Electronically Signed   By: Elon Alas M.D.   On: 02/15/2018 01:16   Ct Angio Neck W Or Wo Contrast  Result Date: 02/15/2018 CLINICAL DATA:  72 y/o  M; follow-up of intracranial hemorrhage. EXAM: CT ANGIOGRAPHY HEAD AND NECK TECHNIQUE: Multidetector CT imaging of the head and neck was performed using the standard protocol during bolus administration of intravenous contrast. Multiplanar CT image reconstructions and MIPs were obtained to evaluate the vascular anatomy. Carotid stenosis measurements (when applicable) are obtained  utilizing NASCET criteria, using the distal internal carotid diameter as the denominator. CONTRAST:  35mL ISOVUE-370 IOPAMIDOL (ISOVUE-370) INJECTION 76% COMPARISON:  02/21/2018 CT head. FINDINGS: CTA NECK FINDINGS Aortic arch: Standard branching. Imaged portion shows no evidence of aneurysm or dissection. No significant stenosis of the major arch vessel origins. Right carotid system: No evidence of dissection, stenosis (50% or greater) or occlusion. Left carotid system: No evidence of dissection, stenosis (50% or greater) or occlusion. Vertebral arteries: Codominant. No evidence of dissection, stenosis (50% or greater) or occlusion. Skeleton: Mild-to-moderate cervical spondylosis with multilevel disc and facet degenerative changes. No high-grade bony canal stenosis. Other neck: Negative. Upper chest: Negative. Review of the MIP images confirms the above findings CTA HEAD FINDINGS Anterior circulation: No significant stenosis, proximal occlusion,  aneurysm, or vascular malformation. Posterior circulation: Mild stenosis of the left vertebral artery at the left PICA origin. The left PICA asymmetrically enlarged with a branch extending to a cluster of vessels within the posteromedial aspect of the left cerebellar hemisphere spanning approximately 11 mm (series 12, image [id number] and series 13, image 105, also see sagittal MIPS). The right vertebral, basilar, and bilateral posterior cerebral arteries are unremarkable. Venous sinuses: Hyperdense prominent venous structure within the medial cerebellum, possibly a draining vein from the left cerebellar vascular malformation (series 12, image 22). Anatomic variants: None significant. Delayed phase: No abnormal intracranial enhancement. Review of the MIP images confirms the above findings IMPRESSION: CTA head: 1. Small group of abnormal vessels within the posteromedial aspect of left cerebellar hemisphere spanning approximately 11 mm, suspected arteriovenous malformation. 2.  Mild stenosis of left vertebral artery and PICA origin. 3. No additional aneurysm, stenosis, vascular malformation, or occlusion of the anterior and posterior circulation. CTA neck: Patent carotid and vertebral arteries of the neck. No dissection, aneurysm, or hemodynamically significant stenosis by NASCET criteria. These results will be called to the ordering clinician or representative by the Radiologist Assistant, and communication documented in the PACS or zVision Dashboard. Electronically Signed   By: Kristine Garbe M.D.   On: 02/15/2018 19:17   Mr Jeri Cos YI Contrast  Result Date: 02/17/2018 CLINICAL DATA:  Follow-up exam for intracranial hemorrhage. EXAM: MRI HEAD WITHOUT AND WITH CONTRAST TECHNIQUE: Multiplanar, multiecho pulse sequences of the brain and surrounding structures were obtained without and with intravenous contrast. CONTRAST:  90 cc of Gadavist. COMPARISON:  Comparison made with prior CTA from 02/15/2018 as well as previous CTs from 03/09/2018. FINDINGS: Brain: Intraparenchymal hemorrhage involving the cerebellar vermis and right cerebellar hemisphere again seen, relatively stable in size measuring approximately 3.6 x 2.9 x 3.2 cm. Localized edema with regional mass effect similar as well. Partial effacement of the fourth ventricle and fourth ventricular outflow tract anteriorly which remain patent. Ventricular size is stable without hydrocephalus or ventricular trapping. Slight asymmetry of the lateral ventricles with the left larger than the right noted, likely congenital. Mild mass effect on the brainstem anteriorly without frank compression. No transtentorial herniation. Probable enhancing tangle of vessels at the adjacent posterior left cerebellum suspicious for AVM (series 18, image 16, also seen on prior CTA. No other abnormal enhancement seen underlying the hematoma. Right posterior fossa of subdural hematoma relatively stable measuring up to 4 mm in thickness. Additional  small subdural collection overlying the posterior right cerebral convexity measures up to 2 mm without mass effect, likely small volume subdural hemorrhage and/or reactive hygroma. Scattered small volume subarachnoid hemorrhage present within the underlying right temporal occipital region (series 13, image 30). Smooth dural enhancement overlying the right cerebral convexity likely reactive. 9 mm acute ischemic left cerebellar infarct seen adjacent to the hematoma (series 5, image 58). No other evidence for acute ischemia. Underlying cerebral volume normal. Mild chronic microvascular ischemic changes noted within the periventricular white matter. No other areas of chronic infarction. No mass lesion or abnormal enhancement elsewhere within the brain. Vascular: Major intracranial vascular flow voids maintained left cerebellar AVM measures approximately 13 x 18 mm (series 12, image 7). Skull and upper cervical spine: Craniocervical junction within normal limits. Upper cervical spine normal. Bone marrow signal intensity normal. No scalp soft tissue abnormality. Sinuses/Orbits: Globes and orbital soft tissues within normal limits. Mild scattered mucosal thickening throughout the ethmoidal air cells. Paranasal sinuses are otherwise clear. Trace right mastoid effusion noted, of  doubtful significance. Inner ear structures normal. Other: None. IMPRESSION: 1. No significant interval change in size and appearance of evolving intraparenchymal hemorrhage involving the right cerebellum and cerebellar vermis. Similar regional mass effect without hydrocephalus or herniation. 2. Associated small right posterior fossa and posterior right cerebral convexity subdural hematoma without significant mass effect, stable. Persistent small volume underlying subarachnoid hemorrhage. 3. 9 mm acute ischemic nonhemorrhagic left cerebellar infarct adjacent to the cerebellar hemorrhage. 4. Small left cerebellar AVM, presumably the source of  hemorrhage. Electronically Signed   By: Jeannine Boga M.D.   On: 02/17/2018 03:39   Dg Chest Port 1 View  Result Date: 02/17/2018 CLINICAL DATA:  Respiratory failure EXAM: PORTABLE CHEST 1 VIEW COMPARISON:  1 day prior FINDINGS: Left internal jugular line tip at mid SVC. Cardiomegaly accentuated by AP portable technique. Possible small left pleural effusion. No pneumothorax. Low lung volumes with resultant pulmonary interstitial prominence. Left lower lobe airspace disease is similar. Right perihilar airspace disease is slightly increased. IMPRESSION: Bilateral airspace disease and progressive, similar on the left on the right. Infection and/or alveolar pulmonary edema. Electronically Signed   By: Abigail Miyamoto M.D.   On: 02/17/2018 08:56   Dg Chest Port 1 View  Result Date: 02/16/2018 CLINICAL DATA:  Respiratory failure EXAM: PORTABLE CHEST 1 VIEW COMPARISON:  02/15/2018 FINDINGS: Left jugular central line is again identified and stable. Cardiac shadow is stable. Increasing left basilar infiltrate with likely small effusion is noted. No pneumothorax is seen. Mild right basilar atelectasis is seen. No bony abnormality is noted. IMPRESSION: Increasing left basilar infiltrate with small effusion. Increasing right basilar atelectasis. Electronically Signed   By: Inez Catalina M.D.   On: 02/16/2018 07:34   Dg Chest Port 1 View  Result Date: 02/15/2018 CLINICAL DATA:  Central line placement. EXAM: PORTABLE CHEST 1 VIEW COMPARISON:  Tool 07/2017 FINDINGS: Left internal jugular approach central venous catheter terminates in the expected location of the proximal superior vena cava. The patient has been extubated. Enlarged cardiac silhouette.  Left lower lobe airspace consolidation No evidence of pneumothorax. Elevation of the left hemidiaphragm with airspace consolidation versus atelectasis in the left lower lobe. Left pleural effusion is not excluded. Osseous structures are without acute abnormality. Soft  tissues are grossly normal. IMPRESSION: Status post placement of left internal jugular approach central venous catheter with tip in the expected location of the proximal superior vena cava. No evidence of pneumothorax. Electronically Signed   By: Fidela Salisbury M.D.   On: 02/15/2018 17:06   Dg Chest Port 1 View  Result Date: 02/15/2018 CLINICAL DATA:  72 year old male status post intubation. EXAM: PORTABLE CHEST 1 VIEW COMPARISON:  Earlier radiograph dated 03/05/2018 FINDINGS: Endotracheal tube above the carina in similar position. Interval placement of an enteric tube which appears to extend below the diaphragm with tip in the left upper quadrant likely in the gastric fundus. There is shallow inspiration with bibasilar atelectasis. Mild eventration of the left hemidiaphragm. No focal consolidation, pleural effusion, or pneumothorax. Stable cardiac silhouette. No acute osseous pathology. IMPRESSION: Interval placement of an enteric tube with tip likely in the gastric fundus. No other interval change. Electronically Signed   By: Anner Crete M.D.   On: 02/15/2018 01:18   Dg Swallowing Func-speech Pathology  Result Date: 02/16/2018 Objective Swallowing Evaluation: Type of Study: MBS-Modified Barium Swallow Study  Patient Details Name: KAYIN OSMENT MRN: [id number] Date of Birth: 11-07-1945 Today's Date: 02/16/2018 Time: SLP Start Time (ACUTE ONLY): 2951 -SLP Stop Time (ACUTE ONLY):  1235 SLP Time Calculation (min) (ACUTE ONLY): 25 min Past Medical History: Past Medical History: Diagnosis Date . Anemia  . Hypertension  . Renal disorder  Past Surgical History: No past surgical history on file.  Subjective: Pt asleep, able to rouse Assessment / Plan / Recommendation CHL IP CLINICAL IMPRESSIONS 02/16/2018 Clinical Impression Pt demonstrates a mild oral dysphagia and moderate pharyngeal dysphagia characterized by right labial and lingual weakness though lingual propulsion and mastication of liquids and soft  soldis appear adequate with only mild residue and anterior spillage. Pharyngeal impairment includes intermittently decreased movement of the base of tongue and upper pharyngeal constrictors for bolus propulsion leading to vallecular residuals ranging from severe to mild. Larger bolus size does appear to trigger best propulsion. A head turn left was also significantly better at reducing vallecular residuals than any other posture. Pt senses residue and sometimes his ineffective efforts to clear appear like gagging. Recommend dys 2 (fine chip) and nectar thick liquids as thin liquids did result in silent aspiration before the swallow with larger boluses. Pts cough is not effective to clear. Will follow closely for tolerance.  SLP Visit Diagnosis Dysphagia, oropharyngeal phase (R13.12) Attention and concentration deficit following -- Frontal lobe and executive function deficit following -- Impact on safety and function --   CHL IP TREATMENT RECOMMENDATION 02/16/2018 Treatment Recommendations Therapy as outlined in treatment plan below   Prognosis 02/16/2018 Prognosis for Safe Diet Advancement Good Barriers to Reach Goals -- Barriers/Prognosis Comment -- CHL IP DIET RECOMMENDATION 02/16/2018 SLP Diet Recommendations Dysphagia 2 (Fine chop) solids;Nectar thick liquid Liquid Administration via Straw Medication Administration Crushed with puree Compensations Slow rate;Small sips/bites;Minimize environmental distractions Postural Changes (No Data)   CHL IP OTHER RECOMMENDATIONS 02/16/2018 Recommended Consults -- Oral Care Recommendations Oral care BID Other Recommendations Have oral suction available   CHL IP FOLLOW UP RECOMMENDATIONS 02/16/2018 Follow up Recommendations Inpatient Rehab   CHL IP FREQUENCY AND DURATION 02/16/2018 Speech Therapy Frequency (ACUTE ONLY) min 2x/week Treatment Duration 2 weeks      CHL IP ORAL PHASE 02/16/2018 Oral Phase Impaired Oral - Pudding Teaspoon -- Oral - Pudding Cup -- Oral - Honey Teaspoon --  Oral - Honey Cup -- Oral - Nectar Teaspoon -- Oral - Nectar Cup -- Oral - Nectar Straw Right anterior bolus loss;Weak lingual manipulation;Decreased bolus cohesion Oral - Thin Teaspoon -- Oral - Thin Cup -- Oral - Thin Straw Decreased bolus cohesion;Weak lingual manipulation;Right anterior bolus loss Oral - Puree Decreased bolus cohesion;Reduced posterior propulsion;Weak lingual manipulation;Delayed oral transit Oral - Mech Soft Decreased bolus cohesion;Reduced posterior propulsion;Weak lingual manipulation;Delayed oral transit Oral - Regular -- Oral - Multi-Consistency -- Oral - Pill -- Oral Phase - Comment --  CHL IP PHARYNGEAL PHASE 02/16/2018 Pharyngeal Phase Impaired Pharyngeal- Pudding Teaspoon -- Pharyngeal -- Pharyngeal- Pudding Cup -- Pharyngeal -- Pharyngeal- Honey Teaspoon -- Pharyngeal -- Pharyngeal- Honey Cup NT Pharyngeal -- Pharyngeal- Nectar Teaspoon -- Pharyngeal -- Pharyngeal- Nectar Cup -- Pharyngeal -- Pharyngeal- Nectar Straw Delayed swallow initiation-pyriform sinuses;Delayed swallow initiation-vallecula;Reduced pharyngeal peristalsis;Reduced tongue base retraction;Penetration/Apiration after swallow;Trace aspiration;Pharyngeal residue - valleculae Pharyngeal Material enters airway, CONTACTS cords and not ejected out;Material does not enter airway Pharyngeal- Thin Teaspoon -- Pharyngeal -- Pharyngeal- Thin Cup -- Pharyngeal -- Pharyngeal- Thin Straw Delayed swallow initiation-pyriform sinuses;Delayed swallow initiation-vallecula;Reduced pharyngeal peristalsis;Reduced tongue base retraction;Trace aspiration;Pharyngeal residue - valleculae;Penetration/Aspiration before swallow Pharyngeal Material enters airway, passes BELOW cords without attempt by patient to eject out (silent aspiration) Pharyngeal- Puree Delayed swallow initiation-pyriform sinuses;Delayed swallow initiation-vallecula;Reduced pharyngeal peristalsis;Reduced tongue  base retraction;Pharyngeal residue - valleculae Pharyngeal Material  does not enter airway Pharyngeal- Mechanical Soft Delayed swallow initiation-pyriform sinuses;Delayed swallow initiation-vallecula;Reduced pharyngeal peristalsis;Reduced tongue base retraction;Pharyngeal residue - valleculae Pharyngeal -- Pharyngeal- Regular -- Pharyngeal -- Pharyngeal- Multi-consistency -- Pharyngeal -- Pharyngeal- Pill -- Pharyngeal -- Pharyngeal Comment --  No flowsheet data found. Marvin [place], MA CCC-SLP Acute Rehabilitation Services Pager [phone] Office [phone] Lynann Beaver 02/16/2018, 1:53 PM               TTE  Normal ejection fraction of 55-60% with no regional wall motion abnormalities. Cannot rule out echodense mass on septal leaflet of tricuspid valve   PHYSICAL EXAM  Temp:  [98.4 F (36.9 C)-99.6 F (37.6 C)] 99.6 F (37.6 C) (12/08 0800) Pulse Rate:  [32-97] 93 (12/08 1015) Resp:  [16-35] 23 (12/08 1015) BP: (103-188)/(60-116) 148/108 (12/08 1015) SpO2:  [90 %-100 %] 98 % (12/08 1015) Weight:  [95.4 kg] 95.4 kg (12/08 0630)  General - Well nourished, well developed, in slight respiratory distress with secretions .and wheezing  Ophthalmologic - fundi not visualized due to noncooperation.  Cardiovascular - Regular rate and rhythm.  Neuro - obtunded and can barely be aroused, initially sleepy but easily arousable and eyes open, able to follow all simple commands.  PERRL, b/l abduction not complete with improved nystagums b/l gaze and bidirectional.  Visual field full.  Right facial droop, tongue midline.  Facial sensation symmetrical.  Severe dysarthria. Moving all extremities symmetrically, sensation symmetrical.  Right arm finger-to-nose mild ataxia.  Heel-to-shin not corporative.  Gait not tested.   ASSESSMENT/PLAN Mr. Marvin Mendez is a 72 y.o. male with history of hypertension, postural hypotension on Florinef, CKD, thrombocytopenia admitted for generalized weakness and lethargy started 5:30 PM 02/13/2018. No tPA given due to [place].     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 Ellene Route and he will follow up to repair AVM once pt stable  Resultant lethargy, nystagmus, right arm ataxia, respiratory distress  CT head cerebellar ICH with SDH  Repeat CT showed decreased right cerebellum and vermis ICH, stable right posterior fossa SDH MRI with and without contrast  No significant interval change in size and appearance of evolving intraparenchymal hemorrhage involving the right cerebellum and cerebellar vermis. Similar regional mass effect without.Associated small right posterior fossa and posterior right cerebral convexity subdural hematoma without significant mass effect, stable. Persistent small volume underlying subarachnoid hemorrhage. 9 mm acute ischemic nonhemorrhagic left cerebellar infarct adjacent to the cerebellar hemorrhage.Small left cerebellar AVM, presumably the source of hemorrhage.hydrocephalus or herniation.  CTA head and neck concerning left cerebellar AVM - Dr. Ellene Route will take care of it once pt stable  2D Echo  Left ventricular ejection fraction 55-60%. No wall motion abnormalities.Reubin Milan rule out echodense mass on right cuspid valve in apical views  LDL 76  HgbA1c 5.5  SCDs for VTE prophylaxis  NPO  No antithrombotic prior to admission, now on No antithrombotic.   Ongoing aggressive stroke risk factor management  Therapy recommendations:  Pending   Disposition:  Pending   Respiratory distress  Intubated on admission  Extubated 02/15/18  Pt still has mild respiratory distress with difficulty handling secretions  On breathing treatment  Chest PT  CCM on board  Cerebral edema  Posterior fossa mass-effect  No hydrocephalus on CT so far  CT head and neck showed stable hematoma and no hydro  MRI brain  02/17/18 shows stable appearance of the cerebellar hemorrhage and mass effect. As well as  small right posterior fossa and right cerebral convexity subdurals. 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 Small left cerebellar AVM  On 3% saline @ 75  Sodium 137->146->148  Sodium goal 150-155  Sodium check every 6 hours  Has central line placement  Hypertension . Stable  BP goal < 140  On low dose cleviprex   Consider po BP meds once pass swallow  Hyperlipidemia  Home meds:  none   LDL 76, goal < 70  No statin at this time  Dysphagia  Did not pass swallow  MBS today  If not passing swallow, may consider cortrak today  CKD  Creatinine 1.5->1.5->1.34  On IV fluid  BMP monitoring  Other Stroke Risk Factors  Advanced age  Other Active Problems  Hyperglycemia, improved  Postural hypotension on Florinef at home  Thrombocytopenia platelet 147-> 138  Hospital day # 3  I had a long discussion with the patient's grandson at the bedside and explained evaluation and treatment plan. Recommend elective diagnostic cerebral catheter angiogram followed by endovascular treatment after he recovers in the rehabilitation phase.. Minimize sedation and restraints. Discontinue Ativan as patient seemed sensitive to it and may use when necessary Haldol. Discussed with Dr. Loanne Drilling.Continue systolic blood pressure goal below 160   Use when necessary labetalol and hydralazine. Discontinue hypertonic saline drip I'll let sodium gradually normalize. Do not use D5W or hypotonic fluids due to fear of worsening cerebral edema. This patient is critically ill due to cerebellar ICH, posterior fossa SDH, cerebral edema, respiratory distress hypertensive emergency and at significant risk of neurological worsening, death form hematoma expansion, cerebral edema, brain herniation, seizure, respiratory failure. This patient's care requires constant monitoring of vital signs, hemodynamics, respiratory and cardiac monitoring, review of multiple databases, neurological  assessment, discussion with family, other specialists and medical decision making of high complexity. I spent 35 minutes of neurocritical care time in the care of this patient. I had long discussion with niece and nephrew in law at bedside, updated pt current condition, treatment plan and potential prognosis. They expressed understanding and appreciation.   Antony Contras, MD Stroke Neurology 02/18/2018 1:02 PM    To contact Stroke Continuity provider, please refer to [email or web address] After hours, contact General Neurology

## 2018-02-18 NOTE — Progress Notes (Signed)
Pt with continued agitation CT scan ordered in AM. Neuro MD made aware and new orders in [facility].

## 2018-02-18 NOTE — Progress Notes (Signed)
NAME:  Marvin Mendez, MRN:  [id number], DOB:  May 09, 1945, LOS: 3 ADMISSION DATE:  03/06/2018, CONSULTATION DATE:  12/5 REFERRING MD:  Dr. Ellender Hose EDP, CHIEF COMPLAINT:  ICH   Brief History   72 year old male admitted for cerebellar ICH on 12/5. Intubated in ED for airway protection.  History of present illness   Patient is encephalopathic and/or intubated. Therefore history has been obtained from chart review.  72 year old male with PMH as below, which is significant for HTN, CKD 3, anemia (b12 deficient), thrombocytopenia, and autonomic postural hypotension (on florinef). He presented to outside hospital 12/4 with complaints of general malaise. While in ED there, he became progressively lethargic and required intubated for airway protection. CT of the head demonstrated cerebellar hemorrhage with evidence of tonsillar herniation. He was transferred to [facility] ED for neurosurgical evaluation. He was then seen by Dr. Ellene Route in ED, and was deemed to not be a surgical candidate and perhaps even a futile situation. PCCM asked to admit.   Of note. Family reports he is a very active man. Works out several times per week. Is very strong and independent.   Past Medical History   has a past medical history of Anemia, Hypertension, and Renal disorder.  Significant Hospital Events   12/5 - admitted  Consults:  Neurosurgery - 12/5 Neurology - 12/5  Procedures:  ETT 12/4 > 02/15/2018  Significant Diagnostic Tests:  CT head 12/4 > acute IPH in the central cerebellum with estimated volume 29cc. Assocaited edema and mass effect with probable early herniation. Subarachnoid extension. Possible intraventricular extension into fourth ventricle.  CT head 12/5 >>> with decreased central cerebellar hemorrhage to 15 cc. CT Head 12/8>>> 1. Interval decrease in size of evolving acute central cerebellar hematoma (8 cc, previously 16 cc). Persistent regional mass effect without obstructive hydrocephalus. 2.  Persistent small subdural hematoma overlying the right posterior fossa and along the falx and tentorium without mass effect, similar to previous. Persistent trace subarachnoid hemorrhage.   Micro Data:    Antimicrobials:    Interim history/subjective:  Very agitated overnight requiring ativan and haldol. Now improved.  Slight tachypnea but sats good, strong cough.   Objective   Blood pressure (!) 166/84, pulse 98, temperature 99.7 F (37.6 C), temperature source Axillary, resp. rate (!) 27, height 6' (1.829 m), weight 95.4 kg, SpO2 96 %.        Intake/Output Summary (Last 24 hours) at 02/18/2018 1541 Last data filed at 02/18/2018 0400 Gross per 24 hour  Intake 124.06 ml  Output 700 ml  Net -575.94 ml   Filed Weights   02/15/18 0200 02/16/18 0450 02/18/18 0630  Weight: 91 kg 94.5 kg 95.4 kg    Examination: General: Well-nourished well-developed male no acute distress HEENT: No JVD or lymphadenopathy is appreciated Neuro: somnolent but arousable, agitation overnight, protecting airway  CV: Heart sounds are regular PULM: even/non-labored, strong cough  EN:IDPO, non-tender, bsx4 active  Extremities: warm/dry, 1+ edema  Skin: no rashes or lesions    Resolved Hospital Problem list     Assessment & Plan:   Intracerebral Hemorrhage: Cerebellar bleed with subarachnoid and possibly intraventricular extension. He was reportedly on plavix at baseline. Not candidate for surgical intervention based per neurosurgery.  AMS PLAN -  -Continue to monitor intensive care unit -Appreciate neurology neurosurgery's input -Continue maintain goal systolic blood pressure less than 140 - monitor NA - 3% saline off   Inability to protect airway:  Intubated on admit.  Extubated  12/5.  Some concern for ongoing airway protection and ?aspiration .  PLAN-  Aggressive pulmonary hygiene as able  Aspiration precautions  SLP eval  Avoid oversedation   ?aspiration  PLAN -  Trend WBC  F/u  CXR in am  Pulmonary hygiene as above  Trend fever curve  Holding off on abx for now  CKD III: Creatinine on admision 1.55. No baseline to compare.  02/15/2018 creatinine is 1.50.  02/16/2018 creatinine 1.34 -Monitor renal function -Continue 3% saline per neurology  Postural hypotension -Currently on Florinef, continue to monitor blood pressure   Hyperglycemia without history of DM -Sliding scale insulin protocol  Best practice:  Diet: NPO, plan to advance once extubated Pain/Anxiety/Delirium protocol not indicated at this time VAP protocol (if indicated): N DVT prophylaxis: SCDs GI prophylaxis: Protonix Glucose control: SSI Mobility: Increase mobility Code Status: FULL Family Communication: family updated 12/8 by Dr. Loanne Drilling  Disposition: Remains intensive care unit for close monitoring respiratory status and AMS    Labs   CBC: Recent Labs  Lab 03/02/2018 2349 03/08/2018 2355 02/15/18 0326 02/17/18 0523  WBC 9.5  --  8.4 12.0*  NEUTROABS 7.3  --   --   --   HGB 12.4* 13.9 12.1* 11.4*  HCT 40.3 41.0 36.9* 36.0*  MCV 96.9  --  94.9 97.8  PLT 147*  --  138* 95*    Basic Metabolic Panel: Recent Labs  Lab 02/18/2018 2349 02/17/2018 2355 02/15/18 0326  02/16/18 0408  02/17/18 0523 02/17/18 0925 02/17/18 1415 02/17/18 2024 02/18/18 0250 02/18/18 0800  NA 139 139 137   < > 148*   < > 156* 156* 157* 159* 159* 159*  K 4.1 3.8 4.5  --  3.9  --  4.2  --   --   --   --   --   CL 110 112* 108  --  121*  --  128*  --   --   --   --   --   CO2 16*  --  16*  --  20*  --  20*  --   --   --   --   --   GLUCOSE 190* 194* 182*  --  121*  --  131*  --   --   --   --   --   BUN 20 22 21   --  14  --  14  --   --   --   --   --   CREATININE 1.55* 1.50* 1.50*  --  1.34*  --  1.62*  --   --   --   --   --   CALCIUM 8.8*  --  8.8*  --  8.8*  --  8.8*  --   --   --   --   --   MG  --   --  2.0  --   --   --  2.0  --   --   --   --   --   PHOS  --   --  2.6  --  1.5*  --  2.1*  --   --    --   --   --    < > = values in this interval not displayed.   GFR: Estimated Creatinine Clearance: 47.9 mL/min (A) (by C-G formula based on SCr of 1.62 mg/dL (H)). Recent Labs  Lab 02/18/2018 2349 02/15/18 0326 02/17/18 0523  WBC 9.5 8.4 12.0*  Liver Function Tests: No results for input(s): AST, ALT, ALKPHOS, BILITOT, PROT, ALBUMIN in the last 168 hours. No results for input(s): LIPASE, AMYLASE in the last 168 hours. No results for input(s): AMMONIA in the last 168 hours.  ABG    Component Value Date/Time   PHART 7.270 (L) 02/15/2018 0122   PCO2ART 40.6 02/15/2018 0122   PO2ART 402.0 (H) 02/15/2018 0122   HCO3 18.6 (L) 02/15/2018 0122   TCO2 20 (L) 02/15/2018 0122   ACIDBASEDEF 8.0 (H) 02/15/2018 0122   O2SAT 100.0 02/15/2018 0122     Coagulation Profile: Recent Labs  Lab 02/24/2018 2349  INR 1.08    Cardiac Enzymes: No results for input(s): CKTOTAL, CKMB, CKMBINDEX, TROPONINI in the last 168 hours.  HbA1C: Hgb A1c MFr Bld  Date/Time Value Ref Range Status  02/21/2018 11:49 PM 5.5 4.8 - 5.6 % Final    Comment:    (NOTE) Pre diabetes:          5.7%-6.4% Diabetes:              >6.4% Glycemic control for   <7.0% adults with diabetes     CBG: Recent Labs  Lab 02/17/18 1602 02/17/18 2022 02/18/18 0009 02/18/18 0818 02/18/18 1202  GLUCAP 135* 110* 120* 97 100*     Critical care time: 30 mins     Nickolas Madrid, NP 02/18/2018  3:51 PM Pager: [phone] or [phone]

## 2018-02-18 NOTE — Progress Notes (Signed)
eLink Physician-Brief Progress Note Patient Name: RION CATALA DOB: [birth year] MRN: [id number]   Date of Service  02/18/2018  HPI/Events of Note  Nursing concerned about respiratory status. RR= 28 and Sat = 96% on # L/min [place] O2.   eICU Interventions  Will order: 1. ABG STAT.     Intervention Category Major Interventions: Other:  Lysle Dingwall 02/18/2018, 11:24 PM

## 2018-02-18 NOTE — Progress Notes (Signed)
Patient ID: Marvin Mendez, male   DOB: 08-Jul-1945, 72 y.o.   MRN: [id number] Patient recently received Ativan very somnolent but does arouse does follow commands  CT scan improved sodium 159  We'll give him some D5W to help bring a sodium back in the mid 150s.

## 2018-02-19 ENCOUNTER — Inpatient Hospital Stay (HOSPITAL_COMMUNITY): Payer: PPO

## 2018-02-19 LAB — POCT I-STAT 3, ART BLOOD GAS (G3+)
ACID-BASE DEFICIT: 1 mmol/L (ref 0.0–2.0)
Bicarbonate: 24.6 mmol/L (ref 20.0–28.0)
O2 Saturation: 100 %
PH ART: 7.327 — AB (ref 7.350–7.450)
Patient temperature: 98.6
TCO2: 26 mmol/L (ref 22–32)
pCO2 arterial: 46.9 mmHg (ref 32.0–48.0)
pO2, Arterial: 452 mmHg — ABNORMAL HIGH (ref 83.0–108.0)

## 2018-02-19 LAB — CBC WITH DIFFERENTIAL/PLATELET
Abs Immature Granulocytes: 0.09 10*3/uL — ABNORMAL HIGH (ref 0.00–0.07)
Basophils Absolute: 0 10*3/uL (ref 0.0–0.1)
Basophils Relative: 0 %
EOS PCT: 0 %
Eosinophils Absolute: 0 10*3/uL (ref 0.0–0.5)
HCT: 37.8 % — ABNORMAL LOW (ref 39.0–52.0)
Hemoglobin: 12 g/dL — ABNORMAL LOW (ref 13.0–17.0)
Immature Granulocytes: 1 %
Lymphocytes Relative: 12 %
Lymphs Abs: 1 10*3/uL (ref 0.7–4.0)
MCH: 30.2 pg (ref 26.0–34.0)
MCHC: 31.7 g/dL (ref 30.0–36.0)
MCV: 95.2 fL (ref 80.0–100.0)
MONO ABS: 0.7 10*3/uL (ref 0.1–1.0)
Monocytes Relative: 8 %
Neutro Abs: 6.8 10*3/uL (ref 1.7–7.7)
Neutrophils Relative %: 79 %
Platelets: 84 10*3/uL — ABNORMAL LOW (ref 150–400)
RBC: 3.97 MIL/uL — ABNORMAL LOW (ref 4.22–5.81)
RDW: 13.6 % (ref 11.5–15.5)
WBC: 8.7 10*3/uL (ref 4.0–10.5)
nRBC: 0.5 % — ABNORMAL HIGH (ref 0.0–0.2)

## 2018-02-19 LAB — COMPREHENSIVE METABOLIC PANEL
ALK PHOS: 52 U/L (ref 38–126)
ALT: 27 U/L (ref 0–44)
AST: 48 U/L — ABNORMAL HIGH (ref 15–41)
Albumin: 2.8 g/dL — ABNORMAL LOW (ref 3.5–5.0)
Anion gap: 8 (ref 5–15)
BUN: 23 mg/dL (ref 8–23)
CO2: 22 mmol/L (ref 22–32)
Calcium: 8.7 mg/dL — ABNORMAL LOW (ref 8.9–10.3)
Chloride: 129 mmol/L — ABNORMAL HIGH (ref 98–111)
Creatinine, Ser: 1.61 mg/dL — ABNORMAL HIGH (ref 0.61–1.24)
GFR calc Af Amer: 48 mL/min — ABNORMAL LOW (ref >60)
GFR calc non Af Amer: 42 mL/min — ABNORMAL LOW (ref >60)
Glucose, Bld: 128 mg/dL — ABNORMAL HIGH (ref 70–99)
Potassium: 3.2 mmol/L — ABNORMAL LOW (ref 3.5–5.1)
Sodium: 159 mmol/L — ABNORMAL HIGH (ref 135–145)
Total Bilirubin: 1.3 mg/dL — ABNORMAL HIGH (ref 0.3–1.2)
Total Protein: 6.9 g/dL (ref 6.5–8.1)

## 2018-02-19 LAB — LACTIC ACID, PLASMA: Lactic Acid, Venous: 1 mmol/L (ref 0.5–1.9)

## 2018-02-19 LAB — GLUCOSE, CAPILLARY
GLUCOSE-CAPILLARY: 109 mg/dL — AB (ref 70–99)
Glucose-Capillary: 103 mg/dL — ABNORMAL HIGH (ref 70–99)
Glucose-Capillary: 104 mg/dL — ABNORMAL HIGH (ref 70–99)
Glucose-Capillary: 107 mg/dL — ABNORMAL HIGH (ref 70–99)
Glucose-Capillary: 109 mg/dL — ABNORMAL HIGH (ref 70–99)
Glucose-Capillary: 111 mg/dL — ABNORMAL HIGH (ref 70–99)
Glucose-Capillary: 117 mg/dL — ABNORMAL HIGH (ref 70–99)

## 2018-02-19 LAB — PROCALCITONIN: Procalcitonin: 0.12 ng/mL

## 2018-02-19 LAB — SODIUM: Sodium: 160 mmol/L — ABNORMAL HIGH (ref 135–145)

## 2018-02-19 MED ORDER — POTASSIUM CHLORIDE 20 MEQ PO PACK
40.0000 meq | PACK | Freq: Once | ORAL | Status: AC
  Administered 2018-02-19: 40 meq via ORAL
  Filled 2018-02-19: qty 2

## 2018-02-19 MED ORDER — PANTOPRAZOLE SODIUM 40 MG IV SOLR: 40.0000 mg | Freq: Once | INTRAVENOUS | Status: DC

## 2018-02-19 MED ORDER — ORAL CARE MOUTH RINSE
15.0000 mL | OROMUCOSAL | Status: DC
  Administered 2018-02-19 – 2018-02-27 (×73): 15 mL via OROMUCOSAL

## 2018-02-19 MED ORDER — MIDAZOLAM HCL 2 MG/2ML IJ SOLN
1.0000 mg | INTRAMUSCULAR | Status: DC | PRN
  Administered 2018-02-19: 1 mg via INTRAVENOUS
  Filled 2018-02-19: qty 2

## 2018-02-19 MED ORDER — CALCIUM GLUCONATE-NACL 2-0.675 GM/100ML-% IV SOLN
2.0000 g | Freq: Once | INTRAVENOUS | Status: AC
  Administered 2018-02-19: 2000 mg via INTRAVENOUS
  Filled 2018-02-19: qty 100

## 2018-02-19 MED ORDER — SENNOSIDES 8.8 MG/5ML PO SYRP
5.0000 mL | ORAL_SOLUTION | Freq: Two times a day (BID) | ORAL | Status: DC | PRN
  Administered 2018-02-21: 5 mL
  Filled 2018-02-19: qty 5

## 2018-02-19 MED ORDER — VANCOMYCIN HCL 10 G IV SOLR
1500.0000 mg | INTRAVENOUS | Status: DC
  Administered 2018-02-19 – 2018-02-21 (×3): 1500 mg via INTRAVENOUS
  Filled 2018-02-19 (×3): qty 1500

## 2018-02-19 MED ORDER — FENTANYL 2500MCG IN NS 250ML (10MCG/ML) PREMIX INFUSION
25.0000 ug/h | INTRAVENOUS | Status: DC
  Administered 2018-02-19: 50 ug/h via INTRAVENOUS
  Administered 2018-02-20: 75 ug/h via INTRAVENOUS
  Filled 2018-02-19 (×2): qty 250

## 2018-02-19 MED ORDER — MIDAZOLAM HCL 2 MG/2ML IJ SOLN
2.0000 mg | Freq: Once | INTRAMUSCULAR | Status: AC
  Administered 2018-02-19: 2 mg via INTRAVENOUS

## 2018-02-19 MED ORDER — ROCURONIUM BROMIDE 50 MG/5ML IV SOLN
60.0000 mg | Freq: Once | INTRAVENOUS | Status: AC
  Administered 2018-02-19: 60 mg via INTRAVENOUS

## 2018-02-19 MED ORDER — ETOMIDATE 2 MG/ML IV SOLN
20.0000 mg | Freq: Once | INTRAVENOUS | Status: AC
  Administered 2018-02-19: 20 mg via INTRAVENOUS

## 2018-02-19 MED ORDER — FENTANYL CITRATE (PF) 100 MCG/2ML IJ SOLN
INTRAMUSCULAR | Status: AC
  Filled 2018-02-19: qty 2

## 2018-02-19 MED ORDER — MIDAZOLAM HCL 2 MG/2ML IJ SOLN
1.0000 mg | INTRAMUSCULAR | Status: DC | PRN
  Administered 2018-02-24 – 2018-02-26 (×2): 1 mg via INTRAVENOUS
  Filled 2018-02-19 (×2): qty 2

## 2018-02-19 MED ORDER — FENTANYL BOLUS VIA INFUSION
25.0000 ug | INTRAVENOUS | Status: DC | PRN
  Administered 2018-02-19 – 2018-02-20 (×2): 25 ug via INTRAVENOUS
  Filled 2018-02-19: qty 25

## 2018-02-19 MED ORDER — FENTANYL CITRATE (PF) 100 MCG/2ML IJ SOLN: 50.0000 ug | Freq: Once | INTRAMUSCULAR | Status: AC

## 2018-02-19 MED ORDER — MIDAZOLAM HCL 2 MG/2ML IJ SOLN
INTRAMUSCULAR | Status: AC
  Filled 2018-02-19: qty 2

## 2018-02-19 MED ORDER — CHLORHEXIDINE GLUCONATE 0.12% ORAL RINSE (MEDLINE KIT): 15.0000 mL | Freq: Two times a day (BID) | OROMUCOSAL | Status: DC

## 2018-02-19 MED ORDER — SODIUM CHLORIDE 0.9 % IV SOLN
1.0000 g | Freq: Three times a day (TID) | INTRAVENOUS | Status: DC
  Administered 2018-02-19 – 2018-02-20 (×4): 1 g via INTRAVENOUS
  Filled 2018-02-19 (×5): qty 1

## 2018-02-19 MED ORDER — FENTANYL CITRATE (PF) 100 MCG/2ML IJ SOLN
50.0000 ug | Freq: Once | INTRAMUSCULAR | Status: AC
  Administered 2018-02-19: 50 ug via INTRAVENOUS

## 2018-02-19 MED ORDER — PANTOPRAZOLE SODIUM 40 MG IV SOLR
40.0000 mg | INTRAVENOUS | Status: DC
  Administered 2018-02-20 – 2018-02-25 (×6): 40 mg via INTRAVENOUS
  Filled 2018-02-19 (×6): qty 40

## 2018-02-19 NOTE — Progress Notes (Signed)
OT Cancellation Note  Patient Details Name: Marvin Mendez MRN: [id number] DOB: [birth year]   Cancelled Treatment:    Reason Eval/Treat Not Completed: Medical issues which prohibited therapy. Pt in respiratory distress, RR 33-38.  Will continue to follow and initiate OT evaluation as appropriate and able.   Delight Stare, OT Acute Rehabilitation Services Pager [phone] Office [phone]    Delight Stare 02/19/2018, 11:23 AM

## 2018-02-19 NOTE — Progress Notes (Signed)
PT Cancellation Note  Patient Details Name: BAIRON KLEMANN MRN: [id number] DOB: 1945-03-25   Cancelled Treatment:    Reason Eval/Treat Not Completed: Medical issues which prohibited therapy(pt with RR 33-38 with agonal respirations and concern for pending intubation)   Lucia Mccreadie B Tela Kotecki 02/19/2018, 11:21 AM  Elwyn Reach, PT Acute Rehabilitation Services Pager: [phone] Office: [phone]

## 2018-02-19 NOTE — Progress Notes (Signed)
Pharmacy Antibiotic Note  Marvin Mendez is a 72 y.o. male admitted on 03/10/2018 with pneumonia.  Pharmacy has been consulted for Vancomycin and Cefepime dosing.  Plan: Cefepime 1 gram IV q8hr Vancomycin 1500 mg IV q24hr (AUC approx 500) Will monitor renal function, C & S and vancomycin levels as indicated.  Height: 6' (182.9 cm) Weight: 201 lb 1 oz (91.2 kg) IBW/kg (Calculated) : 77.6  Temp (24hrs), Avg:99.5 F (37.5 C), Min:98.8 F (37.1 C), Max:99.8 F (37.7 C)  Recent Labs  Lab 03/06/2018 2349 03/13/2018 2355 02/15/18 0326 02/16/18 0408 02/17/18 0523  WBC 9.5  --  8.4  --  12.0*  CREATININE 1.55* 1.50* 1.50* 1.34* 1.62*    Estimated Creatinine Clearance: 43.9 mL/min (A) (by C-G formula based on SCr of 1.62 mg/dL (H)).    Allergies  Allergen Reactions  . Midodrine   . Aspirin Hives  . Codeine Hives    Antimicrobials this admission: Cefepime 12/9 >>  Vanco 12/9 >>   Thank you for allowing pharmacy to be a part of this patient's care.  Alanda Slim, PharmD, [facility] Clinical Pharmacist Please see AMION for all Pharmacists' Contact Phone Numbers 02/19/2018, 10:14 AM

## 2018-02-19 NOTE — Progress Notes (Signed)
PCCM progress note  Discussed in detail with family before intubation They are okay with short-term support but are clear that if he does not turnaround there would not want tracheostomy In case of cardiac arrest they do not want resuscitation or chest compressions or CODE BLUE called CODE STATUS changed to limited code.  Marshell Garfinkel MD [facility] 02/19/2018, 6:45 PM

## 2018-02-19 NOTE — Progress Notes (Signed)
NAME:  Marvin Mendez, MRN:  [id number], DOB:  12/12/1945, LOS: 4 ADMISSION DATE:  02/13/2018, CONSULTATION DATE:  12/5 REFERRING MD:  Dr. Ellender Hose EDP, CHIEF COMPLAINT:  ICH   Brief History   72 year old male admitted for cerebellar ICH on 12/5. Intubated in ED for airway protection.  History of present illness   Patient is encephalopathic and/or intubated. Therefore history has been obtained from chart review.  72 year old male with PMH as below, which is significant for HTN, CKD 3, anemia (b12 deficient), thrombocytopenia, and autonomic postural hypotension (on florinef). He presented to outside hospital 12/4 with complaints of general malaise. While in ED there, he became progressively lethargic and required intubated for airway protection. CT of the head demonstrated cerebellar hemorrhage with evidence of tonsillar herniation. He was transferred to [facility] ED for neurosurgical evaluation. He was then seen by Dr. Ellene Route in ED, and was deemed to not be a surgical candidate and perhaps even a futile situation. PCCM asked to admit.   Of note. Family reports he is a very active man. Works out several times per week. Is very strong and independent.   Past Medical History   has a past medical history of Anemia, Hypertension, and Renal disorder.  Significant Hospital Events   12/5 - admitted  Consults:  Neurosurgery - 12/5 Neurology - 12/5  Procedures:  ETT 12/4 > 02/15/2018  Significant Diagnostic Tests:  CT head 12/4 > acute IPH in the central cerebellum with estimated volume 29cc. Assocaited edema and mass effect with probable early herniation. Subarachnoid extension. Possible intraventricular extension into fourth ventricle.  CT head 12/5 >>> with decreased central cerebellar hemorrhage to 15 cc. CT Head 12/8>>> 1. Interval decrease in size of evolving acute central cerebellar hematoma (8 cc, previously 16 cc). Persistent regional mass effect without obstructive hydrocephalus. 2.  Persistent small subdural hematoma overlying the right posterior fossa and along the falx and tentorium without mass effect, similar to previous. Persistent trace subarachnoid hemorrhage.   Micro Data:    Antimicrobials:  Vanco 12/9 > Cefepime 12/19>  Interim history/subjective:  Continues to be agitated overnight.  Working hard to breathe today morning with gurgling in the throat.   Objective   Blood pressure (!) 158/80, pulse (!) 102, temperature 98.8 F (37.1 C), temperature source Axillary, resp. rate (!) 35, height 6' (1.829 m), weight 91.2 kg, SpO2 96 %.        Intake/Output Summary (Last 24 hours) at 02/19/2018 0934 Last data filed at 02/19/2018 0800 Gross per 24 hour  Intake 148.3 ml  Output 900 ml  Net -751.7 ml   Filed Weights   02/16/18 0450 02/18/18 0630 02/19/18 0211  Weight: 94.5 kg 95.4 kg 91.2 kg    Examination: Blood pressure (!) 158/80, pulse (!) 102, temperature 98.8 F (37.1 C), temperature source Axillary, resp. rate (!) 35, height 6' (1.829 m), weight 91.2 kg, SpO2 96 %. Gen:      Moderate distress HEENT:  EOMI, sclera anicteric Neck:     No masses; no thyromegaly Lungs:   Bilateral rhonchi. CV:         Regular rate and rhythm; no murmurs Abd:      + bowel sounds; soft, non-tender; no palpable masses, no distension Ext:    No edema; adequate peripheral perfusion Skin:      Warm and dry; no rash Neuro: Opens eyes to commands.  Otherwise non-directable.  Resolved Hospital Problem list     Assessment & Plan:  Intracerebral Hemorrhage: Cerebellar bleed with subarachnoid and possibly intraventricular extension. He was reportedly on plavix at baseline. Not candidate for surgical intervention based per neurosurgery.  AMS PLAN -  Continue monitoring in ICU Appreciate neurology, neurosurgery input Off 3% saline.  Monitor sodium.  Inability to protect airway:  Intubated on admit.  Extubated 12/5.  Some concern for ongoing airway protection and  ?aspiration .  PLAN-  Likely headed towards intubation Keep n.p.o. Will discuss with family about goals of care.  ?aspiration  PLAN -  Follow procalcitonin, cultures Start antibiotics for hospital-acquired pneumonia.  CKD III: Creatinine on admision 1.55. No baseline to compare.  02/15/2018 creatinine is 1.50.  02/16/2018 creatinine 1.34 Follow urine output and creatinine.  Postural hypotension Currently on Florinef, continue to monitor blood pressure  Hyperglycemia without history of DM SSI  Best practice:  Diet: NPO Pain/Anxiety/Delirium protocol not indicated at this time VAP protocol (if indicated): N DVT prophylaxis: SCDs GI prophylaxis: Protonix Glucose control: SSI Mobility: Increase mobility Code Status: FULL Family Communication: family updated 12/8 by Dr. Loanne Drilling.  Needs updated 12/9. Disposition: Remains intensive care unit for close monitoring respiratory status and AMS   Labs   CBC: Recent Labs  Lab 02/28/2018 2349 02/20/2018 2355 02/15/18 0326 02/17/18 0523  WBC 9.5  --  8.4 12.0*  NEUTROABS 7.3  --   --   --   HGB 12.4* 13.9 12.1* 11.4*  HCT 40.3 41.0 36.9* 36.0*  MCV 96.9  --  94.9 97.8  PLT 147*  --  138* 95*    Basic Metabolic Panel: Recent Labs  Lab 02/16/2018 2349 03/10/2018 2355 02/15/18 0326  02/16/18 0408  02/17/18 0523  02/17/18 1415 02/17/18 2024 02/18/18 0250 02/18/18 0800 02/19/18 0525  NA 139 139 137   < > 148*   < > 156*   < > 157* 159* 159* 159* 160*  K 4.1 3.8 4.5  --  3.9  --  4.2  --   --   --   --   --   --   CL 110 112* 108  --  121*  --  128*  --   --   --   --   --   --   CO2 16*  --  16*  --  20*  --  20*  --   --   --   --   --   --   GLUCOSE 190* 194* 182*  --  121*  --  131*  --   --   --   --   --   --   BUN 20 22 21   --  14  --  14  --   --   --   --   --   --   CREATININE 1.55* 1.50* 1.50*  --  1.34*  --  1.62*  --   --   --   --   --   --   CALCIUM 8.8*  --  8.8*  --  8.8*  --  8.8*  --   --   --   --   --   --     MG  --   --  2.0  --   --   --  2.0  --   --   --   --   --   --   PHOS  --   --  2.6  --  1.5*  --  2.1*  --   --   --   --   --   --    < > =  values in this interval not displayed.   GFR: Estimated Creatinine Clearance: 43.9 mL/min (A) (by C-G formula based on SCr of 1.62 mg/dL (H)). Recent Labs  Lab 02/18/2018 2349 02/15/18 0326 02/17/18 0523  WBC 9.5 8.4 12.0*    Liver Function Tests: No results for input(s): AST, ALT, ALKPHOS, BILITOT, PROT, ALBUMIN in the last 168 hours. No results for input(s): LIPASE, AMYLASE in the last 168 hours. No results for input(s): AMMONIA in the last 168 hours.  ABG    Component Value Date/Time   PHART 7.361 02/18/2018 2333   PCO2ART 34.9 02/18/2018 2333   PO2ART 73.0 (L) 02/18/2018 2333   HCO3 19.6 (L) 02/18/2018 2333   TCO2 21 (L) 02/18/2018 2333   ACIDBASEDEF 5.0 (H) 02/18/2018 2333   O2SAT 93.0 02/18/2018 2333     Coagulation Profile: Recent Labs  Lab 02/12/2018 2349  INR 1.08    Cardiac Enzymes: No results for input(s): CKTOTAL, CKMB, CKMBINDEX, TROPONINI in the last 168 hours.  HbA1C: Hgb A1c MFr Bld  Date/Time Value Ref Range Status  02/21/2018 11:49 PM 5.5 4.8 - 5.6 % Final    Comment:    (NOTE) Pre diabetes:          5.7%-6.4% Diabetes:              >6.4% Glycemic control for   <7.0% adults with diabetes     CBG: Recent Labs  Lab 02/18/18 1626 02/18/18 1946 02/18/18 2327 02/19/18 0324 02/19/18 0808  GLUCAP 103* 112* 102* 109* 104*    The patient is critically ill with multiple organ system failure and requires high complexity decision making for assessment and support, frequent evaluation and titration of therapies, advanced monitoring, review of radiographic studies and interpretation of complex data.   Critical Care Time devoted to patient care services, exclusive of separately billable procedures, described in this note is 35 minutes.   Marshell Garfinkel MD [facility] Pager [phone] If no answer or after 3pm call: [phone] 02/19/2018, 9:34 AM

## 2018-02-19 NOTE — Progress Notes (Signed)
SLP Cancellation Note  Patient Details Name: Marvin Mendez MRN: [id number] DOB: 02/18/1946   Cancelled treatment:       Reason Eval/Treat Not Completed: Medical issues which prohibited therapy. Pt in respiratory distress. Will follow for needs.    Jennifer Holland, Katherene Ponto 02/19/2018, 10:47 AM

## 2018-02-19 NOTE — Procedures (Signed)
Intubation Procedure Note Marvin Mendez [id number] [birth year]  Procedure: Intubation Indications: Respiratory insufficiency  Procedure Details Consent: Risks of procedure as well as the alternatives and risks of each were explained to the (patient/caregiver).  Consent for procedure obtained. Time Out: Verified patient identification, verified procedure, site/side was marked, verified correct patient position, special equipment/implants available, medications/allergies/relevent history reviewed, required imaging and test results available.  Performed  Maximum sterile technique was used including cap, gloves, gown and hand hygiene.  Glide scope size 3. 1 attempt   Evaluation Hemodynamic Status: BP stable throughout; O2 sats: stable throughout and transiently fell during during procedure Patient's Current Condition: stable Complications: No apparent complications Patient did tolerate procedure well. Chest X-ray ordered to verify placement.  CXR: pending.   Marvin Mendez 02/19/2018

## 2018-02-19 NOTE — Progress Notes (Signed)
STROKE TEAM PROGRESS NOTE   SUBJECTIVE (INTERVAL HISTORY) His RN is at the bedside.    He  Continues to not do well from the respiratory standpoint and is quite tachypneic this morning with lot of upper airway secretions. He is on 6 L oxygen and make maintaining his sats so far but likely to get exhausted and need intubation soon. Serum sodium is yet high at 160 and hypertonic saline has been discontinued. Patient has been unable to swallow so far. Neurologically he is sleepy but can be easily aroused and follows commands well. Blood pressure has been adequately controlled but he was restarted on Cardene drip last night OBJECTIVE Temp:  [98.8 F (37.1 C)-99.8 F (37.7 C)] 99.1 F (37.3 C) (12/09 1201) Pulse Rate:  [85-110] 95 (12/09 1220) Cardiac Rhythm: Sinus tachycardia (12/09 0800) Resp:  [18-38] 18 (12/09 1220) BP: (96-183)/(65-137) 96/65 (12/09 1220) SpO2:  [90 %-100 %] 100 % (12/09 1220) FiO2 (%):  [50 %-100 %] 50 % (12/09 1337) Weight:  [91.2 kg] 91.2 kg (12/09 0211)  Recent Labs  Lab 02/18/18 1946 02/18/18 2327 02/19/18 0324 02/19/18 0808 02/19/18 1140  GLUCAP 112* 102* 109* 104* 109*   Recent Labs  Lab 02/20/2018 2349 03/08/2018 2355 02/15/18 0326  02/16/18 0408  02/17/18 0523  02/17/18 2024 02/18/18 0250 02/18/18 0800 02/19/18 0525 02/19/18 1009  NA 139 139 137   < > 148*   < > 156*   < > 159* 159* 159* 160* 159*  K 4.1 3.8 4.5  --  3.9  --  4.2  --   --   --   --   --  3.2*  CL 110 112* 108  --  121*  --  128*  --   --   --   --   --  129*  CO2 16*  --  16*  --  20*  --  20*  --   --   --   --   --  22  GLUCOSE 190* 194* 182*  --  121*  --  131*  --   --   --   --   --  128*  BUN 20 22 21   --  14  --  14  --   --   --   --   --  23  CREATININE 1.55* 1.50* 1.50*  --  1.34*  --  1.62*  --   --   --   --   --  1.61*  CALCIUM 8.8*  --  8.8*  --  8.8*  --  8.8*  --   --   --   --   --  8.7*  MG  --   --  2.0  --   --   --  2.0  --   --   --   --   --   --   PHOS  --    --  2.6  --  1.5*  --  2.1*  --   --   --   --   --   --    < > = values in this interval not displayed.   Recent Labs  Lab 02/19/18 1009  AST 48*  ALT 27  ALKPHOS 52  BILITOT 1.3*  PROT 6.9  ALBUMIN 2.8*   Recent Labs  Lab 03/08/2018 2349 02/25/2018 2355 02/15/18 0326 02/17/18 0523 02/19/18 1009  WBC 9.5  --  8.4 12.0* 8.7  NEUTROABS 7.3  --   --   --  6.8  HGB 12.4* 13.9 12.1* 11.4* 12.0*  HCT 40.3 41.0 36.9* 36.0* 37.8*  MCV 96.9  --  94.9 97.8 95.2  PLT 147*  --  138* 95* 84*   No results for input(s): CKTOTAL, CKMB, CKMBINDEX, TROPONINI in the last 168 hours. No results for input(s): LABPROT, INR in the last 72 hours. No results for input(s): COLORURINE, LABSPEC, [place], GLUCOSEU, HGBUR, BILIRUBINUR, KETONESUR, PROTEINUR, UROBILINOGEN, NITRITE, LEUKOCYTESUR in the last 72 hours.  Invalid input(s): APPERANCEUR     Component Value Date/Time   CHOL 134 02/16/2018 0408   TRIG 74 02/16/2018 0408   HDL 43 02/16/2018 0408   CHOLHDL 3.1 02/16/2018 0408   VLDL 15 02/16/2018 0408   LDLCALC 76 02/16/2018 0408   Lab Results  Component Value Date   HGBA1C 5.5 03/11/2018   No results found for: LABOPIA, COCAINSCRNUR, LABBENZ, AMPHETMU, THCU, LABBARB  No results for input(s): ETH in the last 168 hours.  I have personally reviewed the radiological images below and agree with the radiology interpretations.  Ct Angio Head W Or Wo Contrast  Result Date: 02/15/2018 CLINICAL DATA:  72 y/o  M; follow-up of intracranial hemorrhage. EXAM: CT ANGIOGRAPHY HEAD AND NECK TECHNIQUE: Multidetector CT imaging of the head and neck was performed using the standard protocol during bolus administration of intravenous contrast. Multiplanar CT image reconstructions and MIPs were obtained to evaluate the vascular anatomy. Carotid stenosis measurements (when applicable) are obtained utilizing NASCET criteria, using the distal internal carotid diameter as the denominator. CONTRAST:  43mL ISOVUE-370  IOPAMIDOL (ISOVUE-370) INJECTION 76% COMPARISON:  02/21/2018 CT head. FINDINGS: CTA NECK FINDINGS Aortic arch: Standard branching. Imaged portion shows no evidence of aneurysm or dissection. No significant stenosis of the major arch vessel origins. Right carotid system: No evidence of dissection, stenosis (50% or greater) or occlusion. Left carotid system: No evidence of dissection, stenosis (50% or greater) or occlusion. Vertebral arteries: Codominant. No evidence of dissection, stenosis (50% or greater) or occlusion. Skeleton: Mild-to-moderate cervical spondylosis with multilevel disc and facet degenerative changes. No high-grade bony canal stenosis. Other neck: Negative. Upper chest: Negative. Review of the MIP images confirms the above findings CTA HEAD FINDINGS Anterior circulation: No significant stenosis, proximal occlusion, aneurysm, or vascular malformation. Posterior circulation: Mild stenosis of the left vertebral artery at the left PICA origin. The left PICA asymmetrically enlarged with a branch extending to a cluster of vessels within the posteromedial aspect of the left cerebellar hemisphere spanning approximately 11 mm (series 12, image [id number] and series 13, image 105, also see sagittal MIPS). The right vertebral, basilar, and bilateral posterior cerebral arteries are unremarkable. Venous sinuses: Hyperdense prominent venous structure within the medial cerebellum, possibly a draining vein from the left cerebellar vascular malformation (series 12, image 22). Anatomic variants: None significant. Delayed phase: No abnormal intracranial enhancement. Review of the MIP images confirms the above findings IMPRESSION: CTA head: 1. Small group of abnormal vessels within the posteromedial aspect of left cerebellar hemisphere spanning approximately 11 mm, suspected arteriovenous malformation. 2. Mild stenosis of left vertebral artery and PICA origin. 3. No additional aneurysm, stenosis, vascular malformation, or  occlusion of the anterior and posterior circulation. CTA neck: Patent carotid and vertebral arteries of the neck. No dissection, aneurysm, or hemodynamically significant stenosis by NASCET criteria. These results will be called to the ordering clinician or representative by the Radiologist Assistant, and communication documented in the PACS or zVision Dashboard. Electronically Signed   By: Kristine Garbe M.D.   On: 02/15/2018 19:17  Ct Head Wo Contrast  Result Date: 02/18/2018 CLINICAL DATA:  Follow-up examination for intracranial hemorrhage. EXAM: CT HEAD WITHOUT CONTRAST TECHNIQUE: Contiguous axial images were obtained from the base of the skull through the vertex without intravenous contrast. COMPARISON:  Prior CT from 03/13/2018. FINDINGS: Brain: Involving central cerebellar hemorrhage decreased in size now measuring 2.5 x 2.7 x 2.5 cm (8.4 cc), previously 2.8 x 3.3 x 3.3 cm (16 cc). Subdural extension with small right parafalcine and tentorial hematoma again noted, similar. Subdural hematoma at the right posterior fossa little interval changed measuring up to 4-5 mm. Persistent localized mass effect with effacement of the suprasellar cistern. Mass effect on the adjacent fourth ventricle which remains partially effaced, relatively similar. No hydrocephalus. Trace subarachnoid hemorrhage within the right temporal occipital region persist. No other new acute intracranial abnormality. No acute large vessel territory infarct. Vascular: No hyperdense vessel. Skull: Scalp soft tissues and calvarium demonstrate no acute finding. Sinuses/Orbits: Globes and orbital soft tissues within normal limits. Scattered mucosal thickening throughout the ethmoidal air cells. Paranasal sinuses are otherwise grossly clear. No mastoid effusion. Other: None. IMPRESSION: 1. Interval decrease in size of evolving acute central cerebellar hematoma (8 cc, previously 16 cc). Persistent regional mass effect without obstructive  hydrocephalus. 2. Persistent small subdural hematoma overlying the right posterior fossa and along the falx and tentorium without mass effect, similar to previous. Persistent trace subarachnoid hemorrhage. Electronically Signed   By: Jeannine Boga M.D.   On: 02/18/2018 03:44   Ct Head Wo Contrast  Result Date: 02/15/2018 CLINICAL DATA:  Altered mental status. Follow-up intracranial hemorrhage. EXAM: CT HEAD WITHOUT CONTRAST TECHNIQUE: Contiguous axial images were obtained from the base of the skull through the vertex without intravenous contrast. COMPARISON:  CT HEAD February 14, 2018 at 1815 hours FINDINGS: BRAIN: Central cerebellar 2.8 x 3.3 x 3.3 cm (volume = 16 cc) intraparenchymal hematoma was 30 cc. Subdural extension with decreased 2-3 mm falcotentorial subdural hematoma. RIGHT posterior fossa subdural hematoma measuring to 4 mm tracking into the included cervical spine. Effaced supra cerebellar cistern. Regional mass effect resulting in narrowed fourth ventricle without hydrocephalus. Trace subarachnoid hemorrhage. No acute large vascular territory infarct. VASCULAR: Trace calcific atherosclerosis of the carotid siphons. SKULL: No skull fracture. No significant scalp soft tissue swelling. SINUSES/ORBITS: Mild paranasal sinus mucosal thickening. Mastoid air cells are well aerated.The included ocular globes and orbital contents are non-suspicious. OTHER: None. IMPRESSION: 1. Evolving acute central cerebellar hematoma (16 cc, previously 30 cc). Regional mass effect without obstructive hydrocephalus. 2. Decreased falcotentorial and RIGHT posterior fossa subdural hematomas extending into included cervical spine. Small volume subarachnoid hemorrhage. Electronically Signed   By: Elon Alas M.D.   On: 02/15/2018 01:16   Ct Angio Neck W Or Wo Contrast  Result Date: 02/15/2018 CLINICAL DATA:  72 y/o  M; follow-up of intracranial hemorrhage. EXAM: CT ANGIOGRAPHY HEAD AND NECK TECHNIQUE:  Multidetector CT imaging of the head and neck was performed using the standard protocol during bolus administration of intravenous contrast. Multiplanar CT image reconstructions and MIPs were obtained to evaluate the vascular anatomy. Carotid stenosis measurements (when applicable) are obtained utilizing NASCET criteria, using the distal internal carotid diameter as the denominator. CONTRAST:  42mL ISOVUE-370 IOPAMIDOL (ISOVUE-370) INJECTION 76% COMPARISON:  03/02/2018 CT head. FINDINGS: CTA NECK FINDINGS Aortic arch: Standard branching. Imaged portion shows no evidence of aneurysm or dissection. No significant stenosis of the major arch vessel origins. Right carotid system: No evidence of dissection, stenosis (50% or greater) or occlusion. Left carotid  system: No evidence of dissection, stenosis (50% or greater) or occlusion. Vertebral arteries: Codominant. No evidence of dissection, stenosis (50% or greater) or occlusion. Skeleton: Mild-to-moderate cervical spondylosis with multilevel disc and facet degenerative changes. No high-grade bony canal stenosis. Other neck: Negative. Upper chest: Negative. Review of the MIP images confirms the above findings CTA HEAD FINDINGS Anterior circulation: No significant stenosis, proximal occlusion, aneurysm, or vascular malformation. Posterior circulation: Mild stenosis of the left vertebral artery at the left PICA origin. The left PICA asymmetrically enlarged with a branch extending to a cluster of vessels within the posteromedial aspect of the left cerebellar hemisphere spanning approximately 11 mm (series 12, image 157-158 and series 13, image 105, also see sagittal MIPS). The right vertebral, basilar, and bilateral posterior cerebral arteries are unremarkable. Venous sinuses: Hyperdense prominent venous structure within the medial cerebellum, possibly a draining vein from the left cerebellar vascular malformation (series 12, image 22). Anatomic variants: None significant.  Delayed phase: No abnormal intracranial enhancement. Review of the MIP images confirms the above findings IMPRESSION: CTA head: 1. Small group of abnormal vessels within the posteromedial aspect of left cerebellar hemisphere spanning approximately 11 mm, suspected arteriovenous malformation. 2. Mild stenosis of left vertebral artery and PICA origin. 3. No additional aneurysm, stenosis, vascular malformation, or occlusion of the anterior and posterior circulation. CTA neck: Patent carotid and vertebral arteries of the neck. No dissection, aneurysm, or hemodynamically significant stenosis by NASCET criteria. These results will be called to the ordering clinician or representative by the Radiologist Assistant, and communication documented in the PACS or zVision Dashboard. Electronically Signed   By: Kristine Garbe M.D.   On: 02/15/2018 19:17   Mr Jeri Cos HF Contrast  Result Date: 02/17/2018 CLINICAL DATA:  Follow-up exam for intracranial hemorrhage. EXAM: MRI HEAD WITHOUT AND WITH CONTRAST TECHNIQUE: Multiplanar, multiecho pulse sequences of the brain and surrounding structures were obtained without and with intravenous contrast. CONTRAST:  90 cc of Gadavist. COMPARISON:  Comparison made with prior CTA from 02/15/2018 as well as previous CTs from 03/07/2018. FINDINGS: Brain: Intraparenchymal hemorrhage involving the cerebellar vermis and right cerebellar hemisphere again seen, relatively stable in size measuring approximately 3.6 x 2.9 x 3.2 cm. Localized edema with regional mass effect similar as well. Partial effacement of the fourth ventricle and fourth ventricular outflow tract anteriorly which remain patent. Ventricular size is stable without hydrocephalus or ventricular trapping. Slight asymmetry of the lateral ventricles with the left larger than the right noted, likely congenital. Mild mass effect on the brainstem anteriorly without frank compression. No transtentorial herniation. Probable  enhancing tangle of vessels at the adjacent posterior left cerebellum suspicious for AVM (series 18, image 16, also seen on prior CTA. No other abnormal enhancement seen underlying the hematoma. Right posterior fossa of subdural hematoma relatively stable measuring up to 4 mm in thickness. Additional small subdural collection overlying the posterior right cerebral convexity measures up to 2 mm without mass effect, likely small volume subdural hemorrhage and/or reactive hygroma. Scattered small volume subarachnoid hemorrhage present within the underlying right temporal occipital region (series 13, image 30). Smooth dural enhancement overlying the right cerebral convexity likely reactive. 9 mm acute ischemic left cerebellar infarct seen adjacent to the hematoma (series 5, image 58). No other evidence for acute ischemia. Underlying cerebral volume normal. Mild chronic microvascular ischemic changes noted within the periventricular white matter. No other areas of chronic infarction. No mass lesion or abnormal enhancement elsewhere within the brain. Vascular: Major intracranial vascular flow voids maintained left  cerebellar AVM measures approximately 13 x 18 mm (series 12, image 7). Skull and upper cervical spine: Craniocervical junction within normal limits. Upper cervical spine normal. Bone marrow signal intensity normal. No scalp soft tissue abnormality. Sinuses/Orbits: Globes and orbital soft tissues within normal limits. Mild scattered mucosal thickening throughout the ethmoidal air cells. Paranasal sinuses are otherwise clear. Trace right mastoid effusion noted, of doubtful significance. Inner ear structures normal. Other: None. IMPRESSION: 1. No significant interval change in size and appearance of evolving intraparenchymal hemorrhage involving the right cerebellum and cerebellar vermis. Similar regional mass effect without hydrocephalus or herniation. 2. Associated small right posterior fossa and posterior right  cerebral convexity subdural hematoma without significant mass effect, stable. Persistent small volume underlying subarachnoid hemorrhage. 3. 9 mm acute ischemic nonhemorrhagic left cerebellar infarct adjacent to the cerebellar hemorrhage. 4. Small left cerebellar AVM, presumably the source of hemorrhage. Electronically Signed   By: Jeannine Boga M.D.   On: 02/17/2018 03:39   Dg Chest Port 1 View  Result Date: 02/19/2018 CLINICAL DATA:  Intubation. Acute respiratory insufficiency. EXAM: PORTABLE CHEST 1 VIEW 12:58 p.m. COMPARISON:  Chest x-rays dated 02/19/2018 at 5:05 a.m. and 02/17/2018 FINDINGS: Endotracheal tube tip is at the level of the thoracic inlet 6.8 cm above the carina. Central venous catheter tip is in the superior vena cava at the level of the azygos vein. NG tube tip is in the fundus of the stomach. There is chronic elevation of the left hemidiaphragm. Heart size and vascularity are normal. No infiltrates or effusions. No acute bone abnormality. IMPRESSION: Endotracheal tube and central line appear in good position. No acute cardiopulmonary findings. Electronically Signed   By: Lorriane Shire M.D.   On: 02/19/2018 13:15   Dg Chest Portable 1 View  Result Date: 02/19/2018 CLINICAL DATA:  Aspiration, shortness of breath EXAM: PORTABLE CHEST 1 VIEW COMPARISON:  02/17/2018 FINDINGS: Left central line remains in place, unchanged. Mild cardiomegaly. Elevation of the left hemidiaphragm with left base atelectasis or infiltrate. No confluent opacity on the right. Mild vascular congestion. No acute bony abnormality. IMPRESSION: Left base atelectasis or infiltrate, similar to prior study. Cardiomegaly, vascular congestion. Electronically Signed   By: Rolm Baptise M.D.   On: 02/19/2018 07:37   Dg Chest Port 1 View  Result Date: 02/17/2018 CLINICAL DATA:  Respiratory failure EXAM: PORTABLE CHEST 1 VIEW COMPARISON:  1 day prior FINDINGS: Left internal jugular line tip at mid SVC. Cardiomegaly  accentuated by AP portable technique. Possible small left pleural effusion. No pneumothorax. Low lung volumes with resultant pulmonary interstitial prominence. Left lower lobe airspace disease is similar. Right perihilar airspace disease is slightly increased. IMPRESSION: Bilateral airspace disease and progressive, similar on the left on the right. Infection and/or alveolar pulmonary edema. Electronically Signed   By: Abigail Miyamoto M.D.   On: 02/17/2018 08:56   Dg Chest Port 1 View  Result Date: 02/16/2018 CLINICAL DATA:  Respiratory failure EXAM: PORTABLE CHEST 1 VIEW COMPARISON:  02/15/2018 FINDINGS: Left jugular central line is again identified and stable. Cardiac shadow is stable. Increasing left basilar infiltrate with likely small effusion is noted. No pneumothorax is seen. Mild right basilar atelectasis is seen. No bony abnormality is noted. IMPRESSION: Increasing left basilar infiltrate with small effusion. Increasing right basilar atelectasis. Electronically Signed   By: Inez Catalina M.D.   On: 02/16/2018 07:34   Dg Chest Port 1 View  Result Date: 02/15/2018 CLINICAL DATA:  Central line placement. EXAM: PORTABLE CHEST 1 VIEW COMPARISON:  Tool 07/2017  FINDINGS: Left internal jugular approach central venous catheter terminates in the expected location of the proximal superior vena cava. The patient has been extubated. Enlarged cardiac silhouette.  Left lower lobe airspace consolidation No evidence of pneumothorax. Elevation of the left hemidiaphragm with airspace consolidation versus atelectasis in the left lower lobe. Left pleural effusion is not excluded. Osseous structures are without acute abnormality. Soft tissues are grossly normal. IMPRESSION: Status post placement of left internal jugular approach central venous catheter with tip in the expected location of the proximal superior vena cava. No evidence of pneumothorax. Electronically Signed   By: Fidela Salisbury M.D.   On: 02/15/2018 17:06    Dg Chest Port 1 View  Result Date: 02/15/2018 CLINICAL DATA:  72 year old male status post intubation. EXAM: PORTABLE CHEST 1 VIEW COMPARISON:  Earlier radiograph dated 03/12/2018 FINDINGS: Endotracheal tube above the carina in similar position. Interval placement of an enteric tube which appears to extend below the diaphragm with tip in the left upper quadrant likely in the gastric fundus. There is shallow inspiration with bibasilar atelectasis. Mild eventration of the left hemidiaphragm. No focal consolidation, pleural effusion, or pneumothorax. Stable cardiac silhouette. No acute osseous pathology. IMPRESSION: Interval placement of an enteric tube with tip likely in the gastric fundus. No other interval change. Electronically Signed   By: Anner Crete M.D.   On: 02/15/2018 01:18   Dg Abd Portable 1v  Result Date: 02/19/2018 CLINICAL DATA:  OG tube placement. EXAM: PORTABLE ABDOMEN - 1 VIEW COMPARISON:  None FINDINGS: OG tube tip is in the fundus of the stomach. Bowel gas pattern is normal. Increased density in the right side of the abdomen may be due to an enlarged right lobe of the liver. Lumbar chronic elevation of the left hemidiaphragm. Scoliosis. IMPRESSION: OG tube tip in good position in the fundus of the stomach. Possible enlargement of the right lobe of the liver. Electronically Signed   By: Lorriane Shire M.D.   On: 02/19/2018 13:16   Dg Swallowing Func-speech Pathology  Result Date: 02/16/2018 Objective Swallowing Evaluation: Type of Study: MBS-Modified Barium Swallow Study  Patient Details Name: Marvin Mendez MRN: [id number] Date of Birth: 06/22/1945 Today's Date: 02/16/2018 Time: SLP Start Time (ACUTE ONLY): 1210 -SLP Stop Time (ACUTE ONLY): 1235 SLP Time Calculation (min) (ACUTE ONLY): 25 min Past Medical History: Past Medical History: Diagnosis Date . Anemia  . Hypertension  . Renal disorder  Past Surgical History: No past surgical history on file.  Subjective: Pt asleep, able to  rouse Assessment / Plan / Recommendation CHL IP CLINICAL IMPRESSIONS 02/16/2018 Clinical Impression Pt demonstrates a mild oral dysphagia and moderate pharyngeal dysphagia characterized by right labial and lingual weakness though lingual propulsion and mastication of liquids and soft soldis appear adequate with only mild residue and anterior spillage. Pharyngeal impairment includes intermittently decreased movement of the base of tongue and upper pharyngeal constrictors for bolus propulsion leading to vallecular residuals ranging from severe to mild. Larger bolus size does appear to trigger best propulsion. A head turn left was also significantly better at reducing vallecular residuals than any other posture. Pt senses residue and sometimes his ineffective efforts to clear appear like gagging. Recommend dys 2 (fine chip) and nectar thick liquids as thin liquids did result in silent aspiration before the swallow with larger boluses. Pts cough is not effective to clear. Will follow closely for tolerance.  SLP Visit Diagnosis Dysphagia, oropharyngeal phase (R13.12) Attention and concentration deficit following -- Frontal lobe and executive function deficit  following -- Impact on safety and function --   CHL IP TREATMENT RECOMMENDATION 02/16/2018 Treatment Recommendations Therapy as outlined in treatment plan below   Prognosis 02/16/2018 Prognosis for Safe Diet Advancement Good Barriers to Reach Goals -- Barriers/Prognosis Comment -- CHL IP DIET RECOMMENDATION 02/16/2018 SLP Diet Recommendations Dysphagia 2 (Fine chop) solids;Nectar thick liquid Liquid Administration via Straw Medication Administration Crushed with puree Compensations Slow rate;Small sips/bites;Minimize environmental distractions Postural Changes (No Data)   CHL IP OTHER RECOMMENDATIONS 02/16/2018 Recommended Consults -- Oral Care Recommendations Oral care BID Other Recommendations Have oral suction available   CHL IP FOLLOW UP RECOMMENDATIONS 02/16/2018  Follow up Recommendations Inpatient Rehab   CHL IP FREQUENCY AND DURATION 02/16/2018 Speech Therapy Frequency (ACUTE ONLY) min 2x/week Treatment Duration 2 weeks      CHL IP ORAL PHASE 02/16/2018 Oral Phase Impaired Oral - Pudding Teaspoon -- Oral - Pudding Cup -- Oral - Honey Teaspoon -- Oral - Honey Cup -- Oral - Nectar Teaspoon -- Oral - Nectar Cup -- Oral - Nectar Straw Right anterior bolus loss;Weak lingual manipulation;Decreased bolus cohesion Oral - Thin Teaspoon -- Oral - Thin Cup -- Oral - Thin Straw Decreased bolus cohesion;Weak lingual manipulation;Right anterior bolus loss Oral - Puree Decreased bolus cohesion;Reduced posterior propulsion;Weak lingual manipulation;Delayed oral transit Oral - Mech Soft Decreased bolus cohesion;Reduced posterior propulsion;Weak lingual manipulation;Delayed oral transit Oral - Regular -- Oral - Multi-Consistency -- Oral - Pill -- Oral Phase - Comment --  CHL IP PHARYNGEAL PHASE 02/16/2018 Pharyngeal Phase Impaired Pharyngeal- Pudding Teaspoon -- Pharyngeal -- Pharyngeal- Pudding Cup -- Pharyngeal -- Pharyngeal- Honey Teaspoon -- Pharyngeal -- Pharyngeal- Honey Cup NT Pharyngeal -- Pharyngeal- Nectar Teaspoon -- Pharyngeal -- Pharyngeal- Nectar Cup -- Pharyngeal -- Pharyngeal- Nectar Straw Delayed swallow initiation-pyriform sinuses;Delayed swallow initiation-vallecula;Reduced pharyngeal peristalsis;Reduced tongue base retraction;Penetration/Apiration after swallow;Trace aspiration;Pharyngeal residue - valleculae Pharyngeal Material enters airway, CONTACTS cords and not ejected out;Material does not enter airway Pharyngeal- Thin Teaspoon -- Pharyngeal -- Pharyngeal- Thin Cup -- Pharyngeal -- Pharyngeal- Thin Straw Delayed swallow initiation-pyriform sinuses;Delayed swallow initiation-vallecula;Reduced pharyngeal peristalsis;Reduced tongue base retraction;Trace aspiration;Pharyngeal residue - valleculae;Penetration/Aspiration before swallow Pharyngeal Material enters airway,  passes BELOW cords without attempt by patient to eject out (silent aspiration) Pharyngeal- Puree Delayed swallow initiation-pyriform sinuses;Delayed swallow initiation-vallecula;Reduced pharyngeal peristalsis;Reduced tongue base retraction;Pharyngeal residue - valleculae Pharyngeal Material does not enter airway Pharyngeal- Mechanical Soft Delayed swallow initiation-pyriform sinuses;Delayed swallow initiation-vallecula;Reduced pharyngeal peristalsis;Reduced tongue base retraction;Pharyngeal residue - valleculae Pharyngeal -- Pharyngeal- Regular -- Pharyngeal -- Pharyngeal- Multi-consistency -- Pharyngeal -- Pharyngeal- Pill -- Pharyngeal -- Pharyngeal Comment --  No flowsheet data found. Herbie Baltimore, MA CCC-SLP Acute Rehabilitation Services Pager [phone] Office [phone] Lynann Beaver 02/16/2018, 1:53 PM               TTE  Normal ejection fraction of 55-60% with no regional wall motion abnormalities. Cannot rule out echodense mass on septal leaflet of tricuspid valve   PHYSICAL EXAM  Temp:  [98.8 F (37.1 C)-99.8 F (37.7 C)] 99.1 F (37.3 C) (12/09 1201) Pulse Rate:  [85-110] 95 (12/09 1220) Resp:  [18-38] 18 (12/09 1220) BP: (96-183)/(65-137) 96/65 (12/09 1220) SpO2:  [90 %-100 %] 100 % (12/09 1220) FiO2 (%):  [50 %-100 %] 50 % (12/09 1337) Weight:  [91.2 kg] 91.2 kg (12/09 0211)  General - Well nourished, well developed elderly African-American male, in  respiratory distress with secretions .and wheezing  Ophthalmologic - fundi not visualized due to noncooperation.  Cardiovascular - Regular rate and rhythm.  Neuro - drowsy  but can be easily aroused,   and eyes open, able to follow all simple commands.  PERRL, b/l abduction not complete with improved nystagums b/l gaze and bidirectional.  Visual field full.  Right facial droop, tongue midline.  Facial sensation symmetrical.  Severe dysarthria. Moving all extremities symmetrically, sensation symmetrical.  Right arm  finger-to-nose mild ataxia.  Heel-to-shin not corporative.  Gait not tested.   ASSESSMENT/PLAN Marvin Mendez is a 72 y.o. male with history of hypertension, postural hypotension on Florinef, CKD, thrombocytopenia admitted for generalized weakness and lethargy started 5:30 PM 02/28/2018. No tPA given due to [place].    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 Ellene Route and he will follow up to repair AVM once pt stable  Resultant lethargy, nystagmus, right arm ataxia, respiratory distress  CT head cerebellar ICH with SDH  Repeat CT showed decreased right cerebellum and vermis ICH, stable right posterior fossa SDH MRI with and without contrast  No significant interval change in size and appearance of evolving intraparenchymal hemorrhage involving the right cerebellum and cerebellar vermis. Similar regional mass effect without.Associated small right posterior fossa and posterior right cerebral convexity subdural hematoma without significant mass effect, stable. Persistent small volume underlying subarachnoid hemorrhage. 9 mm acute ischemic nonhemorrhagic left cerebellar infarct adjacent to the cerebellar hemorrhage.Small left cerebellar AVM, presumably the source of hemorrhage.hydrocephalus or herniation.  CTA head and neck concerning left cerebellar AVM - Dr. Ellene Route will take care of it once pt stable  2D Echo  Left ventricular ejection fraction 55-60%. No wall motion abnormalities.Reubin Milan rule out echodense mass on right cuspid valve in apical views  LDL 76  HgbA1c 5.5  SCDs for VTE prophylaxis  NPO  No antithrombotic prior to admission, now on No antithrombotic.   Ongoing aggressive stroke risk factor management  Therapy recommendations:  Pending   Disposition:  Pending   Respiratory distress  Intubated on admission  Extubated 02/15/18  Pt still has mild respiratory distress with  difficulty handling secretions  On breathing treatment  Chest PT  CCM on board  Cerebral edema  Posterior fossa mass-effect  No hydrocephalus on CT so far  CT head and neck showed stable hematoma and no hydro  MRI brain 02/17/18 shows stable appearance of the cerebellar hemorrhage and mass effect. As well as small right posterior fossa and right cerebral convexity subdurals. 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 Small left cerebellar AVM  On 3% saline she was discontinued 05/18/17  Sodium 160-will wait for it to slowly come down and not use hypotonic fluids due to concerns about worsening cytotoxic edema   Sodium check every daily  Has central line placement  Hypertension . Stable  BP goal < 140  On low dose cleviprex   Consider po BP meds once pass swallow  Hyperlipidemia  Home meds:  none   LDL 76, goal < 70  No statin at this time  Dysphagia  Did not pass swallow  MBS today  If not passing swallow, may consider cortrak    CKD  Creatinine 1.5->1.5->1.34  On IV fluid  BMP monitoring  Other Stroke Risk Factors  Advanced age  Other Active Problems  Hyperglycemia, improved  Postural hypotension on Florinef at home  Thrombocytopenia platelet 147-> 138  Hypernatremia induced to control cytotoxic edema  Hospital day # 4 He has  developed worsening infiltrate on chest x-ray  as well as respiratory distress and likely will need emergent intubation and antibiotics. D/w  Dr. Vaughan Browner critical care M.D. .Continue systolic blood pressure goal below 160   Use when necessary labetalol and hydralazine.   I'll let sodium gradually normalize. Do not use D5W or hypotonic fluids due to fear of worsening cerebral edema.. Discussed with Dr. Ellene Route This patient is critically ill due to cerebellar ICH, posterior fossa SDH, cerebral edema, respiratory distress hypertensive emergency and at significant risk of neurological worsening, death form  hematoma expansion, cerebral edema, brain herniation, seizure, respiratory failure. This patient's care requires constant monitoring of vital signs, hemodynamics, respiratory and cardiac monitoring, review of multiple databases, neurological assessment, discussion with family, other specialists and medical decision making of high complexity. I spent 40 minutes of neurocritical care time in the care of this patient. I had long discussion with niece and nephrew in law at bedside, updated pt current condition, treatment plan and potential prognosis. They expressed understanding and appreciation.   Antony Contras, MD Stroke Neurology 02/19/2018 3:03 PM    To contact Stroke Continuity provider, please refer to [email or web address] After hours, contact General Neurology

## 2018-02-20 ENCOUNTER — Inpatient Hospital Stay (HOSPITAL_COMMUNITY): Payer: PPO

## 2018-02-20 LAB — CBC
HCT: 38.4 % — ABNORMAL LOW (ref 39.0–52.0)
HEMOGLOBIN: 11.7 g/dL — AB (ref 13.0–17.0)
MCH: 29.8 pg (ref 26.0–34.0)
MCHC: 30.5 g/dL (ref 30.0–36.0)
MCV: 97.7 fL (ref 80.0–100.0)
NRBC: 0.3 % — AB (ref 0.0–0.2)
Platelets: 76 10*3/uL — ABNORMAL LOW (ref 150–400)
RBC: 3.93 MIL/uL — ABNORMAL LOW (ref 4.22–5.81)
RDW: 13.9 % (ref 11.5–15.5)
WBC: 9.6 10*3/uL (ref 4.0–10.5)

## 2018-02-20 LAB — URINALYSIS, ROUTINE W REFLEX MICROSCOPIC
Bilirubin Urine: NEGATIVE
Glucose, UA: NEGATIVE mg/dL
Ketones, ur: 5 mg/dL — AB
Leukocytes, UA: NEGATIVE
Nitrite: NEGATIVE
Protein, ur: 30 mg/dL — AB
Specific Gravity, Urine: 1.02 (ref 1.005–1.030)
pH: 5 (ref 5.0–8.0)

## 2018-02-20 LAB — GLUCOSE, CAPILLARY
Glucose-Capillary: 110 mg/dL — ABNORMAL HIGH (ref 70–99)
Glucose-Capillary: 103 mg/dL — ABNORMAL HIGH (ref 70–99)
Glucose-Capillary: 95 mg/dL (ref 70–99)
Glucose-Capillary: 88 mg/dL (ref 70–99)
Glucose-Capillary: 111 mg/dL — ABNORMAL HIGH (ref 70–99)

## 2018-02-20 LAB — BASIC METABOLIC PANEL
BUN: 37 mg/dL — ABNORMAL HIGH (ref 8–23)
CO2: 20 mmol/L — ABNORMAL LOW (ref 22–32)
Calcium: 8.8 mg/dL — ABNORMAL LOW (ref 8.9–10.3)
Chloride: >130 mmol/L (ref 98–111)
Creatinine, Ser: 2.21 mg/dL — ABNORMAL HIGH (ref 0.61–1.24)
GFR calc Af Amer: 33 mL/min — ABNORMAL LOW (ref >60)
GFR calc non Af Amer: 28 mL/min — ABNORMAL LOW (ref >60)
Glucose, Bld: 122 mg/dL — ABNORMAL HIGH (ref 70–99)
Potassium: 3.7 mmol/L (ref 3.5–5.1)
Sodium: 161 mmol/L (ref 135–145)

## 2018-02-20 LAB — MAGNESIUM: MAGNESIUM: 2 mg/dL (ref 1.7–2.4)

## 2018-02-20 LAB — PHOSPHORUS: Phosphorus: 2 mg/dL — ABNORMAL LOW (ref 2.5–4.6)

## 2018-02-20 LAB — PROCALCITONIN: Procalcitonin: 0.25 ng/mL

## 2018-02-20 MED ORDER — SODIUM CHLORIDE 0.9 % IV SOLN
1.0000 g | INTRAVENOUS | Status: DC
  Administered 2018-02-21 – 2018-02-22 (×2): 1 g via INTRAVENOUS
  Filled 2018-02-20 (×2): qty 1

## 2018-02-20 MED ORDER — FREE WATER
200.0000 mL | Freq: Four times a day (QID) | Status: DC
  Administered 2018-02-20 – 2018-02-28 (×34): 200 mL

## 2018-02-20 MED ORDER — VITAL AF 1.2 CAL PO LIQD
1000.0000 mL | ORAL | Status: DC
  Administered 2018-02-20 – 2018-02-27 (×9): 1000 mL
  Filled 2018-02-20: qty 1000

## 2018-02-20 NOTE — Progress Notes (Signed)
OT Cancellation Note  Patient Details Name: DARYUS SOWASH MRN: [id number] DOB: [birth year]   Cancelled Treatment:    Reason Eval/Treat Not Completed: Medical issues which prohibited therapy.  Pt intubated and sedated on 3 pt restraints.  Will continue to follow and initiate OT eval when medically appropriate and able.    Delight Stare, OT Acute Rehabilitation Services Pager [phone] Office [phone]    Delight Stare 02/20/2018, 11:07 AM

## 2018-02-20 NOTE — Progress Notes (Signed)
STROKE TEAM PROGRESS NOTE   SUBJECTIVE (INTERVAL HISTORY) His RN is at the bedside.    He remains intubated and is restless and agitated despite being on sedation.  Serum sodium is yet high at 1601and hypertonic saline has been discontinued 2 days ago . Blood pressure has been adequately controlled blood cultures are pending. He has been started on broad-spectrum antibiotics  OBJECTIVE Temp:  [97.9 F (36.6 C)-99.5 F (37.5 C)] 98.6 F (37 C) (12/10 1200) Pulse Rate:  [69-105] 72 (12/10 1400) Cardiac Rhythm: Normal sinus rhythm (12/10 0400) Resp:  [14-29] 19 (12/10 1400) BP: (101-149)/(69-108) 105/70 (12/10 1400) SpO2:  [97 %-100 %] 100 % (12/10 1400) FiO2 (%):  [40 %-50 %] 40 % (12/10 1104) Weight:  [92.5 kg] 92.5 kg (12/10 0500)  Recent Labs  Lab 02/19/18 2004 02/19/18 2347 02/20/18 0401 02/20/18 0815 02/20/18 1210  GLUCAP 103* 111* 110* 103* 95   Recent Labs  Lab 02/15/18 0326  02/16/18 0408  02/17/18 0523  02/18/18 0250 02/18/18 0800 02/19/18 0525 02/19/18 1009 02/20/18 0500  NA 137   < > 148*   < > 156*   < > 159* 159* 160* 159* 161*  K 4.5  --  3.9  --  4.2  --   --   --   --  3.2* 3.7  CL 108  --  121*  --  128*  --   --   --   --  129* >130*  CO2 16*  --  20*  --  20*  --   --   --   --  22 20*  GLUCOSE 182*  --  121*  --  131*  --   --   --   --  128* 122*  BUN 21  --  14  --  14  --   --   --   --  23 37*  CREATININE 1.50*  --  1.34*  --  1.62*  --   --   --   --  1.61* 2.21*  CALCIUM 8.8*  --  8.8*  --  8.8*  --   --   --   --  8.7* 8.8*  MG 2.0  --   --   --  2.0  --   --   --   --   --  2.0  PHOS 2.6  --  1.5*  --  2.1*  --   --   --   --   --  2.0*   < > = values in this interval not displayed.   Recent Labs  Lab 02/19/18 1009  AST 48*  ALT 27  ALKPHOS 52  BILITOT 1.3*  PROT 6.9  ALBUMIN 2.8*   Recent Labs  Lab 02/16/2018 2349 03/06/2018 2355 02/15/18 0326 02/17/18 0523 02/19/18 1009 02/20/18 0500  WBC 9.5  --  8.4 12.0* 8.7 9.6  NEUTROABS  7.3  --   --   --  6.8  --   HGB 12.4* 13.9 12.1* 11.4* 12.0* 11.7*  HCT 40.3 41.0 36.9* 36.0* 37.8* 38.4*  MCV 96.9  --  94.9 97.8 95.2 97.7  PLT 147*  --  138* 95* 84* 76*   No results for input(s): CKTOTAL, CKMB, CKMBINDEX, TROPONINI in the last 168 hours. No results for input(s): LABPROT, INR in the last 72 hours. Recent Labs    02/20/18 1103  COLORURINE YELLOW  LABSPEC 1.020  PHURINE 5.0  GLUCOSEU NEGATIVE  HGBUR MODERATE*  BILIRUBINUR NEGATIVE  KETONESUR 5*  PROTEINUR 30*  NITRITE NEGATIVE  LEUKOCYTESUR NEGATIVE       Component Value Date/Time   CHOL 134 02/16/2018 0408   TRIG 74 02/16/2018 0408   HDL 43 02/16/2018 0408   CHOLHDL 3.1 02/16/2018 0408   VLDL 15 02/16/2018 0408   LDLCALC 76 02/16/2018 0408   Lab Results  Component Value Date   HGBA1C 5.5 02/24/2018   No results found for: LABOPIA, COCAINSCRNUR, LABBENZ, AMPHETMU, THCU, LABBARB  No results for input(s): ETH in the last 168 hours.  I have personally reviewed the radiological images below and agree with the radiology interpretations.  Ct Angio Head W Or Wo Contrast  Result Date: 02/15/2018 CLINICAL DATA:  72 y/o  M; follow-up of intracranial hemorrhage. EXAM: CT ANGIOGRAPHY HEAD AND NECK TECHNIQUE: Multidetector CT imaging of the head and neck was performed using the standard protocol during bolus administration of intravenous contrast. Multiplanar CT image reconstructions and MIPs were obtained to evaluate the vascular anatomy. Carotid stenosis measurements (when applicable) are obtained utilizing NASCET criteria, using the distal internal carotid diameter as the denominator. CONTRAST:  50mL ISOVUE-370 IOPAMIDOL (ISOVUE-370) INJECTION 76% COMPARISON:  02/20/2018 CT head. FINDINGS: CTA NECK FINDINGS Aortic arch: Standard branching. Imaged portion shows no evidence of aneurysm or dissection. No significant stenosis of the major arch vessel origins. Right carotid system: No evidence of dissection, stenosis  (50% or greater) or occlusion. Left carotid system: No evidence of dissection, stenosis (50% or greater) or occlusion. Vertebral arteries: Codominant. No evidence of dissection, stenosis (50% or greater) or occlusion. Skeleton: Mild-to-moderate cervical spondylosis with multilevel disc and facet degenerative changes. No high-grade bony canal stenosis. Other neck: Negative. Upper chest: Negative. Review of the MIP images confirms the above findings CTA HEAD FINDINGS Anterior circulation: No significant stenosis, proximal occlusion, aneurysm, or vascular malformation. Posterior circulation: Mild stenosis of the left vertebral artery at the left PICA origin. The left PICA asymmetrically enlarged with a branch extending to a cluster of vessels within the posteromedial aspect of the left cerebellar hemisphere spanning approximately 11 mm (series 12, image [id number] and series 13, image 105, also see sagittal MIPS). The right vertebral, basilar, and bilateral posterior cerebral arteries are unremarkable. Venous sinuses: Hyperdense prominent venous structure within the medial cerebellum, possibly a draining vein from the left cerebellar vascular malformation (series 12, image 22). Anatomic variants: None significant. Delayed phase: No abnormal intracranial enhancement. Review of the MIP images confirms the above findings IMPRESSION: CTA head: 1. Small group of abnormal vessels within the posteromedial aspect of left cerebellar hemisphere spanning approximately 11 mm, suspected arteriovenous malformation. 2. Mild stenosis of left vertebral artery and PICA origin. 3. No additional aneurysm, stenosis, vascular malformation, or occlusion of the anterior and posterior circulation. CTA neck: Patent carotid and vertebral arteries of the neck. No dissection, aneurysm, or hemodynamically significant stenosis by NASCET criteria. These results will be called to the ordering clinician or representative by the Radiologist Assistant, and  communication documented in the PACS or zVision Dashboard. Electronically Signed   By: Kristine Garbe M.D.   On: 02/15/2018 19:17   Ct Head Wo Contrast  Result Date: 02/18/2018 CLINICAL DATA:  Follow-up examination for intracranial hemorrhage. EXAM: CT HEAD WITHOUT CONTRAST TECHNIQUE: Contiguous axial images were obtained from the base of the skull through the vertex without intravenous contrast. COMPARISON:  Prior CT from 03/03/2018. FINDINGS: Brain: Involving central cerebellar hemorrhage decreased in size now measuring 2.5 x 2.7 x 2.5 cm (8.4 cc), previously 2.8  x 3.3 x 3.3 cm (16 cc). Subdural extension with small right parafalcine and tentorial hematoma again noted, similar. Subdural hematoma at the right posterior fossa little interval changed measuring up to 4-5 mm. Persistent localized mass effect with effacement of the suprasellar cistern. Mass effect on the adjacent fourth ventricle which remains partially effaced, relatively similar. No hydrocephalus. Trace subarachnoid hemorrhage within the right temporal occipital region persist. No other new acute intracranial abnormality. No acute large vessel territory infarct. Vascular: No hyperdense vessel. Skull: Scalp soft tissues and calvarium demonstrate no acute finding. Sinuses/Orbits: Globes and orbital soft tissues within normal limits. Scattered mucosal thickening throughout the ethmoidal air cells. Paranasal sinuses are otherwise grossly clear. No mastoid effusion. Other: None. IMPRESSION: 1. Interval decrease in size of evolving acute central cerebellar hematoma (8 cc, previously 16 cc). Persistent regional mass effect without obstructive hydrocephalus. 2. Persistent small subdural hematoma overlying the right posterior fossa and along the falx and tentorium without mass effect, similar to previous. Persistent trace subarachnoid hemorrhage. Electronically Signed   By: Jeannine Boga M.D.   On: 02/18/2018 03:44   Ct Head Wo  Contrast  Result Date: 02/15/2018 CLINICAL DATA:  Altered mental status. Follow-up intracranial hemorrhage. EXAM: CT HEAD WITHOUT CONTRAST TECHNIQUE: Contiguous axial images were obtained from the base of the skull through the vertex without intravenous contrast. COMPARISON:  CT HEAD February 14, 2018 at 1815 hours FINDINGS: BRAIN: Central cerebellar 2.8 x 3.3 x 3.3 cm (volume = 16 cc) intraparenchymal hematoma was 30 cc. Subdural extension with decreased 2-3 mm falcotentorial subdural hematoma. RIGHT posterior fossa subdural hematoma measuring to 4 mm tracking into the included cervical spine. Effaced supra cerebellar cistern. Regional mass effect resulting in narrowed fourth ventricle without hydrocephalus. Trace subarachnoid hemorrhage. No acute large vascular territory infarct. VASCULAR: Trace calcific atherosclerosis of the carotid siphons. SKULL: No skull fracture. No significant scalp soft tissue swelling. SINUSES/ORBITS: Mild paranasal sinus mucosal thickening. Mastoid air cells are well aerated.The included ocular globes and orbital contents are non-suspicious. OTHER: None. IMPRESSION: 1. Evolving acute central cerebellar hematoma (16 cc, previously 30 cc). Regional mass effect without obstructive hydrocephalus. 2. Decreased falcotentorial and RIGHT posterior fossa subdural hematomas extending into included cervical spine. Small volume subarachnoid hemorrhage. Electronically Signed   By: Elon Alas M.D.   On: 02/15/2018 01:16   Ct Angio Neck W Or Wo Contrast  Result Date: 02/15/2018 CLINICAL DATA:  72 y/o  M; follow-up of intracranial hemorrhage. EXAM: CT ANGIOGRAPHY HEAD AND NECK TECHNIQUE: Multidetector CT imaging of the head and neck was performed using the standard protocol during bolus administration of intravenous contrast. Multiplanar CT image reconstructions and MIPs were obtained to evaluate the vascular anatomy. Carotid stenosis measurements (when applicable) are obtained utilizing  NASCET criteria, using the distal internal carotid diameter as the denominator. CONTRAST:  71mL ISOVUE-370 IOPAMIDOL (ISOVUE-370) INJECTION 76% COMPARISON:  02/21/2018 CT head. FINDINGS: CTA NECK FINDINGS Aortic arch: Standard branching. Imaged portion shows no evidence of aneurysm or dissection. No significant stenosis of the major arch vessel origins. Right carotid system: No evidence of dissection, stenosis (50% or greater) or occlusion. Left carotid system: No evidence of dissection, stenosis (50% or greater) or occlusion. Vertebral arteries: Codominant. No evidence of dissection, stenosis (50% or greater) or occlusion. Skeleton: Mild-to-moderate cervical spondylosis with multilevel disc and facet degenerative changes. No high-grade bony canal stenosis. Other neck: Negative. Upper chest: Negative. Review of the MIP images confirms the above findings CTA HEAD FINDINGS Anterior circulation: No significant stenosis, proximal occlusion, aneurysm,  or vascular malformation. Posterior circulation: Mild stenosis of the left vertebral artery at the left PICA origin. The left PICA asymmetrically enlarged with a branch extending to a cluster of vessels within the posteromedial aspect of the left cerebellar hemisphere spanning approximately 11 mm (series 12, image 157-158 and series 13, image 105, also see sagittal MIPS). The right vertebral, basilar, and bilateral posterior cerebral arteries are unremarkable. Venous sinuses: Hyperdense prominent venous structure within the medial cerebellum, possibly a draining vein from the left cerebellar vascular malformation (series 12, image 22). Anatomic variants: None significant. Delayed phase: No abnormal intracranial enhancement. Review of the MIP images confirms the above findings IMPRESSION: CTA head: 1. Small group of abnormal vessels within the posteromedial aspect of left cerebellar hemisphere spanning approximately 11 mm, suspected arteriovenous malformation. 2. Mild  stenosis of left vertebral artery and PICA origin. 3. No additional aneurysm, stenosis, vascular malformation, or occlusion of the anterior and posterior circulation. CTA neck: Patent carotid and vertebral arteries of the neck. No dissection, aneurysm, or hemodynamically significant stenosis by NASCET criteria. These results will be called to the ordering clinician or representative by the Radiologist Assistant, and communication documented in the PACS or zVision Dashboard. Electronically Signed   By: Kristine Garbe M.D.   On: 02/15/2018 19:17   Mr Jeri Cos UU Contrast  Result Date: 02/17/2018 CLINICAL DATA:  Follow-up exam for intracranial hemorrhage. EXAM: MRI HEAD WITHOUT AND WITH CONTRAST TECHNIQUE: Multiplanar, multiecho pulse sequences of the brain and surrounding structures were obtained without and with intravenous contrast. CONTRAST:  90 cc of Gadavist. COMPARISON:  Comparison made with prior CTA from 02/15/2018 as well as previous CTs from 02/16/2018. FINDINGS: Brain: Intraparenchymal hemorrhage involving the cerebellar vermis and right cerebellar hemisphere again seen, relatively stable in size measuring approximately 3.6 x 2.9 x 3.2 cm. Localized edema with regional mass effect similar as well. Partial effacement of the fourth ventricle and fourth ventricular outflow tract anteriorly which remain patent. Ventricular size is stable without hydrocephalus or ventricular trapping. Slight asymmetry of the lateral ventricles with the left larger than the right noted, likely congenital. Mild mass effect on the brainstem anteriorly without frank compression. No transtentorial herniation. Probable enhancing tangle of vessels at the adjacent posterior left cerebellum suspicious for AVM (series 18, image 16, also seen on prior CTA. No other abnormal enhancement seen underlying the hematoma. Right posterior fossa of subdural hematoma relatively stable measuring up to 4 mm in thickness. Additional small  subdural collection overlying the posterior right cerebral convexity measures up to 2 mm without mass effect, likely small volume subdural hemorrhage and/or reactive hygroma. Scattered small volume subarachnoid hemorrhage present within the underlying right temporal occipital region (series 13, image 30). Smooth dural enhancement overlying the right cerebral convexity likely reactive. 9 mm acute ischemic left cerebellar infarct seen adjacent to the hematoma (series 5, image 58). No other evidence for acute ischemia. Underlying cerebral volume normal. Mild chronic microvascular ischemic changes noted within the periventricular white matter. No other areas of chronic infarction. No mass lesion or abnormal enhancement elsewhere within the brain. Vascular: Major intracranial vascular flow voids maintained left cerebellar AVM measures approximately 13 x 18 mm (series 12, image 7). Skull and upper cervical spine: Craniocervical junction within normal limits. Upper cervical spine normal. Bone marrow signal intensity normal. No scalp soft tissue abnormality. Sinuses/Orbits: Globes and orbital soft tissues within normal limits. Mild scattered mucosal thickening throughout the ethmoidal air cells. Paranasal sinuses are otherwise clear. Trace right mastoid effusion noted, of doubtful  significance. Inner ear structures normal. Other: None. IMPRESSION: 1. No significant interval change in size and appearance of evolving intraparenchymal hemorrhage involving the right cerebellum and cerebellar vermis. Similar regional mass effect without hydrocephalus or herniation. 2. Associated small right posterior fossa and posterior right cerebral convexity subdural hematoma without significant mass effect, stable. Persistent small volume underlying subarachnoid hemorrhage. 3. 9 mm acute ischemic nonhemorrhagic left cerebellar infarct adjacent to the cerebellar hemorrhage. 4. Small left cerebellar AVM, presumably the source of hemorrhage.  Electronically Signed   By: Jeannine Boga M.D.   On: 02/17/2018 03:39   Dg Chest Port 1 View  Result Date: 02/20/2018 CLINICAL DATA:  Acute respiratory failure, intubation EXAM: PORTABLE CHEST 1 VIEW COMPARISON:  Portable exam 0555 hours compared to 02/19/2018 FINDINGS: Tip of endotracheal tube projects 4.0 cm above carina. Nasogastric tube coiled in proximal stomach. LEFT jugular central venous catheter with tip projecting over SVC. Normal heart size and mediastinal contours. Elevation of LEFT diaphragm with bibasilar atelectasis. Peribronchial thickening. No acute infiltrate, pleural effusion or pneumothorax. IMPRESSION: Bibasilar atelectasis. Electronically Signed   By: Lavonia Dana M.D.   On: 02/20/2018 09:03   Dg Chest Port 1 View  Result Date: 02/19/2018 CLINICAL DATA:  Intubation. Acute respiratory insufficiency. EXAM: PORTABLE CHEST 1 VIEW 12:58 p.m. COMPARISON:  Chest x-rays dated 02/19/2018 at 5:05 a.m. and 02/17/2018 FINDINGS: Endotracheal tube tip is at the level of the thoracic inlet 6.8 cm above the carina. Central venous catheter tip is in the superior vena cava at the level of the azygos vein. NG tube tip is in the fundus of the stomach. There is chronic elevation of the left hemidiaphragm. Heart size and vascularity are normal. No infiltrates or effusions. No acute bone abnormality. IMPRESSION: Endotracheal tube and central line appear in good position. No acute cardiopulmonary findings. Electronically Signed   By: Lorriane Shire M.D.   On: 02/19/2018 13:15   Dg Chest Portable 1 View  Result Date: 02/19/2018 CLINICAL DATA:  Aspiration, shortness of breath EXAM: PORTABLE CHEST 1 VIEW COMPARISON:  02/17/2018 FINDINGS: Left central line remains in place, unchanged. Mild cardiomegaly. Elevation of the left hemidiaphragm with left base atelectasis or infiltrate. No confluent opacity on the right. Mild vascular congestion. No acute bony abnormality. IMPRESSION: Left base atelectasis or  infiltrate, similar to prior study. Cardiomegaly, vascular congestion. Electronically Signed   By: Rolm Baptise M.D.   On: 02/19/2018 07:37   Dg Chest Port 1 View  Result Date: 02/17/2018 CLINICAL DATA:  Respiratory failure EXAM: PORTABLE CHEST 1 VIEW COMPARISON:  1 day prior FINDINGS: Left internal jugular line tip at mid SVC. Cardiomegaly accentuated by AP portable technique. Possible small left pleural effusion. No pneumothorax. Low lung volumes with resultant pulmonary interstitial prominence. Left lower lobe airspace disease is similar. Right perihilar airspace disease is slightly increased. IMPRESSION: Bilateral airspace disease and progressive, similar on the left on the right. Infection and/or alveolar pulmonary edema. Electronically Signed   By: Abigail Miyamoto M.D.   On: 02/17/2018 08:56   Dg Chest Port 1 View  Result Date: 02/16/2018 CLINICAL DATA:  Respiratory failure EXAM: PORTABLE CHEST 1 VIEW COMPARISON:  02/15/2018 FINDINGS: Left jugular central line is again identified and stable. Cardiac shadow is stable. Increasing left basilar infiltrate with likely small effusion is noted. No pneumothorax is seen. Mild right basilar atelectasis is seen. No bony abnormality is noted. IMPRESSION: Increasing left basilar infiltrate with small effusion. Increasing right basilar atelectasis. Electronically Signed   By: Linus Mako.D.  On: 02/16/2018 07:34   Dg Chest Port 1 View  Result Date: 02/15/2018 CLINICAL DATA:  Central line placement. EXAM: PORTABLE CHEST 1 VIEW COMPARISON:  Tool 07/2017 FINDINGS: Left internal jugular approach central venous catheter terminates in the expected location of the proximal superior vena cava. The patient has been extubated. Enlarged cardiac silhouette.  Left lower lobe airspace consolidation No evidence of pneumothorax. Elevation of the left hemidiaphragm with airspace consolidation versus atelectasis in the left lower lobe. Left pleural effusion is not excluded.  Osseous structures are without acute abnormality. Soft tissues are grossly normal. IMPRESSION: Status post placement of left internal jugular approach central venous catheter with tip in the expected location of the proximal superior vena cava. No evidence of pneumothorax. Electronically Signed   By: Fidela Salisbury M.D.   On: 02/15/2018 17:06   Dg Chest Port 1 View  Result Date: 02/15/2018 CLINICAL DATA:  72 year old male status post intubation. EXAM: PORTABLE CHEST 1 VIEW COMPARISON:  Earlier radiograph dated 03/10/2018 FINDINGS: Endotracheal tube above the carina in similar position. Interval placement of an enteric tube which appears to extend below the diaphragm with tip in the left upper quadrant likely in the gastric fundus. There is shallow inspiration with bibasilar atelectasis. Mild eventration of the left hemidiaphragm. No focal consolidation, pleural effusion, or pneumothorax. Stable cardiac silhouette. No acute osseous pathology. IMPRESSION: Interval placement of an enteric tube with tip likely in the gastric fundus. No other interval change. Electronically Signed   By: Anner Crete M.D.   On: 02/15/2018 01:18   Dg Abd Portable 1v  Result Date: 02/19/2018 CLINICAL DATA:  OG tube placement. EXAM: PORTABLE ABDOMEN - 1 VIEW COMPARISON:  None FINDINGS: OG tube tip is in the fundus of the stomach. Bowel gas pattern is normal. Increased density in the right side of the abdomen may be due to an enlarged right lobe of the liver. Lumbar chronic elevation of the left hemidiaphragm. Scoliosis. IMPRESSION: OG tube tip in good position in the fundus of the stomach. Possible enlargement of the right lobe of the liver. Electronically Signed   By: Lorriane Shire M.D.   On: 02/19/2018 13:16   Dg Swallowing Func-speech Pathology  Result Date: 02/16/2018 Objective Swallowing Evaluation: Type of Study: MBS-Modified Barium Swallow Study  Patient Details Name: ALBA KRIESEL MRN: [id number] Date of Birth:  [birth year] Today's Date: 02/16/2018 Time: SLP Start Time (ACUTE ONLY): 1210 -SLP Stop Time (ACUTE ONLY): 1235 SLP Time Calculation (min) (ACUTE ONLY): 25 min Past Medical History: Past Medical History: Diagnosis Date . Anemia  . Hypertension  . Renal disorder  Past Surgical History: No past surgical history on file.  Subjective: Pt asleep, able to rouse Assessment / Plan / Recommendation CHL IP CLINICAL IMPRESSIONS 02/16/2018 Clinical Impression Pt demonstrates a mild oral dysphagia and moderate pharyngeal dysphagia characterized by right labial and lingual weakness though lingual propulsion and mastication of liquids and soft soldis appear adequate with only mild residue and anterior spillage. Pharyngeal impairment includes intermittently decreased movement of the base of tongue and upper pharyngeal constrictors for bolus propulsion leading to vallecular residuals ranging from severe to mild. Larger bolus size does appear to trigger best propulsion. A head turn left was also significantly better at reducing vallecular residuals than any other posture. Pt senses residue and sometimes his ineffective efforts to clear appear like gagging. Recommend dys 2 (fine chip) and nectar thick liquids as thin liquids did result in silent aspiration before the swallow with larger boluses. Pts cough is  not effective to clear. Will follow closely for tolerance.  SLP Visit Diagnosis Dysphagia, oropharyngeal phase (R13.12) Attention and concentration deficit following -- Frontal lobe and executive function deficit following -- Impact on safety and function --   CHL IP TREATMENT RECOMMENDATION 02/16/2018 Treatment Recommendations Therapy as outlined in treatment plan below   Prognosis 02/16/2018 Prognosis for Safe Diet Advancement Good Barriers to Reach Goals -- Barriers/Prognosis Comment -- CHL IP DIET RECOMMENDATION 02/16/2018 SLP Diet Recommendations Dysphagia 2 (Fine chop) solids;Nectar thick liquid Liquid Administration via Straw  Medication Administration Crushed with puree Compensations Slow rate;Small sips/bites;Minimize environmental distractions Postural Changes (No Data)   CHL IP OTHER RECOMMENDATIONS 02/16/2018 Recommended Consults -- Oral Care Recommendations Oral care BID Other Recommendations Have oral suction available   CHL IP FOLLOW UP RECOMMENDATIONS 02/16/2018 Follow up Recommendations Inpatient Rehab   CHL IP FREQUENCY AND DURATION 02/16/2018 Speech Therapy Frequency (ACUTE ONLY) min 2x/week Treatment Duration 2 weeks      CHL IP ORAL PHASE 02/16/2018 Oral Phase Impaired Oral - Pudding Teaspoon -- Oral - Pudding Cup -- Oral - Honey Teaspoon -- Oral - Honey Cup -- Oral - Nectar Teaspoon -- Oral - Nectar Cup -- Oral - Nectar Straw Right anterior bolus loss;Weak lingual manipulation;Decreased bolus cohesion Oral - Thin Teaspoon -- Oral - Thin Cup -- Oral - Thin Straw Decreased bolus cohesion;Weak lingual manipulation;Right anterior bolus loss Oral - Puree Decreased bolus cohesion;Reduced posterior propulsion;Weak lingual manipulation;Delayed oral transit Oral - Mech Soft Decreased bolus cohesion;Reduced posterior propulsion;Weak lingual manipulation;Delayed oral transit Oral - Regular -- Oral - Multi-Consistency -- Oral - Pill -- Oral Phase - Comment --  CHL IP PHARYNGEAL PHASE 02/16/2018 Pharyngeal Phase Impaired Pharyngeal- Pudding Teaspoon -- Pharyngeal -- Pharyngeal- Pudding Cup -- Pharyngeal -- Pharyngeal- Honey Teaspoon -- Pharyngeal -- Pharyngeal- Honey Cup NT Pharyngeal -- Pharyngeal- Nectar Teaspoon -- Pharyngeal -- Pharyngeal- Nectar Cup -- Pharyngeal -- Pharyngeal- Nectar Straw Delayed swallow initiation-pyriform sinuses;Delayed swallow initiation-vallecula;Reduced pharyngeal peristalsis;Reduced tongue base retraction;Penetration/Apiration after swallow;Trace aspiration;Pharyngeal residue - valleculae Pharyngeal Material enters airway, CONTACTS cords and not ejected out;Material does not enter airway Pharyngeal- Thin  Teaspoon -- Pharyngeal -- Pharyngeal- Thin Cup -- Pharyngeal -- Pharyngeal- Thin Straw Delayed swallow initiation-pyriform sinuses;Delayed swallow initiation-vallecula;Reduced pharyngeal peristalsis;Reduced tongue base retraction;Trace aspiration;Pharyngeal residue - valleculae;Penetration/Aspiration before swallow Pharyngeal Material enters airway, passes BELOW cords without attempt by patient to eject out (silent aspiration) Pharyngeal- Puree Delayed swallow initiation-pyriform sinuses;Delayed swallow initiation-vallecula;Reduced pharyngeal peristalsis;Reduced tongue base retraction;Pharyngeal residue - valleculae Pharyngeal Material does not enter airway Pharyngeal- Mechanical Soft Delayed swallow initiation-pyriform sinuses;Delayed swallow initiation-vallecula;Reduced pharyngeal peristalsis;Reduced tongue base retraction;Pharyngeal residue - valleculae Pharyngeal -- Pharyngeal- Regular -- Pharyngeal -- Pharyngeal- Multi-consistency -- Pharyngeal -- Pharyngeal- Pill -- Pharyngeal -- Pharyngeal Comment --  No flowsheet data found. Herbie Baltimore, MA CCC-SLP Acute Rehabilitation Services Pager [phone] Office [phone] Lynann Beaver 02/16/2018, 1:53 PM               TTE  Normal ejection fraction of 55-60% with no regional wall motion abnormalities. Cannot rule out echodense mass on septal leaflet of tricuspid valve   PHYSICAL EXAM  Temp:  [97.9 F (36.6 C)-99.5 F (37.5 C)] 98.6 F (37 C) (12/10 1200) Pulse Rate:  [69-105] 72 (12/10 1400) Resp:  [14-29] 19 (12/10 1400) BP: (101-149)/(69-108) 105/70 (12/10 1400) SpO2:  [97 %-100 %] 100 % (12/10 1400) FiO2 (%):  [40 %-50 %] 40 % (12/10 1104) Weight:  [92.5 kg] 92.5 kg (12/10 0500)  General - Well nourished, well developed elderly African-American  male,who is intubated Ophthalmologic - fundi not visualized due to noncooperation.  Cardiovascular - Regular rate and rhythm.  Neuro - he is sedated and intubated.he will not follow  any commands. He is restless and agitated.  PERRL,  Dolls eye moments are sluggish.  Does not blink to threat bilaterally..  Right facial droop, tongue midline.  Facial sensation symmetrical.    Moving all extremities symmetrically,  No focal weakness  Gait not tested.   ASSESSMENT/PLAN Mr. Marvin Mendez is a 72 y.o. male with history of hypertension, postural hypotension on Florinef, CKD, thrombocytopenia admitted for generalized weakness and lethargy started 5:30 PM 02/28/2018. No tPA given due to [place].    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 Ellene Route and he will follow up to repair AVM once pt stable  Resultant lethargy, nystagmus, right arm ataxia, respiratory distress  CT head cerebellar ICH with SDH  Repeat CT showed decreased right cerebellum and vermis ICH, stable right posterior fossa SDH MRI with and without contrast  No significant interval change in size and appearance of evolving intraparenchymal hemorrhage involving the right cerebellum and cerebellar vermis. Similar regional mass effect without.Associated small right posterior fossa and posterior right cerebral convexity subdural hematoma without significant mass effect, stable. Persistent small volume underlying subarachnoid hemorrhage. 9 mm acute ischemic nonhemorrhagic left cerebellar infarct adjacent to the cerebellar hemorrhage.Small left cerebellar AVM, presumably the source of hemorrhage.hydrocephalus or herniation.  CTA head and neck concerning left cerebellar AVM - Dr. Ellene Route will take care of it once pt stable  2D Echo  Left ventricular ejection fraction 55-60%. No wall motion abnormalities.Reubin Milan rule out echodense mass on right cuspid valve in apical views  LDL 76  HgbA1c 5.5  SCDs for VTE prophylaxis  NPO  No antithrombotic prior to admission, now on No antithrombotic.   Ongoing aggressive stroke risk factor  management  Therapy recommendations:  Pending   Disposition:  Pending   Respiratory distress  Intubated on admission  Extubated 02/15/18  Pt still has mild respiratory distress with difficulty handling secretions  On breathing treatment  Chest PT  CCM on board  Cerebral edema  Posterior fossa mass-effect  No hydrocephalus on CT so far  CT head and neck showed stable hematoma and no hydro  MRI brain 02/17/18 shows stable appearance of the cerebellar hemorrhage and mass effect. As well as small right posterior fossa and right cerebral convexity subdurals. 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 Small left cerebellar AVM  On 3% saline she was discontinued 05/18/17  Sodium 160-will wait for it to slowly come down and not use hypotonic fluids due to concerns about worsening cytotoxic edema   Sodium check every daily  Has central line placement  Hypertension . Stable  BP goal < 140  On low dose cleviprex   Consider po BP meds once pass swallow  Hyperlipidemia  Home meds:  none   LDL 76, goal < 70  No statin at this time  Dysphagia  Did not pass swallow  MBS today  If not passing swallow, may consider cortrak    CKD  Creatinine 1.5->1.5->1.34  On IV fluid  BMP monitoring  Other Stroke Risk Factors  Advanced age  Other Active Problems  Hyperglycemia, improved  Postural hypotension on Florinef at home  Thrombocytopenia platelet 147-> 138  Hypernatremia induced to control cytotoxic edema  Hospital day #  5 Patient remains intubated respiratory failure and is nauseated. Neurologically he remains unchanged but serum sodium remains quite high despite hypertonic saline drip being off for more than 2 days. Recommend start free water 200 mL every 4 hourly via NG tube. For correction of hypernatremia. D/w  Dr. Vaughan Browner critical care M.D. .Continue systolic blood pressure goal below 160   Use when necessary labetalol and  hydralazine.   This patient is critically ill due to cerebellar ICH, posterior fossa SDH, cerebral edema, respiratory distress hypertensive emergency and at significant risk of neurological worsening, death form hematoma expansion, cerebral edema, brain herniation, seizure, respiratory failure. This patient's care requires constant monitoring of vital signs, hemodynamics, respiratory and cardiac monitoring, review of multiple databases, neurological assessment, discussion with family, other specialists and medical decision making of high complexity. I spent 35 minutes of neurocritical care time in the care of this patient    Antony Contras, MD Stroke Neurology 02/20/2018 2:46 PM    To contact Stroke Continuity provider, please refer to [email or web address] After hours, contact General Neurology

## 2018-02-20 NOTE — Progress Notes (Signed)
PT Cancellation Note  Patient Details Name: MANI CELESTIN MRN: [id number] DOB: 07/29/1945   Cancelled Treatment:    Reason Eval/Treat Not Completed: Medical issues which prohibited therapy(pt sedated on vent in 3pt restraints )   Xzander Gilham B Cadyn Fann 02/20/2018, 8:54 AM  Elwyn Reach, PT Acute Rehabilitation Services Pager: [phone] Office: [phone]

## 2018-02-20 NOTE — Progress Notes (Signed)
Patient ID: Marvin Mendez, male   DOB: 01-05-1946, 72 y.o.   MRN: [id number] No recent reintubation Stop by to discuss with family presence of AVM and likely further work-up will be required They are receptive to this We will see how he does with weaning from the ventilator, question of whether he will need trach for airway protection for the interim

## 2018-02-20 NOTE — Progress Notes (Signed)
NAME:  Marvin Mendez, MRN:  [id number], DOB:  [birth year], LOS: 5 ADMISSION DATE:  02/16/2018, CONSULTATION DATE:  12/5 REFERRING MD:  Dr. Ellender Hose EDP, CHIEF COMPLAINT:  ICH   Brief History   72 year old male admitted for cerebellar ICH on 12/5. Intubated in ED for airway protection.  History of present illness   Patient is encephalopathic and/or intubated. Therefore history has been obtained from chart review.  72 year old male with PMH as below, which is significant for HTN, CKD 3, anemia (b12 deficient), thrombocytopenia, and autonomic postural hypotension (on florinef). He presented to outside hospital 12/4 with complaints of general malaise. While in ED there, he became progressively lethargic and required intubated for airway protection. CT of the head demonstrated cerebellar hemorrhage with evidence of tonsillar herniation. He was transferred to [facility] ED for neurosurgical evaluation. He was then seen by Dr. Ellene Route in ED, and was deemed to not be a surgical candidate and perhaps even a futile situation. PCCM asked to admit.   Of note. Family reports he is a very active man. Works out several times per week. Is very strong and independent.   Past Medical History   has a past medical history of Anemia, Hypertension, and Renal disorder.  Significant Hospital Events   12/5 - admitted 02/19/2018 limited CODE BLUE  Consults:  Neurosurgery - 12/5 Neurology - 12/5  Procedures:  ETT 12/4 > 02/15/2018  Significant Diagnostic Tests:  CT head 12/4 > acute IPH in the central cerebellum with estimated volume 29cc. Assocaited edema and mass effect with probable early herniation. Subarachnoid extension. Possible intraventricular extension into fourth ventricle.  CT head 12/5 >>> with decreased central cerebellar hemorrhage to 15 cc. CT Head 12/8>>> 1. Interval decrease in size of evolving acute central cerebellar hematoma (8 cc, previously 16 cc). Persistent regional mass effect without  obstructive hydrocephalus. 2. Persistent small subdural hematoma overlying the right posterior fossa and along the falx and tentorium without mass effect, similar to previous. Persistent trace subarachnoid hemorrhage.   Micro Data:  02/19/2018 sputum>> 02/19/2018 blood cultures x2>>  Antimicrobials:  Vanco 12/9 > Cefepime 12/19>  Interim history/subjective:  Sedated on a ventilator  Objective   Blood pressure (!) 144/87, pulse 92, temperature 99.1 F (37.3 C), temperature source Axillary, resp. rate (!) 22, height 6' (1.829 m), weight 92.5 kg, SpO2 100 %.    Vent Mode: PRVC FiO2 (%):  [40 %-100 %] 40 % Set Rate:  [18 bmp] 18 bmp Vt Set:  [550 mL-620 mL] 620 mL PEEP:  [10 cmH20] 10 cmH20 Plateau Pressure:  [16 cmH20-24 cmH20] 16 cmH20   Intake/Output Summary (Last 24 hours) at 02/20/2018 0920 Last data filed at 02/20/2018 0700 Gross per 24 hour  Intake 1061.93 ml  Output 795 ml  Net 266.93 ml   Filed Weights   02/18/18 0630 02/19/18 0211 02/20/18 0500  Weight: 95.4 kg 91.2 kg 92.5 kg    Examination: General: Elderly male sedated on the vent HEENT: Tracheal tube in place, pupils equal reactive to light Neuro: Sedated does not follow commands at this time CV: Heart sounds are regular tachycardic at 104 PULM: even/non-labored, lungs bilaterally diminished in bases SJ:GGEZ, non-tender, bsx4 active  Extremities: warm/dry, 1+ edema  Skin: no rashes or lesions .  Resolved Hospital Problem list     Assessment & Plan:   Intracerebral Hemorrhage: Cerebellar bleed with subarachnoid and possibly intraventricular extension. He was reportedly on plavix at baseline. Not candidate for surgical intervention based per  neurosurgery.  Worsening mental status 02/19/2018 AMS PLAN -  Continue intensive care unit now intubated Neurology neurosurgery following  Dr. sodium off 3% saline, noted to be 161 on 02/20/2018  Inability to protect airway:  Intubated on admit.  Extubated  12/5.  Some concern for ongoing airway protection and ?aspiration . Reintubated 02/19/2018 PLAN-  Limited CODE BLUE not have a tracheostomy Goals of care were discussed with family they are limited with no long-term invasive interventions..  ?aspiration  02/20/2018 procalcitonin 0.25 PLAN -  Follow culture data Vancomycin and cefepime started 02/19/2018  CKD III: Creatinine on admision 1.55. No baseline to compare.  02/15/2018 creatinine is 1.50.  02/16/2018 creatinine 1.34, 02/20/2018 creatinine is gone from 1.61-2.21 Continue to monitor urine output and creatinine Avoid nephrotoxins  Postural hypotension Monitor blood pressure Currently on Florinef  Hyperglycemia without history of DM  Insulin sliding scale protocol  Best practice:  Diet: NPO, tube feeding pending nutritional consult Pain/Anxiety/Delirium sedation for tube tolerance as needed VAP protocol (if indicated): y DVT prophylaxis: SCDs GI prophylaxis: Protonix Glucose control: SSI Mobility: Increase mobility Code Status: Currently limited code after discussion per Dr. Vaughan Browner and family about no long-term intubation or tracheostomy. Family Communication: 02/20/2018 no family at bedside Disposition: Intensive care unit reintubated on 02/19/2018  Labs   CBC: Recent Labs  Lab 03/12/2018 2349 02/13/2018 2355 02/15/18 0326 02/17/18 0523 02/19/18 1009 02/20/18 0500  WBC 9.5  --  8.4 12.0* 8.7 9.6  NEUTROABS 7.3  --   --   --  6.8  --   HGB 12.4* 13.9 12.1* 11.4* 12.0* 11.7*  HCT 40.3 41.0 36.9* 36.0* 37.8* 38.4*  MCV 96.9  --  94.9 97.8 95.2 97.7  PLT 147*  --  138* 95* 84* 76*    Basic Metabolic Panel: Recent Labs  Lab 02/15/18 0326  02/16/18 0408  02/17/18 0523  02/18/18 0250 02/18/18 0800 02/19/18 0525 02/19/18 1009 02/20/18 0500  NA 137   < > 148*   < > 156*   < > 159* 159* 160* 159* 161*  K 4.5  --  3.9  --  4.2  --   --   --   --  3.2* 3.7  CL 108  --  121*  --  128*  --   --   --   --  129* >130*    CO2 16*  --  20*  --  20*  --   --   --   --  22 20*  GLUCOSE 182*  --  121*  --  131*  --   --   --   --  128* 122*  BUN 21  --  14  --  14  --   --   --   --  23 37*  CREATININE 1.50*  --  1.34*  --  1.62*  --   --   --   --  1.61* 2.21*  CALCIUM 8.8*  --  8.8*  --  8.8*  --   --   --   --  8.7* 8.8*  MG 2.0  --   --   --  2.0  --   --   --   --   --  2.0  PHOS 2.6  --  1.5*  --  2.1*  --   --   --   --   --  2.0*   < > = values in this interval not displayed.   GFR:  Estimated Creatinine Clearance: 32.2 mL/min (A) (by C-G formula based on SCr of 2.21 mg/dL (H)). Recent Labs  Lab 02/15/18 0326 02/17/18 0523 02/19/18 1009 02/20/18 0500  PROCALCITON  --   --  0.12 0.25  WBC 8.4 12.0* 8.7 9.6  LATICACIDVEN  --   --  1.0  --     Liver Function Tests: Recent Labs  Lab 02/19/18 1009  AST 48*  ALT 27  ALKPHOS 52  BILITOT 1.3*  PROT 6.9  ALBUMIN 2.8*   No results for input(s): LIPASE, AMYLASE in the last 168 hours. No results for input(s): AMMONIA in the last 168 hours.  ABG    Component Value Date/Time   PHART 7.327 (L) 02/19/2018 1335   PCO2ART 46.9 02/19/2018 1335   PO2ART 452.0 (H) 02/19/2018 1335   HCO3 24.6 02/19/2018 1335   TCO2 26 02/19/2018 1335   ACIDBASEDEF 1.0 02/19/2018 1335   O2SAT 100.0 02/19/2018 1335     Coagulation Profile: Recent Labs  Lab 02/20/2018 2349  INR 1.08    Cardiac Enzymes: No results for input(s): CKTOTAL, CKMB, CKMBINDEX, TROPONINI in the last 168 hours.  HbA1C: Hgb A1c MFr Bld  Date/Time Value Ref Range Status  03/08/2018 11:49 PM 5.5 4.8 - 5.6 % Final    Comment:    (NOTE) Pre diabetes:          5.7%-6.4% Diabetes:              >6.4% Glycemic control for   <7.0% adults with diabetes     CBG: Recent Labs  Lab 02/19/18 1550 02/19/18 2004 02/19/18 2347 02/20/18 0401 02/20/18 0815  GLUCAP 117* 103* 111* 110* 103*    App CCT 35 min   Richardson Landry Destaney Sarkis ACNP Maryanna Shape PCCM Pager [phone] till 1 pm If no answer page  [phone] 02/20/2018, 9:21 AM

## 2018-02-20 NOTE — Progress Notes (Signed)
Initial Nutrition Assessment  DOCUMENTATION CODES:   Not applicable  INTERVENTION:   Initiate Vital AF 1.2 @ 70 ml/hr via OG tube  Provides: 2016 kcal, 126 grams protein, and 1362 ml free water.    NUTRITION DIAGNOSIS:   Inadequate oral intake related to inability to eat as evidenced by NPO status.  GOAL:   Patient will meet greater than or equal to 90% of their needs  MONITOR:   TF tolerance, Vent status, Labs  REASON FOR ASSESSMENT:   Consult, Ventilator Enteral/tube feeding initiation and management  ASSESSMENT:   Pt with PMH of anemia (b12 deficient), CKD stage 3, and HTN admitted from [facility] with [place] not a candidate for surgery but did receive 3%. He was extubated 12/5 and started on diet. 12/9 possible aspiration event and re-intubated.    Pt discussed during ICU rounds and with RN.  Per RN possible aspiration then re-intubated  Patient is currently intubated on ventilator support MV: 11.9 L/min Temp (24hrs), Avg:98.9 F (37.2 C), Min:97.9 F (36.6 C), Max:99.5 F (37.5 C)  Medications reviewed  Labs reviewed: Na 161 (H) now off 3%, Cl >130 (H), PO4: 2 (L)    NUTRITION - FOCUSED PHYSICAL EXAM:   Unable to complete Nutrition-Focused physical exam at this time.    Diet Order:   Diet Order            Diet NPO time specified  Diet effective now              EDUCATION NEEDS:   No education needs have been identified at this time  Skin:  Skin Assessment: Reviewed RN Assessment  Last BM:  unknown  Height:   Ht Readings from Last 1 Encounters:  02/15/18 6' (1.829 m)    Weight:   Wt Readings from Last 1 Encounters:  02/20/18 92.5 kg    Ideal Body Weight:     BMI:  Body mass index is 27.66 kg/m.  Estimated Nutritional Needs:   Kcal:  2044  Protein:  115-130 grams  Fluid:  > 2 L/day  Maylon Peppers RD, LDN, CNSC [phone] Pager [phone] After Hours Pager

## 2018-02-20 NOTE — Progress Notes (Signed)
eLink Physician-Brief Progress Note Patient Name: Marvin Mendez DOB: 30-Jul-1945 MRN: [id number]   Date of Service  02/20/2018  HPI/Events of Note  Restraints renewal requested by RN  eICU Interventions  2 point soft restraints and lap belt reordered.     Intervention Category Minor Interventions: Other:  [name] 02/20/2018, 9:58 PM

## 2018-02-21 ENCOUNTER — Inpatient Hospital Stay (HOSPITAL_COMMUNITY): Payer: PPO

## 2018-02-21 LAB — CBC WITH DIFFERENTIAL/PLATELET
ABS IMMATURE GRANULOCYTES: 0.14 10*3/uL — AB (ref 0.00–0.07)
Basophils Absolute: 0 10*3/uL (ref 0.0–0.1)
Basophils Relative: 0 %
Eosinophils Absolute: 0.2 10*3/uL (ref 0.0–0.5)
Eosinophils Relative: 2 %
HCT: 35.2 % — ABNORMAL LOW (ref 39.0–52.0)
Hemoglobin: 10.7 g/dL — ABNORMAL LOW (ref 13.0–17.0)
IMMATURE GRANULOCYTES: 1 %
Lymphocytes Relative: 15 %
Lymphs Abs: 1.5 10*3/uL (ref 0.7–4.0)
MCH: 30.1 pg (ref 26.0–34.0)
MCHC: 30.4 g/dL (ref 30.0–36.0)
MCV: 98.9 fL (ref 80.0–100.0)
MONO ABS: 0.7 10*3/uL (ref 0.1–1.0)
Monocytes Relative: 7 %
Neutro Abs: 7.8 10*3/uL — ABNORMAL HIGH (ref 1.7–7.7)
Neutrophils Relative %: 75 %
Platelets: 61 10*3/uL — ABNORMAL LOW (ref 150–400)
RBC: 3.56 MIL/uL — ABNORMAL LOW (ref 4.22–5.81)
RDW: 13.8 % (ref 11.5–15.5)
WBC: 10.4 10*3/uL (ref 4.0–10.5)
nRBC: 0.2 % (ref 0.0–0.2)

## 2018-02-21 LAB — GLUCOSE, CAPILLARY
Glucose-Capillary: 116 mg/dL — ABNORMAL HIGH (ref 70–99)
Glucose-Capillary: 126 mg/dL — ABNORMAL HIGH (ref 70–99)
Glucose-Capillary: 126 mg/dL — ABNORMAL HIGH (ref 70–99)
Glucose-Capillary: 129 mg/dL — ABNORMAL HIGH (ref 70–99)
Glucose-Capillary: 130 mg/dL — ABNORMAL HIGH (ref 70–99)
Glucose-Capillary: 136 mg/dL — ABNORMAL HIGH (ref 70–99)

## 2018-02-21 LAB — BASIC METABOLIC PANEL
BUN: 50 mg/dL — ABNORMAL HIGH (ref 8–23)
CO2: 20 mmol/L — ABNORMAL LOW (ref 22–32)
CREATININE: 2.5 mg/dL — AB (ref 0.61–1.24)
Calcium: 8.5 mg/dL — ABNORMAL LOW (ref 8.9–10.3)
Chloride: >130 mmol/L (ref 98–111)
GFR calc Af Amer: 28 mL/min — ABNORMAL LOW (ref >60)
GFR calc non Af Amer: 24 mL/min — ABNORMAL LOW (ref >60)
Glucose, Bld: 147 mg/dL — ABNORMAL HIGH (ref 70–99)
Potassium: 3.7 mmol/L (ref 3.5–5.1)
Sodium: 159 mmol/L — ABNORMAL HIGH (ref 135–145)

## 2018-02-21 LAB — MAGNESIUM
Magnesium: 2.1 mg/dL (ref 1.7–2.4)
Magnesium: 2 mg/dL (ref 1.7–2.4)

## 2018-02-21 LAB — PHOSPHORUS
PHOSPHORUS: 3.1 mg/dL (ref 2.5–4.6)
Phosphorus: 1.3 mg/dL — ABNORMAL LOW (ref 2.5–4.6)

## 2018-02-21 LAB — URINE CULTURE: Culture: NO GROWTH

## 2018-02-21 LAB — PROCALCITONIN: Procalcitonin: 0.35 ng/mL

## 2018-02-21 MED ORDER — DEXTROSE 5 % IV SOLN
INTRAVENOUS | Status: DC
  Administered 2018-02-21 – 2018-02-24 (×5): via INTRAVENOUS

## 2018-02-21 MED ORDER — FLUDROCORTISONE ACETATE 0.1 MG PO TABS
0.1000 mg | ORAL_TABLET | ORAL | Status: DC
  Administered 2018-02-21 – 2018-02-23 (×2): 0.1 mg
  Filled 2018-02-21 (×2): qty 1

## 2018-02-21 MED ORDER — POTASSIUM PHOSPHATES 15 MMOLE/5ML IV SOLN
30.0000 mmol | Freq: Once | INTRAVENOUS | Status: AC
  Administered 2018-02-21: 30 mmol via INTRAVENOUS
  Filled 2018-02-21: qty 10

## 2018-02-21 NOTE — Progress Notes (Signed)
STROKE TEAM PROGRESS NOTE   SUBJECTIVE (INTERVAL HISTORY) His RN is at the bedside.    He remains intubated and  sedated.  Serum sodium is down to 159and hypertonic saline has been discontinued 3 days ago . Blood pressure has been adequately controlled blood cultures are negative so far.   OBJECTIVE Temp:  [98.7 F (37.1 C)-99.5 F (37.5 C)] 99.1 F (37.3 C) (12/11 1600) Pulse Rate:  [67-82] 72 (12/11 1700) Cardiac Rhythm: Normal sinus rhythm (12/11 0800) Resp:  [11-24] 19 (12/11 1700) BP: (84-119)/(58-75) 118/71 (12/11 1700) SpO2:  [97 %-100 %] 99 % (12/11 1700) FiO2 (%):  [40 %] 40 % (12/11 1538) Weight:  [94.4 kg] 94.4 kg (12/11 0423)  Recent Labs  Lab 02/20/18 2010 02/21/18 0006 02/21/18 0347 02/21/18 0824 02/21/18 1150  GLUCAP 111* 116* 126* 126* 129*   Recent Labs  Lab 02/15/18 0326  02/16/18 0408  02/17/18 0523  02/18/18 0800 02/19/18 0525 02/19/18 1009 02/20/18 0500 02/21/18 0649  NA 137   < > 148*   < > 156*   < > 159* 160* 159* 161* 159*  K 4.5  --  3.9  --  4.2  --   --   --  3.2* 3.7 3.7  CL 108  --  121*  --  128*  --   --   --  129* >130* >130*  CO2 16*  --  20*  --  20*  --   --   --  22 20* 20*  GLUCOSE 182*  --  121*  --  131*  --   --   --  128* 122* 147*  BUN 21  --  14  --  14  --   --   --  23 37* 50*  CREATININE 1.50*  --  1.34*  --  1.62*  --   --   --  1.61* 2.21* 2.50*  CALCIUM 8.8*  --  8.8*  --  8.8*  --   --   --  8.7* 8.8* 8.5*  MG 2.0  --   --   --  2.0  --   --   --   --  2.0 2.1  PHOS 2.6  --  1.5*  --  2.1*  --   --   --   --  2.0* 1.3*   < > = values in this interval not displayed.   Recent Labs  Lab 02/19/18 1009  AST 48*  ALT 27  ALKPHOS 52  BILITOT 1.3*  PROT 6.9  ALBUMIN 2.8*   Recent Labs  Lab 02/22/2018 2349  02/15/18 0326 02/17/18 0523 02/19/18 1009 02/20/18 0500 02/21/18 0649  WBC 9.5  --  8.4 12.0* 8.7 9.6 10.4  NEUTROABS 7.3  --   --   --  6.8  --  7.8*  HGB 12.4*   < > 12.1* 11.4* 12.0* 11.7* 10.7*  HCT  40.3   < > 36.9* 36.0* 37.8* 38.4* 35.2*  MCV 96.9  --  94.9 97.8 95.2 97.7 98.9  PLT 147*  --  138* 95* 84* 76* 61*   < > = values in this interval not displayed.   No results for input(s): CKTOTAL, CKMB, CKMBINDEX, TROPONINI in the last 168 hours. No results for input(s): LABPROT, INR in the last 72 hours. Recent Labs    02/20/18 1103  COLORURINE YELLOW  LABSPEC 1.020  PHURINE 5.0  GLUCOSEU NEGATIVE  HGBUR MODERATE*  BILIRUBINUR NEGATIVE  KETONESUR 5*  PROTEINUR 30*  NITRITE NEGATIVE  LEUKOCYTESUR NEGATIVE       Component Value Date/Time   CHOL 134 02/16/2018 0408   TRIG 74 02/16/2018 0408   HDL 43 02/16/2018 0408   CHOLHDL 3.1 02/16/2018 0408   VLDL 15 02/16/2018 0408   LDLCALC 76 02/16/2018 0408   Lab Results  Component Value Date   HGBA1C 5.5 02/18/2018   No results found for: LABOPIA, COCAINSCRNUR, LABBENZ, AMPHETMU, THCU, LABBARB  No results for input(s): ETH in the last 168 hours.  I have personally reviewed the radiological images below and agree with the radiology interpretations.  Ct Angio Head W Or Wo Contrast  Result Date: 02/15/2018 CLINICAL DATA:  72 y/o  M; follow-up of intracranial hemorrhage. EXAM: CT ANGIOGRAPHY HEAD AND NECK TECHNIQUE: Multidetector CT imaging of the head and neck was performed using the standard protocol during bolus administration of intravenous contrast. Multiplanar CT image reconstructions and MIPs were obtained to evaluate the vascular anatomy. Carotid stenosis measurements (when applicable) are obtained utilizing NASCET criteria, using the distal internal carotid diameter as the denominator. CONTRAST:  45mL ISOVUE-370 IOPAMIDOL (ISOVUE-370) INJECTION 76% COMPARISON:  02/23/2018 CT head. FINDINGS: CTA NECK FINDINGS Aortic arch: Standard branching. Imaged portion shows no evidence of aneurysm or dissection. No significant stenosis of the major arch vessel origins. Right carotid system: No evidence of dissection, stenosis (50% or  greater) or occlusion. Left carotid system: No evidence of dissection, stenosis (50% or greater) or occlusion. Vertebral arteries: Codominant. No evidence of dissection, stenosis (50% or greater) or occlusion. Skeleton: Mild-to-moderate cervical spondylosis with multilevel disc and facet degenerative changes. No high-grade bony canal stenosis. Other neck: Negative. Upper chest: Negative. Review of the MIP images confirms the above findings CTA HEAD FINDINGS Anterior circulation: No significant stenosis, proximal occlusion, aneurysm, or vascular malformation. Posterior circulation: Mild stenosis of the left vertebral artery at the left PICA origin. The left PICA asymmetrically enlarged with a branch extending to a cluster of vessels within the posteromedial aspect of the left cerebellar hemisphere spanning approximately 11 mm (series 12, image [id number] and series 13, image 105, also see sagittal MIPS). The right vertebral, basilar, and bilateral posterior cerebral arteries are unremarkable. Venous sinuses: Hyperdense prominent venous structure within the medial cerebellum, possibly a draining vein from the left cerebellar vascular malformation (series 12, image 22). Anatomic variants: None significant. Delayed phase: No abnormal intracranial enhancement. Review of the MIP images confirms the above findings IMPRESSION: CTA head: 1. Small group of abnormal vessels within the posteromedial aspect of left cerebellar hemisphere spanning approximately 11 mm, suspected arteriovenous malformation. 2. Mild stenosis of left vertebral artery and PICA origin. 3. No additional aneurysm, stenosis, vascular malformation, or occlusion of the anterior and posterior circulation. CTA neck: Patent carotid and vertebral arteries of the neck. No dissection, aneurysm, or hemodynamically significant stenosis by NASCET criteria. These results will be called to the ordering clinician or representative by the Radiologist Assistant, and  communication documented in the PACS or zVision Dashboard. Electronically Signed   By: Kristine Garbe M.D.   On: 02/15/2018 19:17   Ct Head Wo Contrast  Result Date: 02/18/2018 CLINICAL DATA:  Follow-up examination for intracranial hemorrhage. EXAM: CT HEAD WITHOUT CONTRAST TECHNIQUE: Contiguous axial images were obtained from the base of the skull through the vertex without intravenous contrast. COMPARISON:  Prior CT from 03/06/2018. FINDINGS: Brain: Involving central cerebellar hemorrhage decreased in size now measuring 2.5 x 2.7 x 2.5 cm (8.4 cc), previously 2.8 x 3.3 x 3.3 cm (16  cc). Subdural extension with small right parafalcine and tentorial hematoma again noted, similar. Subdural hematoma at the right posterior fossa little interval changed measuring up to 4-5 mm. Persistent localized mass effect with effacement of the suprasellar cistern. Mass effect on the adjacent fourth ventricle which remains partially effaced, relatively similar. No hydrocephalus. Trace subarachnoid hemorrhage within the right temporal occipital region persist. No other new acute intracranial abnormality. No acute large vessel territory infarct. Vascular: No hyperdense vessel. Skull: Scalp soft tissues and calvarium demonstrate no acute finding. Sinuses/Orbits: Globes and orbital soft tissues within normal limits. Scattered mucosal thickening throughout the ethmoidal air cells. Paranasal sinuses are otherwise grossly clear. No mastoid effusion. Other: None. IMPRESSION: 1. Interval decrease in size of evolving acute central cerebellar hematoma (8 cc, previously 16 cc). Persistent regional mass effect without obstructive hydrocephalus. 2. Persistent small subdural hematoma overlying the right posterior fossa and along the falx and tentorium without mass effect, similar to previous. Persistent trace subarachnoid hemorrhage. Electronically Signed   By: Jeannine Boga M.D.   On: 02/18/2018 03:44   Ct Head Wo  Contrast  Result Date: 02/15/2018 CLINICAL DATA:  Altered mental status. Follow-up intracranial hemorrhage. EXAM: CT HEAD WITHOUT CONTRAST TECHNIQUE: Contiguous axial images were obtained from the base of the skull through the vertex without intravenous contrast. COMPARISON:  CT HEAD February 14, 2018 at 1815 hours FINDINGS: BRAIN: Central cerebellar 2.8 x 3.3 x 3.3 cm (volume = 16 cc) intraparenchymal hematoma was 30 cc. Subdural extension with decreased 2-3 mm falcotentorial subdural hematoma. RIGHT posterior fossa subdural hematoma measuring to 4 mm tracking into the included cervical spine. Effaced supra cerebellar cistern. Regional mass effect resulting in narrowed fourth ventricle without hydrocephalus. Trace subarachnoid hemorrhage. No acute large vascular territory infarct. VASCULAR: Trace calcific atherosclerosis of the carotid siphons. SKULL: No skull fracture. No significant scalp soft tissue swelling. SINUSES/ORBITS: Mild paranasal sinus mucosal thickening. Mastoid air cells are well aerated.The included ocular globes and orbital contents are non-suspicious. OTHER: None. IMPRESSION: 1. Evolving acute central cerebellar hematoma (16 cc, previously 30 cc). Regional mass effect without obstructive hydrocephalus. 2. Decreased falcotentorial and RIGHT posterior fossa subdural hematomas extending into included cervical spine. Small volume subarachnoid hemorrhage. Electronically Signed   By: Elon Alas M.D.   On: 02/15/2018 01:16   Ct Angio Neck W Or Wo Contrast  Result Date: 02/15/2018 CLINICAL DATA:  72 y/o  M; follow-up of intracranial hemorrhage. EXAM: CT ANGIOGRAPHY HEAD AND NECK TECHNIQUE: Multidetector CT imaging of the head and neck was performed using the standard protocol during bolus administration of intravenous contrast. Multiplanar CT image reconstructions and MIPs were obtained to evaluate the vascular anatomy. Carotid stenosis measurements (when applicable) are obtained utilizing  NASCET criteria, using the distal internal carotid diameter as the denominator. CONTRAST:  75mL ISOVUE-370 IOPAMIDOL (ISOVUE-370) INJECTION 76% COMPARISON:  02/20/2018 CT head. FINDINGS: CTA NECK FINDINGS Aortic arch: Standard branching. Imaged portion shows no evidence of aneurysm or dissection. No significant stenosis of the major arch vessel origins. Right carotid system: No evidence of dissection, stenosis (50% or greater) or occlusion. Left carotid system: No evidence of dissection, stenosis (50% or greater) or occlusion. Vertebral arteries: Codominant. No evidence of dissection, stenosis (50% or greater) or occlusion. Skeleton: Mild-to-moderate cervical spondylosis with multilevel disc and facet degenerative changes. No high-grade bony canal stenosis. Other neck: Negative. Upper chest: Negative. Review of the MIP images confirms the above findings CTA HEAD FINDINGS Anterior circulation: No significant stenosis, proximal occlusion, aneurysm, or vascular malformation. Posterior circulation: Mild  stenosis of the left vertebral artery at the left PICA origin. The left PICA asymmetrically enlarged with a branch extending to a cluster of vessels within the posteromedial aspect of the left cerebellar hemisphere spanning approximately 11 mm (series 12, image 157-158 and series 13, image 105, also see sagittal MIPS). The right vertebral, basilar, and bilateral posterior cerebral arteries are unremarkable. Venous sinuses: Hyperdense prominent venous structure within the medial cerebellum, possibly a draining vein from the left cerebellar vascular malformation (series 12, image 22). Anatomic variants: None significant. Delayed phase: No abnormal intracranial enhancement. Review of the MIP images confirms the above findings IMPRESSION: CTA head: 1. Small group of abnormal vessels within the posteromedial aspect of left cerebellar hemisphere spanning approximately 11 mm, suspected arteriovenous malformation. 2. Mild  stenosis of left vertebral artery and PICA origin. 3. No additional aneurysm, stenosis, vascular malformation, or occlusion of the anterior and posterior circulation. CTA neck: Patent carotid and vertebral arteries of the neck. No dissection, aneurysm, or hemodynamically significant stenosis by NASCET criteria. These results will be called to the ordering clinician or representative by the Radiologist Assistant, and communication documented in the PACS or zVision Dashboard. Electronically Signed   By: Kristine Garbe M.D.   On: 02/15/2018 19:17   Mr Jeri Cos XN Contrast  Result Date: 02/17/2018 CLINICAL DATA:  Follow-up exam for intracranial hemorrhage. EXAM: MRI HEAD WITHOUT AND WITH CONTRAST TECHNIQUE: Multiplanar, multiecho pulse sequences of the brain and surrounding structures were obtained without and with intravenous contrast. CONTRAST:  90 cc of Gadavist. COMPARISON:  Comparison made with prior CTA from 02/15/2018 as well as previous CTs from 02/13/2018. FINDINGS: Brain: Intraparenchymal hemorrhage involving the cerebellar vermis and right cerebellar hemisphere again seen, relatively stable in size measuring approximately 3.6 x 2.9 x 3.2 cm. Localized edema with regional mass effect similar as well. Partial effacement of the fourth ventricle and fourth ventricular outflow tract anteriorly which remain patent. Ventricular size is stable without hydrocephalus or ventricular trapping. Slight asymmetry of the lateral ventricles with the left larger than the right noted, likely congenital. Mild mass effect on the brainstem anteriorly without frank compression. No transtentorial herniation. Probable enhancing tangle of vessels at the adjacent posterior left cerebellum suspicious for AVM (series 18, image 16, also seen on prior CTA. No other abnormal enhancement seen underlying the hematoma. Right posterior fossa of subdural hematoma relatively stable measuring up to 4 mm in thickness. Additional small  subdural collection overlying the posterior right cerebral convexity measures up to 2 mm without mass effect, likely small volume subdural hemorrhage and/or reactive hygroma. Scattered small volume subarachnoid hemorrhage present within the underlying right temporal occipital region (series 13, image 30). Smooth dural enhancement overlying the right cerebral convexity likely reactive. 9 mm acute ischemic left cerebellar infarct seen adjacent to the hematoma (series 5, image 58). No other evidence for acute ischemia. Underlying cerebral volume normal. Mild chronic microvascular ischemic changes noted within the periventricular white matter. No other areas of chronic infarction. No mass lesion or abnormal enhancement elsewhere within the brain. Vascular: Major intracranial vascular flow voids maintained left cerebellar AVM measures approximately 13 x 18 mm (series 12, image 7). Skull and upper cervical spine: Craniocervical junction within normal limits. Upper cervical spine normal. Bone marrow signal intensity normal. No scalp soft tissue abnormality. Sinuses/Orbits: Globes and orbital soft tissues within normal limits. Mild scattered mucosal thickening throughout the ethmoidal air cells. Paranasal sinuses are otherwise clear. Trace right mastoid effusion noted, of doubtful significance. Inner ear structures normal. Other:  None. IMPRESSION: 1. No significant interval change in size and appearance of evolving intraparenchymal hemorrhage involving the right cerebellum and cerebellar vermis. Similar regional mass effect without hydrocephalus or herniation. 2. Associated small right posterior fossa and posterior right cerebral convexity subdural hematoma without significant mass effect, stable. Persistent small volume underlying subarachnoid hemorrhage. 3. 9 mm acute ischemic nonhemorrhagic left cerebellar infarct adjacent to the cerebellar hemorrhage. 4. Small left cerebellar AVM, presumably the source of hemorrhage.  Electronically Signed   By: Jeannine Boga M.D.   On: 02/17/2018 03:39   Dg Chest Port 1 View  Result Date: 02/21/2018 CLINICAL DATA:  ET tube EXAM: PORTABLE CHEST 1 VIEW COMPARISON:  02/20/2018 FINDINGS: Support devices are stable. Low lung volumes. Mild cardiomegaly and vascular congestion. Left lower lobe atelectasis or infiltrate. No effusions or acute bony abnormality. IMPRESSION: Low lung volumes. Left lower lobe atelectasis or infiltrate. Cardiomegaly, vascular congestion. Electronically Signed   By: Rolm Baptise M.D.   On: 02/21/2018 09:30   Dg Chest Port 1 View  Result Date: 02/20/2018 CLINICAL DATA:  Acute respiratory failure, intubation EXAM: PORTABLE CHEST 1 VIEW COMPARISON:  Portable exam 0555 hours compared to 02/19/2018 FINDINGS: Tip of endotracheal tube projects 4.0 cm above carina. Nasogastric tube coiled in proximal stomach. LEFT jugular central venous catheter with tip projecting over SVC. Normal heart size and mediastinal contours. Elevation of LEFT diaphragm with bibasilar atelectasis. Peribronchial thickening. No acute infiltrate, pleural effusion or pneumothorax. IMPRESSION: Bibasilar atelectasis. Electronically Signed   By: Lavonia Dana M.D.   On: 02/20/2018 09:03   Dg Chest Port 1 View  Result Date: 02/19/2018 CLINICAL DATA:  Intubation. Acute respiratory insufficiency. EXAM: PORTABLE CHEST 1 VIEW 12:58 p.m. COMPARISON:  Chest x-rays dated 02/19/2018 at 5:05 a.m. and 02/17/2018 FINDINGS: Endotracheal tube tip is at the level of the thoracic inlet 6.8 cm above the carina. Central venous catheter tip is in the superior vena cava at the level of the azygos vein. NG tube tip is in the fundus of the stomach. There is chronic elevation of the left hemidiaphragm. Heart size and vascularity are normal. No infiltrates or effusions. No acute bone abnormality. IMPRESSION: Endotracheal tube and central line appear in good position. No acute cardiopulmonary findings. Electronically  Signed   By: Lorriane Shire M.D.   On: 02/19/2018 13:15   Dg Chest Portable 1 View  Result Date: 02/19/2018 CLINICAL DATA:  Aspiration, shortness of breath EXAM: PORTABLE CHEST 1 VIEW COMPARISON:  02/17/2018 FINDINGS: Left central line remains in place, unchanged. Mild cardiomegaly. Elevation of the left hemidiaphragm with left base atelectasis or infiltrate. No confluent opacity on the right. Mild vascular congestion. No acute bony abnormality. IMPRESSION: Left base atelectasis or infiltrate, similar to prior study. Cardiomegaly, vascular congestion. Electronically Signed   By: Rolm Baptise M.D.   On: 02/19/2018 07:37   Dg Chest Port 1 View  Result Date: 02/17/2018 CLINICAL DATA:  Respiratory failure EXAM: PORTABLE CHEST 1 VIEW COMPARISON:  1 day prior FINDINGS: Left internal jugular line tip at mid SVC. Cardiomegaly accentuated by AP portable technique. Possible small left pleural effusion. No pneumothorax. Low lung volumes with resultant pulmonary interstitial prominence. Left lower lobe airspace disease is similar. Right perihilar airspace disease is slightly increased. IMPRESSION: Bilateral airspace disease and progressive, similar on the left on the right. Infection and/or alveolar pulmonary edema. Electronically Signed   By: Abigail Miyamoto M.D.   On: 02/17/2018 08:56   Dg Chest Port 1 View  Result Date: 02/16/2018 CLINICAL DATA:  Respiratory failure EXAM: PORTABLE CHEST 1 VIEW COMPARISON:  02/15/2018 FINDINGS: Left jugular central line is again identified and stable. Cardiac shadow is stable. Increasing left basilar infiltrate with likely small effusion is noted. No pneumothorax is seen. Mild right basilar atelectasis is seen. No bony abnormality is noted. IMPRESSION: Increasing left basilar infiltrate with small effusion. Increasing right basilar atelectasis. Electronically Signed   By: Inez Catalina M.D.   On: 02/16/2018 07:34   Dg Chest Port 1 View  Result Date: 02/15/2018 CLINICAL DATA:   Central line placement. EXAM: PORTABLE CHEST 1 VIEW COMPARISON:  Tool 07/2017 FINDINGS: Left internal jugular approach central venous catheter terminates in the expected location of the proximal superior vena cava. The patient has been extubated. Enlarged cardiac silhouette.  Left lower lobe airspace consolidation No evidence of pneumothorax. Elevation of the left hemidiaphragm with airspace consolidation versus atelectasis in the left lower lobe. Left pleural effusion is not excluded. Osseous structures are without acute abnormality. Soft tissues are grossly normal. IMPRESSION: Status post placement of left internal jugular approach central venous catheter with tip in the expected location of the proximal superior vena cava. No evidence of pneumothorax. Electronically Signed   By: Fidela Salisbury M.D.   On: 02/15/2018 17:06   Dg Chest Port 1 View  Result Date: 02/15/2018 CLINICAL DATA:  72 year old male status post intubation. EXAM: PORTABLE CHEST 1 VIEW COMPARISON:  Earlier radiograph dated 03/12/2018 FINDINGS: Endotracheal tube above the carina in similar position. Interval placement of an enteric tube which appears to extend below the diaphragm with tip in the left upper quadrant likely in the gastric fundus. There is shallow inspiration with bibasilar atelectasis. Mild eventration of the left hemidiaphragm. No focal consolidation, pleural effusion, or pneumothorax. Stable cardiac silhouette. No acute osseous pathology. IMPRESSION: Interval placement of an enteric tube with tip likely in the gastric fundus. No other interval change. Electronically Signed   By: Anner Crete M.D.   On: 02/15/2018 01:18   Dg Abd Portable 1v  Result Date: 02/19/2018 CLINICAL DATA:  OG tube placement. EXAM: PORTABLE ABDOMEN - 1 VIEW COMPARISON:  None FINDINGS: OG tube tip is in the fundus of the stomach. Bowel gas pattern is normal. Increased density in the right side of the abdomen may be due to an enlarged right  lobe of the liver. Lumbar chronic elevation of the left hemidiaphragm. Scoliosis. IMPRESSION: OG tube tip in good position in the fundus of the stomach. Possible enlargement of the right lobe of the liver. Electronically Signed   By: Lorriane Shire M.D.   On: 02/19/2018 13:16   Dg Swallowing Func-speech Pathology  Result Date: 02/16/2018 Objective Swallowing Evaluation: Type of Study: MBS-Modified Barium Swallow Study  Patient Details Name: KARRY CAUSER MRN: [id number] Date of Birth: 11/03/1945 Today's Date: 02/16/2018 Time: SLP Start Time (ACUTE ONLY): 1210 -SLP Stop Time (ACUTE ONLY): 1235 SLP Time Calculation (min) (ACUTE ONLY): 25 min Past Medical History: Past Medical History: Diagnosis Date . Anemia  . Hypertension  . Renal disorder  Past Surgical History: No past surgical history on file.  Subjective: Pt asleep, able to rouse Assessment / Plan / Recommendation CHL IP CLINICAL IMPRESSIONS 02/16/2018 Clinical Impression Pt demonstrates a mild oral dysphagia and moderate pharyngeal dysphagia characterized by right labial and lingual weakness though lingual propulsion and mastication of liquids and soft soldis appear adequate with only mild residue and anterior spillage. Pharyngeal impairment includes intermittently decreased movement of the base of tongue and upper pharyngeal constrictors for bolus propulsion leading  to vallecular residuals ranging from severe to mild. Larger bolus size does appear to trigger best propulsion. A head turn left was also significantly better at reducing vallecular residuals than any other posture. Pt senses residue and sometimes his ineffective efforts to clear appear like gagging. Recommend dys 2 (fine chip) and nectar thick liquids as thin liquids did result in silent aspiration before the swallow with larger boluses. Pts cough is not effective to clear. Will follow closely for tolerance.  SLP Visit Diagnosis Dysphagia, oropharyngeal phase (R13.12) Attention and concentration  deficit following -- Frontal lobe and executive function deficit following -- Impact on safety and function --   CHL IP TREATMENT RECOMMENDATION 02/16/2018 Treatment Recommendations Therapy as outlined in treatment plan below   Prognosis 02/16/2018 Prognosis for Safe Diet Advancement Good Barriers to Reach Goals -- Barriers/Prognosis Comment -- CHL IP DIET RECOMMENDATION 02/16/2018 SLP Diet Recommendations Dysphagia 2 (Fine chop) solids;Nectar thick liquid Liquid Administration via Straw Medication Administration Crushed with puree Compensations Slow rate;Small sips/bites;Minimize environmental distractions Postural Changes (No Data)   CHL IP OTHER RECOMMENDATIONS 02/16/2018 Recommended Consults -- Oral Care Recommendations Oral care BID Other Recommendations Have oral suction available   CHL IP FOLLOW UP RECOMMENDATIONS 02/16/2018 Follow up Recommendations Inpatient Rehab   CHL IP FREQUENCY AND DURATION 02/16/2018 Speech Therapy Frequency (ACUTE ONLY) min 2x/week Treatment Duration 2 weeks      CHL IP ORAL PHASE 02/16/2018 Oral Phase Impaired Oral - Pudding Teaspoon -- Oral - Pudding Cup -- Oral - Honey Teaspoon -- Oral - Honey Cup -- Oral - Nectar Teaspoon -- Oral - Nectar Cup -- Oral - Nectar Straw Right anterior bolus loss;Weak lingual manipulation;Decreased bolus cohesion Oral - Thin Teaspoon -- Oral - Thin Cup -- Oral - Thin Straw Decreased bolus cohesion;Weak lingual manipulation;Right anterior bolus loss Oral - Puree Decreased bolus cohesion;Reduced posterior propulsion;Weak lingual manipulation;Delayed oral transit Oral - Mech Soft Decreased bolus cohesion;Reduced posterior propulsion;Weak lingual manipulation;Delayed oral transit Oral - Regular -- Oral - Multi-Consistency -- Oral - Pill -- Oral Phase - Comment --  CHL IP PHARYNGEAL PHASE 02/16/2018 Pharyngeal Phase Impaired Pharyngeal- Pudding Teaspoon -- Pharyngeal -- Pharyngeal- Pudding Cup -- Pharyngeal -- Pharyngeal- Honey Teaspoon -- Pharyngeal -- Pharyngeal-  Honey Cup NT Pharyngeal -- Pharyngeal- Nectar Teaspoon -- Pharyngeal -- Pharyngeal- Nectar Cup -- Pharyngeal -- Pharyngeal- Nectar Straw Delayed swallow initiation-pyriform sinuses;Delayed swallow initiation-vallecula;Reduced pharyngeal peristalsis;Reduced tongue base retraction;Penetration/Apiration after swallow;Trace aspiration;Pharyngeal residue - valleculae Pharyngeal Material enters airway, CONTACTS cords and not ejected out;Material does not enter airway Pharyngeal- Thin Teaspoon -- Pharyngeal -- Pharyngeal- Thin Cup -- Pharyngeal -- Pharyngeal- Thin Straw Delayed swallow initiation-pyriform sinuses;Delayed swallow initiation-vallecula;Reduced pharyngeal peristalsis;Reduced tongue base retraction;Trace aspiration;Pharyngeal residue - valleculae;Penetration/Aspiration before swallow Pharyngeal Material enters airway, passes BELOW cords without attempt by patient to eject out (silent aspiration) Pharyngeal- Puree Delayed swallow initiation-pyriform sinuses;Delayed swallow initiation-vallecula;Reduced pharyngeal peristalsis;Reduced tongue base retraction;Pharyngeal residue - valleculae Pharyngeal Material does not enter airway Pharyngeal- Mechanical Soft Delayed swallow initiation-pyriform sinuses;Delayed swallow initiation-vallecula;Reduced pharyngeal peristalsis;Reduced tongue base retraction;Pharyngeal residue - valleculae Pharyngeal -- Pharyngeal- Regular -- Pharyngeal -- Pharyngeal- Multi-consistency -- Pharyngeal -- Pharyngeal- Pill -- Pharyngeal -- Pharyngeal Comment --  No flowsheet data found. Marvin [place], MA CCC-SLP Acute Rehabilitation Services Pager [phone] Office [phone] Lynann Beaver 02/16/2018, 1:53 PM               TTE  Normal ejection fraction of 55-60% with no regional wall motion abnormalities. Cannot rule out echodense mass on septal leaflet of tricuspid valve  PHYSICAL EXAM  Temp:  [98.7 F (37.1 C)-99.5 F (37.5 C)] 99.1 F (37.3 C) (12/11 1600) Pulse  Rate:  [67-82] 72 (12/11 1700) Resp:  [11-24] 19 (12/11 1700) BP: (84-119)/(58-75) 118/71 (12/11 1700) SpO2:  [97 %-100 %] 99 % (12/11 1700) FiO2 (%):  [40 %] 40 % (12/11 1538) Weight:  [94.4 kg] 94.4 kg (12/11 0423)  General - Well nourished, well developed elderly African-American male,who is intubated and sedated Ophthalmologic - fundi not visualized due to noncooperation.  Cardiovascular - Regular rate and rhythm.  Neuro - he is sedated and intubated.he will not follow any commands. He is slightly restless and agitated.  PERRL,  Dolls eye moments are sluggish.  Does not blink to threat bilaterally..  Right facial droop, tongue midline.  Facial sensation symmetrical.    Moving all extremities symmetrically,  No focal weakness  Gait not tested.   ASSESSMENT/PLAN Marvin Mendez is a 72 y.o. male with history of hypertension, postural hypotension on Florinef, CKD, thrombocytopenia admitted for generalized weakness and lethargy started 5:30 PM 03/13/2018. No tPA given due to [place].    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 Ellene Route and he will follow up to repair AVM once pt stable  Resultant lethargy, nystagmus, right arm ataxia, respiratory distress  CT head cerebellar ICH with SDH  Repeat CT showed decreased right cerebellum and vermis ICH, stable right posterior fossa SDH MRI with and without contrast  No significant interval change in size and appearance of evolving intraparenchymal hemorrhage involving the right cerebellum and cerebellar vermis. Similar regional mass effect without.Associated small right posterior fossa and posterior right cerebral convexity subdural hematoma without significant mass effect, stable. Persistent small volume underlying subarachnoid hemorrhage. 9 mm acute ischemic nonhemorrhagic left cerebellar infarct adjacent to the cerebellar hemorrhage.Small left cerebellar  AVM, presumably the source of hemorrhage.hydrocephalus or herniation.  CTA head and neck concerning left cerebellar AVM - Dr. Ellene Route will take care of it once pt stable  2D Echo  Left ventricular ejection fraction 55-60%. No wall motion abnormalities.Reubin Milan rule out echodense mass on right cuspid valve in apical views  LDL 76  HgbA1c 5.5  SCDs for VTE prophylaxis  NPO  No antithrombotic prior to admission, now on No antithrombotic.   Ongoing aggressive stroke risk factor management  Therapy recommendations:  Pending   Disposition:  Pending   Respiratory distress  Intubated on admission  Extubated 02/15/18  Pt still has mild respiratory distress with difficulty handling secretions  On breathing treatment  Chest PT  CCM on board  Cerebral edema  Posterior fossa mass-effect  No hydrocephalus on CT so far  CT head and neck showed stable hematoma and no hydro  MRI brain 02/17/18 shows stable appearance of the cerebellar hemorrhage and mass effect. As well as small right posterior fossa and right cerebral convexity subdurals. 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 Small left cerebellar AVM  On 3% saline she was discontinued 05/18/17  Sodium 160-will wait for it to slowly come down and not use hypotonic fluids due to concerns about worsening cytotoxic edema   Sodium check every daily  Has central line placement  Hypertension . Stable  BP goal < 140  On low dose cleviprex   Consider po BP meds once pass swallow  Hyperlipidemia  Home meds:  none   LDL 76, goal < 70  No  statin at this time  Dysphagia  Did not pass swallow  MBS today  If not passing swallow, may consider cortrak    CKD  Creatinine 1.5->1.5->1.34  On IV fluid  BMP monitoring  Other Stroke Risk Factors  Advanced age  Other Active Problems  Hyperglycemia, improved  Postural hypotension on Florinef at home  Thrombocytopenia platelet 147->  138  Hypernatremia induced to control cytotoxic edema  Hospital day # 6 Patient remains intubated due to  respiratory failure and is sedated. Neurologically he remains unchanged but serum sodium remains quite high despite hypertonic saline drip being off for more than 2 days. Recommend continuet free water 200 mL every 4 hourly via NG tube.for correction of hypernatremia. D/w  Dr. Vaughan Browner critical care M.D. .Continue systolic blood pressure goal below 160   Use when necessary labetalol and hydralazine.   This patient is critically ill due to cerebellar ICH, posterior fossa SDH, cerebral edema, respiratory distress hypertensive emergency and at significant risk of neurological worsening, death form hematoma expansion, cerebral edema, brain herniation, seizure, respiratory failure. This patient's care requires constant monitoring of vital signs, hemodynamics, respiratory and cardiac monitoring, review of multiple databases, neurological assessment, discussion with family, other specialists and medical decision making of high complexity. I spent 30 minutes of neurocritical care time in the care of this patient    Antony Contras, MD Stroke Neurology 02/21/2018 5:24 PM    To contact Stroke Continuity provider, please refer to [email or web address] After hours, contact General Neurology

## 2018-02-21 NOTE — Progress Notes (Signed)
NAME:  Marvin Mendez, MRN:  [id number], DOB:  02-Jun-1945, LOS: 29 ADMISSION DATE:  02/27/2018, CONSULTATION DATE:  12/5 REFERRING MD:  Dr. Ellender Hose EDP, CHIEF COMPLAINT:  ICH   Brief History   72 year old male admitted for cerebellar ICH on 12/5. Intubated in ED for airway protection. He was then seen by Dr. Ellene Route in ED, and was deemed to not be a surgical candidate and perhaps even a futile situation. PCCM asked to admit.  Extuabted 12/5.  Reintubated on 12/9 after he developed pneumonia.   Past Medical History   HTN, CKD 3, anemia (b12 deficient), thrombocytopenia, and autonomic postural hypotension (on florinef). Family reports he is a very active man. Works out several times per week. Is very strong and independent.   Significant Hospital Events   12/5 - admitted 02/19/2018 Reintubated. Limited CODE BLUE  Consults:  Neurosurgery - 12/5 Neurology - 12/5  Procedures:  ETT 12/4 > 02/15/2018, re inubated 12/9 >>  Significant Diagnostic Tests:  CT head 12/4 > acute IPH in the central cerebellum with estimated volume 29cc. Assocaited edema and mass effect with probable early herniation. Subarachnoid extension. Possible intraventricular extension into fourth ventricle.   CT head 12/5 >>> with decreased central cerebellar hemorrhage to 15 cc.  CT Head 12/8>>> 1. Interval decrease in size of evolving acute central cerebellar hematoma. Persistent regional mass effect without obstructive hydrocephalus, Persistent SDH  Chest x-ray 12/11>> lower lobe atelectasis/infiltrate.  Cardiomegaly, mild vascular congestion I reviewed the images personally.   Micro Data:  02/19/2018 sputum >> hemophilus, entrobacter 02/19/2018 blood cultures x 2 >>  Antimicrobials:  Vanco 12/9 > 12/11 Cefepime 12/19>  Interim history/subjective:    Objective   Blood pressure 113/64, pulse 67, temperature 99.1 F (37.3 C), temperature source Axillary, resp. rate 19, height 6' (1.829 m), weight 94.4 kg, SpO2  98 %.    Vent Mode: PRVC FiO2 (%):  [40 %] 40 % Set Rate:  [18 bmp] 18 bmp Vt Set:  [620 mL] 620 mL PEEP:  [10 cmH20] 10 cmH20 Plateau Pressure:  [14 cmH20-21 cmH20] 17 cmH20   Intake/Output Summary (Last 24 hours) at 02/21/2018 1152 Last data filed at 02/21/2018 1100 Gross per 24 hour  Intake 2051.15 ml  Output 140 ml  Net 1911.15 ml   Filed Weights   02/19/18 0211 02/20/18 0500 02/21/18 0423  Weight: 91.2 kg 92.5 kg 94.4 kg    Examination: Gen:      No acute distress HEENT:  EOMI, sclera anicteric Neck:     No masses; no thyromegaly, ETT Lungs:    Clear to auscultation bilaterally; normal respiratory effort CV:         Regular rate and rhythm; no murmurs Abd:      + bowel sounds; soft, non-tender; no palpable masses, no distension Ext:    No edema; adequate peripheral perfusion Skin:      Warm and dry; no rash Neuro: alert and oriented x 3 Psych: Sedated, unresponsive  Resolved Hospital Problem list     Assessment & Plan:  Intracerebral Hemorrhage: Cerebellar bleed with subarachnoid and possibly intraventricular extension. He was reportedly on plavix at baseline. Not candidate for surgical intervention based per neurosurgery.  AMS Continue intensive care unit now intubated Neurology, neurosurgery following  Off 3% saline  Resp failure HAP, aspiration Limited CODE BLUE. Not have a tracheostomy Goals of care were discussed with family they are limited with no long-term invasive interventions.. Continue abx, stop vasopressin  CKD III:  Hypernatremia,  hyperchloremia Continue to monitor urine output and creatinine Avoid nephrotoxins Sodium and chloride continue to be high Continue free water.  Start D5W at 50 cc an hour.  Postural hypotension Monitor blood pressure Currently on Florinef  Hyperglycemia without history of DM SSI  Best practice:  Diet: Tube feeds Pain/Anxiety/Delirium sedation for tube tolerance as needed VAP protocol (if indicated): y DVT  prophylaxis: SCDs GI prophylaxis: Protonix Glucose control: SSI Mobility: Increase mobility Code Status: Currently limited code after discussion per Dr. Vaughan Browner and family about no long-term intubation or tracheostomy. Family Communication: No family at bedside 12/11 Disposition: ICU  The patient is critically ill with multiple organ system failure and requires high complexity decision making for assessment and support, frequent evaluation and titration of therapies, advanced monitoring, review of radiographic studies and interpretation of complex data.   Critical Care Time devoted to patient care services, exclusive of separately billable procedures, described in this note is 35 minutes.   Marshell Garfinkel MD [facility] Pager [phone] If no answer call: [phone] 02/21/2018, 12:05 PM

## 2018-02-21 NOTE — Evaluation (Signed)
Occupational Therapy Evaluation Patient Details Name: Marvin Mendez MRN: [id number] DOB: 11-Dec-1945 Today's Date: 02/21/2018    History of Present Illness Pt is a 72 y.o. male admitted to outside hospital for malaise, CTA showed probable herniation of tonsils, R posterior fossa subdural hematoma, L central cerebellar AVM suspected source of bleeding. CXR bibasilar atelectasis. Cerebellar ICH on 12/5. Intubated 12/5, extubated 12/6. Reintubated 12/9 after developing pneumonia.  PMH: HTN, CKD, B12 anemia, autonomic postural hypotension.    Clinical Impression   Patient intubated and cleared by RN for participation, who removed sedation for session.  PTA patient independent.  Admitted for above, limited by problem list below.  Presents with rigidity, opens eyes spontaneously and completes only spontaneous movements with UEs, postural righting reactions noted sitting EOB, pt not following commands, but reacts to sensory testing on R LE.  At this time, requires total assist for all self care and bed mobility (+2 assist for bed mobility to/from EOB only). Noted hypotensive with sitting EOB BP decreasing to 97/60 at EOB and 87/56 seated then 85/56 after returning to supine. Attempted chair position at end of session, unable to maintain due to hypotension with RN aware. Patient will benefit from continued OT services while admitted, based on performance today recommend SNF rehab at dc.  Will continue to follow and update recommendations as needed.      Follow Up Recommendations  SNF    Equipment Recommendations  Other (comment)(TBD)    Recommendations for Other Services Other (comment)(palliative consult)     Precautions / Restrictions Precautions Precautions: Fall Precaution Comments: BP <140 Restrictions Weight Bearing Restrictions: No      Mobility Bed Mobility Overal bed mobility: Needs Assistance Bed Mobility: Supine to Sit;Sit to Supine     Supine to sit: Total assist;+2 for  physical assistance Sit to supine: Total assist;+2 for physical assistance   General bed mobility comments: total assist +2 using modified helicopter transition to/from EOB with RN managing vent  Transfers                 General transfer comment: deferred due to safety    Balance Overall balance assessment: Needs assistance Sitting-balance support: Bilateral upper extremity supported Sitting balance-Leahy Scale: Poor Sitting balance - Comments: total assist to maintain sitting balance EOB, pushing towards R side and able to maintain unsupported sitting intermittently for a few seconds due to rigidity and tone  Postural control: Right lateral lean;Posterior lean                                 ADL either performed or assessed with clinical judgement   ADL Overall ADL's : Needs assistance/impaired                                       General ADL Comments: at this time, patient is NPO and requires total assist for all self care      Vision   Additional Comments: uanble to assess, maintains eyes closed majority of session; spontaneous opening to voice and righting reactions      Perception     Praxis      Pertinent Vitals/Pain Pain Assessment: Faces Faces Pain Scale: No hurt     Hand Dominance Right   Extremity/Trunk Assessment Upper Extremity Assessment Upper Extremity Assessment: RUE deficits/detail;LUE deficits/detail RUE Deficits / Details: rigidity, non  functional use, spontaneous movements only  RUE Sensation: (continue assessment ) RUE Coordination: decreased fine motor;decreased gross motor LUE Deficits / Details: rigidity, non functional use, spontaneous movement only  LUE Sensation: (continue assessment ) LUE Coordination: decreased fine motor;decreased gross motor   Lower Extremity Assessment Lower Extremity Assessment: Defer to PT evaluation   Cervical / Trunk Assessment Cervical / Trunk Assessment: Other  exceptions Cervical / Trunk Exceptions: R lateral lean with neck rotation preference to R    Communication Communication Communication: Other (comment)(intubated )   Cognition Arousal/Alertness: Lethargic Behavior During Therapy: Flat affect Overall Cognitive Status: Difficult to assess                                 General Comments: patient unable to follow commands, spontaneously responding to his name   General Comments  Patient hypotensive, 85/57 when supine after EOB activity    Exercises     Shoulder Instructions      Home Living Family/patient expects to be discharged to:: Private residence Living Arrangements: Other relatives Available Help at Discharge: Family Type of Home: House Home Access: Stairs to enter Technical brewer of Steps: 4 Entrance Stairs-Rails: None Home Layout: One level               Home Equipment: None   Additional Comments: per chart review of PT note, pt unable to provide hx       Prior Functioning/Environment Level of Independence: Independent        Comments: per PT note: pt was very active in community and exercises multiple times a week        OT Problem List: Decreased strength;Decreased range of motion;Decreased activity tolerance;Impaired balance (sitting and/or standing);Decreased coordination;Decreased cognition;Decreased safety awareness;Impaired vision/perception;Decreased knowledge of use of DME or AE;Decreased knowledge of precautions;Cardiopulmonary status limiting activity;Impaired sensation;Impaired tone;Impaired UE functional use      OT Treatment/Interventions: Self-care/ADL training;Therapeutic exercise;Neuromuscular education;Modalities;Manual therapy;Therapeutic activities;Splinting;Balance training;Patient/family education;Cognitive remediation/compensation;Visual/perceptual remediation/compensation    OT Goals(Current goals can be found in the care plan section) Acute Rehab OT Goals OT  Goal Formulation: Patient unable to participate in goal setting Time For Goal Achievement: 03/07/18 Potential to Achieve Goals: Fair  OT Frequency: Min 2X/week   Barriers to D/C:            Co-evaluation              AM-PAC OT "6 Clicks" Daily Activity     Outcome Measure Help from another person eating meals?: Total Help from another person taking care of personal grooming?: Total Help from another person toileting, which includes using toliet, bedpan, or urinal?: Total Help from another person bathing (including washing, rinsing, drying)?: Total Help from another person to put on and taking off regular upper body clothing?: Total Help from another person to put on and taking off regular lower body clothing?: Total 6 Click Score: 6   End of Session Nurse Communication: Mobility status;Other (comment)(BP)  Activity Tolerance: Patient limited by fatigue;Patient limited by lethargy Patient left: in bed;with call bell/phone within reach;with bed alarm set;with restraints reapplied  OT Visit Diagnosis: Other abnormalities of gait and mobility (R26.89);Other symptoms and signs involving cognitive function;Other symptoms and signs involving the nervous system (R29.898)                Time: 3557-3220 OT Time Calculation (min): 27 min Charges:  OT General Charges $OT Visit: 1 Visit OT Evaluation $OT Eval High  Complexity: 1 High OT Treatments $Self Care/Home Management : 8-22 mins  Delight Stare, OT Acute Rehabilitation Services Pager [phone] Office [phone]   Delight Stare 02/21/2018, 5:22 PM

## 2018-02-22 LAB — PHOSPHORUS
Phosphorus: 2.8 mg/dL (ref 2.5–4.6)
Phosphorus: 2.6 mg/dL (ref 2.5–4.6)

## 2018-02-22 LAB — CBC
HCT: 33.2 % — ABNORMAL LOW (ref 39.0–52.0)
HEMOGLOBIN: 10.2 g/dL — AB (ref 13.0–17.0)
MCH: 30.3 pg (ref 26.0–34.0)
MCHC: 30.7 g/dL (ref 30.0–36.0)
MCV: 98.5 fL (ref 80.0–100.0)
NRBC: 0.2 % (ref 0.0–0.2)
Platelets: 57 10*3/uL — ABNORMAL LOW (ref 150–400)
RBC: 3.37 MIL/uL — ABNORMAL LOW (ref 4.22–5.81)
RDW: 14.3 % (ref 11.5–15.5)
WBC: 11.7 10*3/uL — ABNORMAL HIGH (ref 4.0–10.5)

## 2018-02-22 LAB — BASIC METABOLIC PANEL
Anion gap: 8 (ref 5–15)
BUN: 51 mg/dL — ABNORMAL HIGH (ref 8–23)
CO2: 21 mmol/L — ABNORMAL LOW (ref 22–32)
CREATININE: 2.38 mg/dL — AB (ref 0.61–1.24)
Calcium: 7.8 mg/dL — ABNORMAL LOW (ref 8.9–10.3)
Chloride: 129 mmol/L — ABNORMAL HIGH (ref 98–111)
GFR calc Af Amer: 30 mL/min — ABNORMAL LOW (ref >60)
GFR calc non Af Amer: 26 mL/min — ABNORMAL LOW (ref >60)
Glucose, Bld: 146 mg/dL — ABNORMAL HIGH (ref 70–99)
Potassium: 3.5 mmol/L (ref 3.5–5.1)
Sodium: 158 mmol/L — ABNORMAL HIGH (ref 135–145)

## 2018-02-22 LAB — GLUCOSE, CAPILLARY
Glucose-Capillary: 109 mg/dL — ABNORMAL HIGH (ref 70–99)
Glucose-Capillary: 132 mg/dL — ABNORMAL HIGH (ref 70–99)
Glucose-Capillary: 106 mg/dL — ABNORMAL HIGH (ref 70–99)
Glucose-Capillary: 111 mg/dL — ABNORMAL HIGH (ref 70–99)

## 2018-02-22 LAB — CULTURE, RESPIRATORY W GRAM STAIN

## 2018-02-22 LAB — MAGNESIUM
MAGNESIUM: 2.1 mg/dL (ref 1.7–2.4)
Magnesium: 2.2 mg/dL (ref 1.7–2.4)

## 2018-02-22 LAB — CULTURE, RESPIRATORY

## 2018-02-22 MED ORDER — SODIUM CHLORIDE 0.9 % IV SOLN
1.0000 g | INTRAVENOUS | Status: AC
  Administered 2018-02-22: 1 g via INTRAVENOUS
  Filled 2018-02-22: qty 1

## 2018-02-22 MED ORDER — SENNOSIDES-DOCUSATE SODIUM 8.6-50 MG PO TABS
1.0000 | ORAL_TABLET | Freq: Two times a day (BID) | ORAL | Status: DC
  Administered 2018-02-22 – 2018-02-24 (×6): 1 via ORAL
  Filled 2018-02-22 (×7): qty 1

## 2018-02-22 MED ORDER — SODIUM CHLORIDE 0.9 % IV SOLN
2.0000 g | INTRAVENOUS | Status: DC
  Administered 2018-02-23 – 2018-02-24 (×2): 2 g via INTRAVENOUS
  Filled 2018-02-22 (×2): qty 2

## 2018-02-22 NOTE — Progress Notes (Signed)
Physical Therapy Treatment Patient Details Name: Marvin Mendez MRN: [id number] DOB: April 09, 1945 Today's Date: 02/22/2018    History of Present Illness Pt is a 72 y.o. male admitted to outside hospital for malaise, CTA showed probable herniation of tonsils, R posterior fossa subdural hematoma, L central cerebellar AVM suspected source of bleeding. CXR bibasilar atelectasis. Cerebellar ICH on 12/5. Intubated 12/5, extubated 12/6. Reintubated 12/9 after developing pneumonia.  PMH: HTN, CKD, B12 anemia, autonomic postural hypotension.     PT Comments    Patient presents with decline in arousal, intubated and with decreased responsiveness, unable to sit without max A and needing +2 total A for bed mobility.  Feel his current status is more appropriate for SNF level of rehab when stable for d/c.  PT goals downgraded to reflect change in status.  Will continue to follow acutely.    Follow Up Recommendations  SNF     Equipment Recommendations  None recommended by PT    Recommendations for Other Services       Precautions / Restrictions Precautions Precautions: Fall    Mobility  Bed Mobility Overal bed mobility: Needs Assistance Bed Mobility: Rolling;Sidelying to Sit Rolling: Max assist;+2 for physical assistance Sidelying to sit: Total assist;+2 for physical assistance   Sit to supine: Total assist;+2 for physical assistance   General bed mobility comments: pt did grasp my hand when I assisted him to roll, assist for legs off bed then +2 to lift rigid trunk upright; to supine assist for trunk and legs  Transfers                 General transfer comment: unable to attempt  Ambulation/Gait                 Stairs             Wheelchair Mobility    Modified Rankin (Stroke Patients Only) Modified Rankin (Stroke Patients Only) Pre-Morbid Rankin Score: No symptoms Modified Rankin: Severe disability     Balance Overall balance assessment: Needs  assistance Sitting-balance support: Feet supported;No upper extremity supported Sitting balance-Leahy Scale: Zero Sitting balance - Comments: sitting balance with max A throughout, when unsupported falling forward or to R side; not attempting to support himself or prevent anterior LOB                                    Cognition Arousal/Alertness: Lethargic Behavior During Therapy: Flat affect Overall Cognitive Status: Difficult to assess                                 General Comments: not following one step commands, not opening eyes during session      Exercises      General Comments General comments (skin integrity, edema, etc.): BP not taken, but pt less responsive sitting than in supine (had grabbed a hand when rolling, but not following any commands in sitting)  Sat EOB about 3-4 minutes      Pertinent Vitals/Pain Pain Assessment: Faces Faces Pain Scale: Hurts little more Pain Location: generalized Pain Descriptors / Indicators: Grimacing Pain Intervention(s): Monitored during session;Repositioned;Limited activity within patient's tolerance    Home Living                      Prior Function  PT Goals (current goals can now be found in the care plan section) Progress towards PT goals: Not progressing toward goals - comment;Goals downgraded-see care plan    Frequency    Min 4X/week      PT Plan Discharge plan needs to be updated    Co-evaluation              AM-PAC PT "6 Clicks" Mobility   Outcome Measure  Help needed turning from your back to your side while in a flat bed without using bedrails?: Total Help needed moving from lying on your back to sitting on the side of a flat bed without using bedrails?: Total Help needed moving to and from a bed to a chair (including a wheelchair)?: Total Help needed standing up from a chair using your arms (e.g., wheelchair or bedside chair)?: Total Help needed to  walk in hospital room?: Total Help needed climbing 3-5 steps with a railing? : Total 6 Click Score: 6    End of Session   Activity Tolerance: Patient limited by lethargy;Other (comment) Patient left: in bed;with bed alarm set;with family/visitor present;with call bell/phone within reach   PT Visit Diagnosis: Other abnormalities of gait and mobility (R26.89);Other symptoms and signs involving the nervous system (R29.898)     Time: 4008-6761 PT Time Calculation (min) (ACUTE ONLY): 19 min  Charges:  $Therapeutic Activity: 8-22 mins                     Magda Kiel, [place] [facility] [phone] 02/22/2018    Reginia Naas 02/22/2018, 2:53 PM

## 2018-02-22 NOTE — Progress Notes (Signed)
NAME:  Marvin Mendez, MRN:  [id number], DOB:  1946-01-12, LOS: 25 ADMISSION DATE:  02/22/2018, CONSULTATION DATE:  12/5 REFERRING MD:  Dr. Ellender Hose EDP, CHIEF COMPLAINT:  ICH   Brief History   72 year old male admitted for cerebellar ICH on 12/5. Intubated in ED for airway protection. He was then seen by Dr. Ellene Route in ED, and was deemed to not be a surgical candidate and perhaps even a futile situation. PCCM asked to admit.  Extuabted 12/5.  Reintubated on 12/9 due to poor airway protection and aspiration   Past Medical History   HTN, CKD 3, anemia (b12 deficient), thrombocytopenia, and autonomic postural hypotension (on florinef). Family reports he is a very active man. Works out several times per week. Is very strong and independent.   Significant Hospital Events   12/5 - admitted 02/19/2018 Reintubated. Limited CODE BLUE  Consults:  Neurosurgery - 12/5 Neurology - 12/5  Procedures:  ETT 12/4 > 12/5, 12/9 >>  Significant Diagnostic Tests:  CT head 12/4 > acute IPH in the central cerebellum with estimated volume 29cc. Assocaited edema and mass effect with probable early herniation. Subarachnoid extension. Possible intraventricular extension into fourth ventricle.   CT head 12/5 >>> with decreased central cerebellar hemorrhage to 15 cc.  CT Head 12/8>>> 1. Interval decrease in size of evolving acute central cerebellar hematoma. Persistent regional mass effect without obstructive hydrocephalus, Persistent SDH  Chest x-ray 12/11>> lower lobe atelectasis/infiltrate.  Cardiomegaly, mild vascular congestion I reviewed the images personally.  Micro Data:  02/19/2018 sputum >> hemophilus, entrobacter 02/19/2018 blood cultures x 2 >>  Antimicrobials:  Vanco 12/9 > 12/11 Cefepime 12/19>  Interim history/subjective:    Objective   Blood pressure 123/76, pulse 83, temperature 99 F (37.2 C), temperature source Axillary, resp. rate 15, height 6' (1.829 m), weight 94 kg, SpO2 100 %.   Vent Mode: PSV;CPAP FiO2 (%):  [40 %] 40 % Set Rate:  [18 bmp] 18 bmp Vt Set:  [620 mL] 620 mL PEEP:  [5 cmH20-10 cmH20] 5 cmH20 Pressure Support:  [5 cmH20] 5 cmH20 Plateau Pressure:  [15 cmH20-21 cmH20] 18 cmH20   Intake/Output Summary (Last 24 hours) at 02/22/2018 1113 Last data filed at 02/22/2018 1000 Gross per 24 hour  Intake 4281.8 ml  Output 675 ml  Net 3606.8 ml   Filed Weights   02/20/18 0500 02/21/18 0423 02/22/18 0500  Weight: 92.5 kg 94.4 kg 94 kg    Examination: Gen:      No acute distress HEENT:  EOMI, sclera anicteric, ETT Neck:     No masses; no thyromegaly Lungs:    Clear to auscultation bilaterally; normal respiratory effort CV:         Regular rate and rhythm; no murmurs Abd:      + bowel sounds; soft, non-tender; no palpable masses, no distension Ext:    No edema; adequate peripheral perfusion Skin:      Warm and dry; no rash Neuro: [facility] Problem list     Assessment & Plan:  Intracerebral Hemorrhage: Cerebellar bleed with subarachnoid and possibly intraventricular extension. He was reportedly on plavix at baseline. Not candidate for surgical intervention based per neurosurgery.  AMS Continue intensive care unit now intubated Neurology, neurosurgery following  Off 3% saline  Resp failure HAP, aspiration Limited CODE BLUE. No tracheostomy Goals of care were discussed with family they are limited with no long-term invasive interventions.. Continue abx for haemophilus, Enterobacter Plan is to optimize him and one-way  extubation in the next few days.  CKD III:  Hypernatremia, hyperchloremia Continue to monitor urine output and creatinine Avoid nephrotoxins Continue free water.  D5W at 50 cc an hour Will aim for slow correction of sodium.  Discussed with neurology.  Postural hypotension Monitor blood pressure Continue Florinef.  Hyperglycemia without history of DM SSI  Thrombocytopenia. Likely from critical  illness Monitor CBC  Best practice:  Diet: Tube feeds Pain/Anxiety/Delirium sedation for tube tolerance as needed VAP protocol (if indicated): y DVT prophylaxis: SCDs GI prophylaxis: Protonix Glucose control: SSI Mobility: Bed Code Status: Currently limited code after discussion per Dr. Vaughan Browner and family about no long-term intubation or tracheostomy. Family Communication: Family updated daily. Disposition: ICU  The patient is critically ill with multiple organ system failure and requires high complexity decision making for assessment and support, frequent evaluation and titration of therapies, advanced monitoring, review of radiographic studies and interpretation of complex data.   Critical Care Time devoted to patient care services, exclusive of separately billable procedures, described in this note is 35 minutes.   Marshell Garfinkel MD [facility] [facility] Pager [phone] If no answer or after 3pm call: [phone] 02/22/2018, 11:20 AM

## 2018-02-22 NOTE — Progress Notes (Signed)
Pharmacy Antibiotic Note  Marvin Mendez is a 72 y.o. male admitted on 02/27/2018 with pneumonia - enterobacter and Hflu growing in TA.  Pharmacy has been consulted for Cefepime dosing - day #4. SCr improved to 2.38.  Plan: Increase cefepime to 2g IV q24h for improving renal function Monitor clinical progress, c/s, renal function F/u de-escalation plan/LOT  Height: 6' (182.9 cm) Weight: 207 lb 3.7 oz (94 kg) IBW/kg (Calculated) : 77.6  Temp (24hrs), Avg:98.9 F (37.2 C), Min:98.4 F (36.9 C), Max:99.1 F (37.3 C)  Recent Labs  Lab 02/17/18 0523 02/19/18 1009 02/20/18 0500 02/21/18 0649 02/22/18 0449  WBC 12.0* 8.7 9.6 10.4 11.7*  CREATININE 1.62* 1.61* 2.21* 2.50* 2.38*  LATICACIDVEN  --  1.0  --   --   --     Estimated Creatinine Clearance: 32.4 mL/min (A) (by C-G formula based on SCr of 2.38 mg/dL (H)).    Allergies  Allergen Reactions  . Midodrine   . Aspirin Hives  . Codeine Hives    Antimicrobials this admission: Cefepime 12/9 >> Vanc 12/9 >>12/11  12/10 UC - neg 12/9 TA - abundant Hflu (beta lactamase +), enterobacter (S-cefepime) 12/9 bcx - ngtd  Elicia Lamp, PharmD, BCPS Clinical Pharmacist Clinical phone [phone] Please check AMION for all [place] contact numbers 02/22/2018 11:18 AM

## 2018-02-22 NOTE — Progress Notes (Signed)
STROKE TEAM PROGRESS NOTE   SUBJECTIVE (INTERVAL HISTORY) His RN is at the bedside.    He remains intubated and  sedated.  Serum sodium is down to 158 and hypertonic saline has been discontinued and free water has been started Blood pressure has been adequately controlled blood cultures are negative so far but sputum grew Enterobacter  and Haemophilus  OBJECTIVE Temp:  [98.4 F (36.9 C)-99.3 F (37.4 C)] 98.9 F (37.2 C) (12/12 1600) Pulse Rate:  [62-83] 72 (12/12 1615) Cardiac Rhythm: Normal sinus rhythm (12/12 0800) Resp:  [10-28] 19 (12/12 1615) BP: (83-137)/(57-78) 110/68 (12/12 1615) SpO2:  [92 %-100 %] 98 % (12/12 1616) FiO2 (%):  [40 %] 40 % (12/12 1616) Weight:  [94 kg] 94 kg (12/12 0500)  Recent Labs  Lab 02/21/18 2017 02/21/18 2350 02/22/18 0402 02/22/18 1219 02/22/18 1614  GLUCAP 130* 136* 132* 106* 111*   Recent Labs  Lab 02/17/18 0523  02/19/18 0525 02/19/18 1009 02/20/18 0500 02/21/18 0649 02/21/18 1700 02/22/18 0449  NA 156*   < > 160* 159* 161* 159*  --  158*  K 4.2  --   --  3.2* 3.7 3.7  --  3.5  CL 128*  --   --  129* >130* >130*  --  129*  CO2 20*  --   --  22 20* 20*  --  21*  GLUCOSE 131*  --   --  128* 122* 147*  --  146*  BUN 14  --   --  23 37* 50*  --  51*  CREATININE 1.62*  --   --  1.61* 2.21* 2.50*  --  2.38*  CALCIUM 8.8*  --   --  8.7* 8.8* 8.5*  --  7.8*  MG 2.0  --   --   --  2.0 2.1 2.0 2.1  PHOS 2.1*  --   --   --  2.0* 1.3* 3.1 2.8   < > = values in this interval not displayed.   Recent Labs  Lab 02/19/18 1009  AST 48*  ALT 27  ALKPHOS 52  BILITOT 1.3*  PROT 6.9  ALBUMIN 2.8*   Recent Labs  Lab 02/17/18 0523 02/19/18 1009 02/20/18 0500 02/21/18 0649 02/22/18 0449  WBC 12.0* 8.7 9.6 10.4 11.7*  NEUTROABS  --  6.8  --  7.8*  --   HGB 11.4* 12.0* 11.7* 10.7* 10.2*  HCT 36.0* 37.8* 38.4* 35.2* 33.2*  MCV 97.8 95.2 97.7 98.9 98.5  PLT 95* 84* 76* 61* 57*   No results for input(s): CKTOTAL, CKMB, CKMBINDEX, TROPONINI  in the last 168 hours. No results for input(s): LABPROT, INR in the last 72 hours. Recent Labs    02/20/18 1103  COLORURINE YELLOW  LABSPEC 1.020  PHURINE 5.0  GLUCOSEU NEGATIVE  HGBUR MODERATE*  BILIRUBINUR NEGATIVE  KETONESUR 5*  PROTEINUR 30*  NITRITE NEGATIVE  LEUKOCYTESUR NEGATIVE       Component Value Date/Time   CHOL 134 02/16/2018 0408   TRIG 74 02/16/2018 0408   HDL 43 02/16/2018 0408   CHOLHDL 3.1 02/16/2018 0408   VLDL 15 02/16/2018 0408   LDLCALC 76 02/16/2018 0408   Lab Results  Component Value Date   HGBA1C 5.5 02/11/2018   No results found for: LABOPIA, COCAINSCRNUR, LABBENZ, AMPHETMU, THCU, LABBARB  No results for input(s): ETH in the last 168 hours.  I have personally reviewed the radiological images below and agree with the radiology interpretations.  Ct Angio Head W Or Wo  Contrast  Result Date: 02/15/2018 CLINICAL DATA:  72 y/o  M; follow-up of intracranial hemorrhage. EXAM: CT ANGIOGRAPHY HEAD AND NECK TECHNIQUE: Multidetector CT imaging of the head and neck was performed using the standard protocol during bolus administration of intravenous contrast. Multiplanar CT image reconstructions and MIPs were obtained to evaluate the vascular anatomy. Carotid stenosis measurements (when applicable) are obtained utilizing NASCET criteria, using the distal internal carotid diameter as the denominator. CONTRAST:  8mL ISOVUE-370 IOPAMIDOL (ISOVUE-370) INJECTION 76% COMPARISON:  03/03/2018 CT head. FINDINGS: CTA NECK FINDINGS Aortic arch: Standard branching. Imaged portion shows no evidence of aneurysm or dissection. No significant stenosis of the major arch vessel origins. Right carotid system: No evidence of dissection, stenosis (50% or greater) or occlusion. Left carotid system: No evidence of dissection, stenosis (50% or greater) or occlusion. Vertebral arteries: Codominant. No evidence of dissection, stenosis (50% or greater) or occlusion. Skeleton: Mild-to-moderate  cervical spondylosis with multilevel disc and facet degenerative changes. No high-grade bony canal stenosis. Other neck: Negative. Upper chest: Negative. Review of the MIP images confirms the above findings CTA HEAD FINDINGS Anterior circulation: No significant stenosis, proximal occlusion, aneurysm, or vascular malformation. Posterior circulation: Mild stenosis of the left vertebral artery at the left PICA origin. The left PICA asymmetrically enlarged with a branch extending to a cluster of vessels within the posteromedial aspect of the left cerebellar hemisphere spanning approximately 11 mm (series 12, image [id number] and series 13, image 105, also see sagittal MIPS). The right vertebral, basilar, and bilateral posterior cerebral arteries are unremarkable. Venous sinuses: Hyperdense prominent venous structure within the medial cerebellum, possibly a draining vein from the left cerebellar vascular malformation (series 12, image 22). Anatomic variants: None significant. Delayed phase: No abnormal intracranial enhancement. Review of the MIP images confirms the above findings IMPRESSION: CTA head: 1. Small group of abnormal vessels within the posteromedial aspect of left cerebellar hemisphere spanning approximately 11 mm, suspected arteriovenous malformation. 2. Mild stenosis of left vertebral artery and PICA origin. 3. No additional aneurysm, stenosis, vascular malformation, or occlusion of the anterior and posterior circulation. CTA neck: Patent carotid and vertebral arteries of the neck. No dissection, aneurysm, or hemodynamically significant stenosis by NASCET criteria. These results will be called to the ordering clinician or representative by the Radiologist Assistant, and communication documented in the PACS or zVision Dashboard. Electronically Signed   By: Kristine Garbe M.D.   On: 02/15/2018 19:17   Ct Head Wo Contrast  Result Date: 02/18/2018 CLINICAL DATA:  Follow-up examination for intracranial  hemorrhage. EXAM: CT HEAD WITHOUT CONTRAST TECHNIQUE: Contiguous axial images were obtained from the base of the skull through the vertex without intravenous contrast. COMPARISON:  Prior CT from 02/12/2018. FINDINGS: Brain: Involving central cerebellar hemorrhage decreased in size now measuring 2.5 x 2.7 x 2.5 cm (8.4 cc), previously 2.8 x 3.3 x 3.3 cm (16 cc). Subdural extension with small right parafalcine and tentorial hematoma again noted, similar. Subdural hematoma at the right posterior fossa little interval changed measuring up to 4-5 mm. Persistent localized mass effect with effacement of the suprasellar cistern. Mass effect on the adjacent fourth ventricle which remains partially effaced, relatively similar. No hydrocephalus. Trace subarachnoid hemorrhage within the right temporal occipital region persist. No other new acute intracranial abnormality. No acute large vessel territory infarct. Vascular: No hyperdense vessel. Skull: Scalp soft tissues and calvarium demonstrate no acute finding. Sinuses/Orbits: Globes and orbital soft tissues within normal limits. Scattered mucosal thickening throughout the ethmoidal air cells. Paranasal sinuses are otherwise grossly  clear. No mastoid effusion. Other: None. IMPRESSION: 1. Interval decrease in size of evolving acute central cerebellar hematoma (8 cc, previously 16 cc). Persistent regional mass effect without obstructive hydrocephalus. 2. Persistent small subdural hematoma overlying the right posterior fossa and along the falx and tentorium without mass effect, similar to previous. Persistent trace subarachnoid hemorrhage. Electronically Signed   By: Jeannine Boga M.D.   On: 02/18/2018 03:44   Ct Head Wo Contrast  Result Date: 02/15/2018 CLINICAL DATA:  Altered mental status. Follow-up intracranial hemorrhage. EXAM: CT HEAD WITHOUT CONTRAST TECHNIQUE: Contiguous axial images were obtained from the base of the skull through the vertex without intravenous  contrast. COMPARISON:  CT HEAD February 14, 2018 at 1815 hours FINDINGS: BRAIN: Central cerebellar 2.8 x 3.3 x 3.3 cm (volume = 16 cc) intraparenchymal hematoma was 30 cc. Subdural extension with decreased 2-3 mm falcotentorial subdural hematoma. RIGHT posterior fossa subdural hematoma measuring to 4 mm tracking into the included cervical spine. Effaced supra cerebellar cistern. Regional mass effect resulting in narrowed fourth ventricle without hydrocephalus. Trace subarachnoid hemorrhage. No acute large vascular territory infarct. VASCULAR: Trace calcific atherosclerosis of the carotid siphons. SKULL: No skull fracture. No significant scalp soft tissue swelling. SINUSES/ORBITS: Mild paranasal sinus mucosal thickening. Mastoid air cells are well aerated.The included ocular globes and orbital contents are non-suspicious. OTHER: None. IMPRESSION: 1. Evolving acute central cerebellar hematoma (16 cc, previously 30 cc). Regional mass effect without obstructive hydrocephalus. 2. Decreased falcotentorial and RIGHT posterior fossa subdural hematomas extending into included cervical spine. Small volume subarachnoid hemorrhage. Electronically Signed   By: Elon Alas M.D.   On: 02/15/2018 01:16   Ct Angio Neck W Or Wo Contrast  Result Date: 02/15/2018 CLINICAL DATA:  72 y/o  M; follow-up of intracranial hemorrhage. EXAM: CT ANGIOGRAPHY HEAD AND NECK TECHNIQUE: Multidetector CT imaging of the head and neck was performed using the standard protocol during bolus administration of intravenous contrast. Multiplanar CT image reconstructions and MIPs were obtained to evaluate the vascular anatomy. Carotid stenosis measurements (when applicable) are obtained utilizing NASCET criteria, using the distal internal carotid diameter as the denominator. CONTRAST:  37mL ISOVUE-370 IOPAMIDOL (ISOVUE-370) INJECTION 76% COMPARISON:  02/16/2018 CT head. FINDINGS: CTA NECK FINDINGS Aortic arch: Standard branching. Imaged portion shows  no evidence of aneurysm or dissection. No significant stenosis of the major arch vessel origins. Right carotid system: No evidence of dissection, stenosis (50% or greater) or occlusion. Left carotid system: No evidence of dissection, stenosis (50% or greater) or occlusion. Vertebral arteries: Codominant. No evidence of dissection, stenosis (50% or greater) or occlusion. Skeleton: Mild-to-moderate cervical spondylosis with multilevel disc and facet degenerative changes. No high-grade bony canal stenosis. Other neck: Negative. Upper chest: Negative. Review of the MIP images confirms the above findings CTA HEAD FINDINGS Anterior circulation: No significant stenosis, proximal occlusion, aneurysm, or vascular malformation. Posterior circulation: Mild stenosis of the left vertebral artery at the left PICA origin. The left PICA asymmetrically enlarged with a branch extending to a cluster of vessels within the posteromedial aspect of the left cerebellar hemisphere spanning approximately 11 mm (series 12, image 157-158 and series 13, image 105, also see sagittal MIPS). The right vertebral, basilar, and bilateral posterior cerebral arteries are unremarkable. Venous sinuses: Hyperdense prominent venous structure within the medial cerebellum, possibly a draining vein from the left cerebellar vascular malformation (series 12, image 22). Anatomic variants: None significant. Delayed phase: No abnormal intracranial enhancement. Review of the MIP images confirms the above findings IMPRESSION: CTA head: 1. Small group  of abnormal vessels within the posteromedial aspect of left cerebellar hemisphere spanning approximately 11 mm, suspected arteriovenous malformation. 2. Mild stenosis of left vertebral artery and PICA origin. 3. No additional aneurysm, stenosis, vascular malformation, or occlusion of the anterior and posterior circulation. CTA neck: Patent carotid and vertebral arteries of the neck. No dissection, aneurysm, or  hemodynamically significant stenosis by NASCET criteria. These results will be called to the ordering clinician or representative by the Radiologist Assistant, and communication documented in the PACS or zVision Dashboard. Electronically Signed   By: Kristine Garbe M.D.   On: 02/15/2018 19:17   Mr Jeri Cos XN Contrast  Result Date: 02/17/2018 CLINICAL DATA:  Follow-up exam for intracranial hemorrhage. EXAM: MRI HEAD WITHOUT AND WITH CONTRAST TECHNIQUE: Multiplanar, multiecho pulse sequences of the brain and surrounding structures were obtained without and with intravenous contrast. CONTRAST:  90 cc of Gadavist. COMPARISON:  Comparison made with prior CTA from 02/15/2018 as well as previous CTs from 02/26/2018. FINDINGS: Brain: Intraparenchymal hemorrhage involving the cerebellar vermis and right cerebellar hemisphere again seen, relatively stable in size measuring approximately 3.6 x 2.9 x 3.2 cm. Localized edema with regional mass effect similar as well. Partial effacement of the fourth ventricle and fourth ventricular outflow tract anteriorly which remain patent. Ventricular size is stable without hydrocephalus or ventricular trapping. Slight asymmetry of the lateral ventricles with the left larger than the right noted, likely congenital. Mild mass effect on the brainstem anteriorly without frank compression. No transtentorial herniation. Probable enhancing tangle of vessels at the adjacent posterior left cerebellum suspicious for AVM (series 18, image 16, also seen on prior CTA. No other abnormal enhancement seen underlying the hematoma. Right posterior fossa of subdural hematoma relatively stable measuring up to 4 mm in thickness. Additional small subdural collection overlying the posterior right cerebral convexity measures up to 2 mm without mass effect, likely small volume subdural hemorrhage and/or reactive hygroma. Scattered small volume subarachnoid hemorrhage present within the underlying  right temporal occipital region (series 13, image 30). Smooth dural enhancement overlying the right cerebral convexity likely reactive. 9 mm acute ischemic left cerebellar infarct seen adjacent to the hematoma (series 5, image 58). No other evidence for acute ischemia. Underlying cerebral volume normal. Mild chronic microvascular ischemic changes noted within the periventricular white matter. No other areas of chronic infarction. No mass lesion or abnormal enhancement elsewhere within the brain. Vascular: Major intracranial vascular flow voids maintained left cerebellar AVM measures approximately 13 x 18 mm (series 12, image 7). Skull and upper cervical spine: Craniocervical junction within normal limits. Upper cervical spine normal. Bone marrow signal intensity normal. No scalp soft tissue abnormality. Sinuses/Orbits: Globes and orbital soft tissues within normal limits. Mild scattered mucosal thickening throughout the ethmoidal air cells. Paranasal sinuses are otherwise clear. Trace right mastoid effusion noted, of doubtful significance. Inner ear structures normal. Other: None. IMPRESSION: 1. No significant interval change in size and appearance of evolving intraparenchymal hemorrhage involving the right cerebellum and cerebellar vermis. Similar regional mass effect without hydrocephalus or herniation. 2. Associated small right posterior fossa and posterior right cerebral convexity subdural hematoma without significant mass effect, stable. Persistent small volume underlying subarachnoid hemorrhage. 3. 9 mm acute ischemic nonhemorrhagic left cerebellar infarct adjacent to the cerebellar hemorrhage. 4. Small left cerebellar AVM, presumably the source of hemorrhage. Electronically Signed   By: Jeannine Boga M.D.   On: 02/17/2018 03:39   Dg Chest Port 1 View  Result Date: 02/21/2018 CLINICAL DATA:  ET tube EXAM: PORTABLE CHEST  1 VIEW COMPARISON:  02/20/2018 FINDINGS: Support devices are stable. Low lung  volumes. Mild cardiomegaly and vascular congestion. Left lower lobe atelectasis or infiltrate. No effusions or acute bony abnormality. IMPRESSION: Low lung volumes. Left lower lobe atelectasis or infiltrate. Cardiomegaly, vascular congestion. Electronically Signed   By: Rolm Baptise M.D.   On: 02/21/2018 09:30   Dg Chest Port 1 View  Result Date: 02/20/2018 CLINICAL DATA:  Acute respiratory failure, intubation EXAM: PORTABLE CHEST 1 VIEW COMPARISON:  Portable exam 0555 hours compared to 02/19/2018 FINDINGS: Tip of endotracheal tube projects 4.0 cm above carina. Nasogastric tube coiled in proximal stomach. LEFT jugular central venous catheter with tip projecting over SVC. Normal heart size and mediastinal contours. Elevation of LEFT diaphragm with bibasilar atelectasis. Peribronchial thickening. No acute infiltrate, pleural effusion or pneumothorax. IMPRESSION: Bibasilar atelectasis. Electronically Signed   By: Lavonia Dana M.D.   On: 02/20/2018 09:03   Dg Chest Port 1 View  Result Date: 02/19/2018 CLINICAL DATA:  Intubation. Acute respiratory insufficiency. EXAM: PORTABLE CHEST 1 VIEW 12:58 p.m. COMPARISON:  Chest x-rays dated 02/19/2018 at 5:05 a.m. and 02/17/2018 FINDINGS: Endotracheal tube tip is at the level of the thoracic inlet 6.8 cm above the carina. Central venous catheter tip is in the superior vena cava at the level of the azygos vein. NG tube tip is in the fundus of the stomach. There is chronic elevation of the left hemidiaphragm. Heart size and vascularity are normal. No infiltrates or effusions. No acute bone abnormality. IMPRESSION: Endotracheal tube and central line appear in good position. No acute cardiopulmonary findings. Electronically Signed   By: Lorriane Shire M.D.   On: 02/19/2018 13:15   Dg Chest Portable 1 View  Result Date: 02/19/2018 CLINICAL DATA:  Aspiration, shortness of breath EXAM: PORTABLE CHEST 1 VIEW COMPARISON:  02/17/2018 FINDINGS: Left central line remains in  place, unchanged. Mild cardiomegaly. Elevation of the left hemidiaphragm with left base atelectasis or infiltrate. No confluent opacity on the right. Mild vascular congestion. No acute bony abnormality. IMPRESSION: Left base atelectasis or infiltrate, similar to prior study. Cardiomegaly, vascular congestion. Electronically Signed   By: Rolm Baptise M.D.   On: 02/19/2018 07:37   Dg Chest Port 1 View  Result Date: 02/17/2018 CLINICAL DATA:  Respiratory failure EXAM: PORTABLE CHEST 1 VIEW COMPARISON:  1 day prior FINDINGS: Left internal jugular line tip at mid SVC. Cardiomegaly accentuated by AP portable technique. Possible small left pleural effusion. No pneumothorax. Low lung volumes with resultant pulmonary interstitial prominence. Left lower lobe airspace disease is similar. Right perihilar airspace disease is slightly increased. IMPRESSION: Bilateral airspace disease and progressive, similar on the left on the right. Infection and/or alveolar pulmonary edema. Electronically Signed   By: Abigail Miyamoto M.D.   On: 02/17/2018 08:56   Dg Chest Port 1 View  Result Date: 02/16/2018 CLINICAL DATA:  Respiratory failure EXAM: PORTABLE CHEST 1 VIEW COMPARISON:  02/15/2018 FINDINGS: Left jugular central line is again identified and stable. Cardiac shadow is stable. Increasing left basilar infiltrate with likely small effusion is noted. No pneumothorax is seen. Mild right basilar atelectasis is seen. No bony abnormality is noted. IMPRESSION: Increasing left basilar infiltrate with small effusion. Increasing right basilar atelectasis. Electronically Signed   By: Inez Catalina M.D.   On: 02/16/2018 07:34   Dg Chest Port 1 View  Result Date: 02/15/2018 CLINICAL DATA:  Central line placement. EXAM: PORTABLE CHEST 1 VIEW COMPARISON:  Tool 07/2017 FINDINGS: Left internal jugular approach central venous catheter terminates in  the expected location of the proximal superior vena cava. The patient has been extubated. Enlarged  cardiac silhouette.  Left lower lobe airspace consolidation No evidence of pneumothorax. Elevation of the left hemidiaphragm with airspace consolidation versus atelectasis in the left lower lobe. Left pleural effusion is not excluded. Osseous structures are without acute abnormality. Soft tissues are grossly normal. IMPRESSION: Status post placement of left internal jugular approach central venous catheter with tip in the expected location of the proximal superior vena cava. No evidence of pneumothorax. Electronically Signed   By: Fidela Salisbury M.D.   On: 02/15/2018 17:06   Dg Chest Port 1 View  Result Date: 02/15/2018 CLINICAL DATA:  72 year old male status post intubation. EXAM: PORTABLE CHEST 1 VIEW COMPARISON:  Earlier radiograph dated 02/22/2018 FINDINGS: Endotracheal tube above the carina in similar position. Interval placement of an enteric tube which appears to extend below the diaphragm with tip in the left upper quadrant likely in the gastric fundus. There is shallow inspiration with bibasilar atelectasis. Mild eventration of the left hemidiaphragm. No focal consolidation, pleural effusion, or pneumothorax. Stable cardiac silhouette. No acute osseous pathology. IMPRESSION: Interval placement of an enteric tube with tip likely in the gastric fundus. No other interval change. Electronically Signed   By: Anner Crete M.D.   On: 02/15/2018 01:18   Dg Abd Portable 1v  Result Date: 02/19/2018 CLINICAL DATA:  OG tube placement. EXAM: PORTABLE ABDOMEN - 1 VIEW COMPARISON:  None FINDINGS: OG tube tip is in the fundus of the stomach. Bowel gas pattern is normal. Increased density in the right side of the abdomen may be due to an enlarged right lobe of the liver. Lumbar chronic elevation of the left hemidiaphragm. Scoliosis. IMPRESSION: OG tube tip in good position in the fundus of the stomach. Possible enlargement of the right lobe of the liver. Electronically Signed   By: Lorriane Shire M.D.   On:  02/19/2018 13:16   Dg Swallowing Func-speech Pathology  Result Date: 02/16/2018 Objective Swallowing Evaluation: Type of Study: MBS-Modified Barium Swallow Study  Patient Details Name: MACDONALD RIGOR MRN: [id number] Date of Birth: 15-Apr-1945 Today's Date: 02/16/2018 Time: SLP Start Time (ACUTE ONLY): 1210 -SLP Stop Time (ACUTE ONLY): 1235 SLP Time Calculation (min) (ACUTE ONLY): 25 min Past Medical History: Past Medical History: Diagnosis Date . Anemia  . Hypertension  . Renal disorder  Past Surgical History: No past surgical history on file.  Subjective: Pt asleep, able to rouse Assessment / Plan / Recommendation CHL IP CLINICAL IMPRESSIONS 02/16/2018 Clinical Impression Pt demonstrates a mild oral dysphagia and moderate pharyngeal dysphagia characterized by right labial and lingual weakness though lingual propulsion and mastication of liquids and soft soldis appear adequate with only mild residue and anterior spillage. Pharyngeal impairment includes intermittently decreased movement of the base of tongue and upper pharyngeal constrictors for bolus propulsion leading to vallecular residuals ranging from severe to mild. Larger bolus size does appear to trigger best propulsion. A head turn left was also significantly better at reducing vallecular residuals than any other posture. Pt senses residue and sometimes his ineffective efforts to clear appear like gagging. Recommend dys 2 (fine chip) and nectar thick liquids as thin liquids did result in silent aspiration before the swallow with larger boluses. Pts cough is not effective to clear. Will follow closely for tolerance.  SLP Visit Diagnosis Dysphagia, oropharyngeal phase (R13.12) Attention and concentration deficit following -- Frontal lobe and executive function deficit following -- Impact on safety and function --  CHL IP TREATMENT RECOMMENDATION 02/16/2018 Treatment Recommendations Therapy as outlined in treatment plan below   Prognosis 02/16/2018 Prognosis for  Safe Diet Advancement Good Barriers to Reach Goals -- Barriers/Prognosis Comment -- CHL IP DIET RECOMMENDATION 02/16/2018 SLP Diet Recommendations Dysphagia 2 (Fine chop) solids;Nectar thick liquid Liquid Administration via Straw Medication Administration Crushed with puree Compensations Slow rate;Small sips/bites;Minimize environmental distractions Postural Changes (No Data)   CHL IP OTHER RECOMMENDATIONS 02/16/2018 Recommended Consults -- Oral Care Recommendations Oral care BID Other Recommendations Have oral suction available   CHL IP FOLLOW UP RECOMMENDATIONS 02/16/2018 Follow up Recommendations Inpatient Rehab   CHL IP FREQUENCY AND DURATION 02/16/2018 Speech Therapy Frequency (ACUTE ONLY) min 2x/week Treatment Duration 2 weeks      CHL IP ORAL PHASE 02/16/2018 Oral Phase Impaired Oral - Pudding Teaspoon -- Oral - Pudding Cup -- Oral - Honey Teaspoon -- Oral - Honey Cup -- Oral - Nectar Teaspoon -- Oral - Nectar Cup -- Oral - Nectar Straw Right anterior bolus loss;Weak lingual manipulation;Decreased bolus cohesion Oral - Thin Teaspoon -- Oral - Thin Cup -- Oral - Thin Straw Decreased bolus cohesion;Weak lingual manipulation;Right anterior bolus loss Oral - Puree Decreased bolus cohesion;Reduced posterior propulsion;Weak lingual manipulation;Delayed oral transit Oral - Mech Soft Decreased bolus cohesion;Reduced posterior propulsion;Weak lingual manipulation;Delayed oral transit Oral - Regular -- Oral - Multi-Consistency -- Oral - Pill -- Oral Phase - Comment --  CHL IP PHARYNGEAL PHASE 02/16/2018 Pharyngeal Phase Impaired Pharyngeal- Pudding Teaspoon -- Pharyngeal -- Pharyngeal- Pudding Cup -- Pharyngeal -- Pharyngeal- Honey Teaspoon -- Pharyngeal -- Pharyngeal- Honey Cup NT Pharyngeal -- Pharyngeal- Nectar Teaspoon -- Pharyngeal -- Pharyngeal- Nectar Cup -- Pharyngeal -- Pharyngeal- Nectar Straw Delayed swallow initiation-pyriform sinuses;Delayed swallow initiation-vallecula;Reduced pharyngeal peristalsis;Reduced  tongue base retraction;Penetration/Apiration after swallow;Trace aspiration;Pharyngeal residue - valleculae Pharyngeal Material enters airway, CONTACTS cords and not ejected out;Material does not enter airway Pharyngeal- Thin Teaspoon -- Pharyngeal -- Pharyngeal- Thin Cup -- Pharyngeal -- Pharyngeal- Thin Straw Delayed swallow initiation-pyriform sinuses;Delayed swallow initiation-vallecula;Reduced pharyngeal peristalsis;Reduced tongue base retraction;Trace aspiration;Pharyngeal residue - valleculae;Penetration/Aspiration before swallow Pharyngeal Material enters airway, passes BELOW cords without attempt by patient to eject out (silent aspiration) Pharyngeal- Puree Delayed swallow initiation-pyriform sinuses;Delayed swallow initiation-vallecula;Reduced pharyngeal peristalsis;Reduced tongue base retraction;Pharyngeal residue - valleculae Pharyngeal Material does not enter airway Pharyngeal- Mechanical Soft Delayed swallow initiation-pyriform sinuses;Delayed swallow initiation-vallecula;Reduced pharyngeal peristalsis;Reduced tongue base retraction;Pharyngeal residue - valleculae Pharyngeal -- Pharyngeal- Regular -- Pharyngeal -- Pharyngeal- Multi-consistency -- Pharyngeal -- Pharyngeal- Pill -- Pharyngeal -- Pharyngeal Comment --  No flowsheet data found. Herbie [place], MA CCC-SLP Acute Rehabilitation Services Pager [phone] Office [phone] Lynann Beaver 02/16/2018, 1:53 PM               TTE  Normal ejection fraction of 55-60% with no regional wall motion abnormalities. Cannot rule out echodense mass on septal leaflet of tricuspid valve   PHYSICAL EXAM  Temp:  [98.4 F (36.9 C)-99.3 F (37.4 C)] 98.9 F (37.2 C) (12/12 1600) Pulse Rate:  [62-83] 72 (12/12 1615) Resp:  [10-28] 19 (12/12 1615) BP: (83-137)/(57-78) 110/68 (12/12 1615) SpO2:  [92 %-100 %] 98 % (12/12 1616) FiO2 (%):  [40 %] 40 % (12/12 1616) Weight:  [94 kg] 94 kg (12/12 0500)  General - Well nourished, well  developed elderly African-American male,who is intubated and sedated Ophthalmologic - fundi not visualized due to noncooperation.  Cardiovascular - Regular rate and rhythm.  Neuro - he is sedated and intubated.he will not follow any commands. He is slightly restless and agitated.  PERRL,  Dolls eye moments are sluggish.  Does not blink to threat bilaterally..  Right facial droop, tongue midline.  Facial sensation symmetrical.    Moving all extremities symmetrically,  No focal weakness  Gait not tested.   ASSESSMENT/PLAN Mr. KRISHANG READING is a 72 y.o. male with history of hypertension, postural hypotension on Florinef, CKD, thrombocytopenia admitted for generalized weakness and lethargy started 5:30 PM 03/10/2018. No tPA given due to [place].    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 Ellene Route and he will follow up to repair AVM once pt stable  Resultant lethargy, nystagmus, right arm ataxia, respiratory distress  CT head cerebellar ICH with SDH  Repeat CT showed decreased right cerebellum and vermis ICH, stable right posterior fossa SDH MRI with and without contrast  No significant interval change in size and appearance of evolving intraparenchymal hemorrhage involving the right cerebellum and cerebellar vermis. Similar regional mass effect without.Associated small right posterior fossa and posterior right cerebral convexity subdural hematoma without significant mass effect, stable. Persistent small volume underlying subarachnoid hemorrhage. 9 mm acute ischemic nonhemorrhagic left cerebellar infarct adjacent to the cerebellar hemorrhage.Small left cerebellar AVM, presumably the source of hemorrhage.hydrocephalus or herniation.  CTA head and neck concerning left cerebellar AVM - Dr. Ellene Route will take care of it once pt stable  2D Echo  Left ventricular ejection fraction 55-60%. No wall motion  abnormalities.Reubin Milan rule out echodense mass on right cuspid valve in apical views  LDL 76  HgbA1c 5.5  SCDs for VTE prophylaxis  NPO  No antithrombotic prior to admission, now on No antithrombotic.   Ongoing aggressive stroke risk factor management  Therapy recommendations:  Pending   Disposition:  Pending   Respiratory distress  Intubated on admission  Extubated 02/15/18  Pt still has mild respiratory distress with difficulty handling secretions  On breathing treatment  Chest PT  CCM on board  Cerebral edema  Posterior fossa mass-effect  No hydrocephalus on CT so far  CT head and neck showed stable hematoma and no hydro  MRI brain 02/17/18 shows stable appearance of the cerebellar hemorrhage and mass effect. As well as small right posterior fossa and right cerebral convexity subdurals. 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 Small left cerebellar AVM  On 3% saline she was discontinued 05/18/17  Sodium 160-will wait for it to slowly come down and not use hypotonic fluids due to concerns about worsening cytotoxic edema   Sodium check every daily  Has central line placement  Hypertension . Stable  BP goal < 140  On low dose cleviprex   Consider po BP meds once pass swallow  Hyperlipidemia  Home meds:  none   LDL 76, goal < 70  No statin at this time  Dysphagia  Did not pass swallow  MBS today  If not passing swallow, may consider cortrak    CKD  Creatinine 1.5->1.5->1.34  On IV fluid  BMP monitoring  Other Stroke Risk Factors  Advanced age  Other Active Problems  Hyperglycemia, improved  Postural hypotension on Florinef at home  Thrombocytopenia platelet 147-> 138  Hypernatremia induced to control cytotoxic edema  Hospital day # 7 Patient remains intubated due to  respiratory failure and is sedated. Neurologically he remains unchanged but serum sodium remains quite high despite hypertonic saline  drip being off for more than 2 days. Recommend continue free water  200 mL every 4 hourly via NG tube.for correction of hypernatremia. D/w  Dr. Vaughan Browner critical care M.D.  No family available at bedside    for discussion   This patient is critically ill due to cerebellar ICH, posterior fossa SDH, cerebral edema, respiratory distress hypertensive emergency and at significant risk of neurological worsening, death form hematoma expansion, cerebral edema, brain herniation, seizure, respiratory failure. This patient's care requires constant monitoring of vital signs, hemodynamics, respiratory and cardiac monitoring, review of multiple databases, neurological assessment, discussion with family, other specialists and medical decision making of high complexity. I spent 30 minutes of neurocritical care time in the care of this patient    Antony Contras, MD Stroke Neurology 02/22/2018 5:11 PM    To contact Stroke Continuity provider, please refer to [email or web address] After hours, contact General Neurology

## 2018-02-22 NOTE — Progress Notes (Signed)
Patient ID: Marvin Mendez, male   DOB: 03-25-1945, 72 y.o.   MRN: [id number] Patient remains intubated at this time.  Arouses to pain but does not follow commands.  Hypernatremia is gradually resolving.  Extubation per critical care.  Continue supportive care.  I have discussed the situation with Dr. Kathyrn Sheriff will arrange for catheter angiography when patient is clinically stable

## 2018-02-23 ENCOUNTER — Inpatient Hospital Stay (HOSPITAL_COMMUNITY): Payer: PPO

## 2018-02-23 DIAGNOSIS — J9600 Acute respiratory failure, unspecified whether with hypoxia or hypercapnia: Secondary | ICD-10-CM

## 2018-02-23 DIAGNOSIS — J189 Pneumonia, unspecified organism: Secondary | ICD-10-CM

## 2018-02-23 LAB — GLUCOSE, CAPILLARY
GLUCOSE-CAPILLARY: 125 mg/dL — AB (ref 70–99)
Glucose-Capillary: 113 mg/dL — ABNORMAL HIGH (ref 70–99)
Glucose-Capillary: 113 mg/dL — ABNORMAL HIGH (ref 70–99)
Glucose-Capillary: 114 mg/dL — ABNORMAL HIGH (ref 70–99)
Glucose-Capillary: 116 mg/dL — ABNORMAL HIGH (ref 70–99)
Glucose-Capillary: 123 mg/dL — ABNORMAL HIGH (ref 70–99)
Glucose-Capillary: 128 mg/dL — ABNORMAL HIGH (ref 70–99)
Glucose-Capillary: 134 mg/dL — ABNORMAL HIGH (ref 70–99)
Glucose-Capillary: 98 mg/dL (ref 70–99)

## 2018-02-23 LAB — BASIC METABOLIC PANEL
Anion gap: 7 (ref 5–15)
BUN: 39 mg/dL — ABNORMAL HIGH (ref 8–23)
CHLORIDE: 123 mmol/L — AB (ref 98–111)
CO2: 21 mmol/L — ABNORMAL LOW (ref 22–32)
Calcium: 7.9 mg/dL — ABNORMAL LOW (ref 8.9–10.3)
Creatinine, Ser: 1.83 mg/dL — ABNORMAL HIGH (ref 0.61–1.24)
GFR calc Af Amer: 41 mL/min — ABNORMAL LOW (ref >60)
GFR calc non Af Amer: 36 mL/min — ABNORMAL LOW (ref >60)
Glucose, Bld: 141 mg/dL — ABNORMAL HIGH (ref 70–99)
Potassium: 3.5 mmol/L (ref 3.5–5.1)
Sodium: 151 mmol/L — ABNORMAL HIGH (ref 135–145)

## 2018-02-23 LAB — CBC
HCT: 32.5 % — ABNORMAL LOW (ref 39.0–52.0)
HEMOGLOBIN: 9.9 g/dL — AB (ref 13.0–17.0)
MCH: 29.8 pg (ref 26.0–34.0)
MCHC: 30.5 g/dL (ref 30.0–36.0)
MCV: 97.9 fL (ref 80.0–100.0)
Platelets: 62 10*3/uL — ABNORMAL LOW (ref 150–400)
RBC: 3.32 MIL/uL — ABNORMAL LOW (ref 4.22–5.81)
RDW: 14.1 % (ref 11.5–15.5)
WBC: 11.2 10*3/uL — ABNORMAL HIGH (ref 4.0–10.5)
nRBC: 0 % (ref 0.0–0.2)

## 2018-02-23 LAB — PHOSPHORUS: Phosphorus: 2.3 mg/dL — ABNORMAL LOW (ref 2.5–4.6)

## 2018-02-23 LAB — MAGNESIUM: Magnesium: 2 mg/dL (ref 1.7–2.4)

## 2018-02-23 MED ORDER — POTASSIUM PHOSPHATES 15 MMOLE/5ML IV SOLN
15.0000 mmol | Freq: Once | INTRAVENOUS | Status: AC
  Administered 2018-02-23: 15 mmol via INTRAVENOUS
  Filled 2018-02-23: qty 5

## 2018-02-23 MED ORDER — FENTANYL CITRATE (PF) 100 MCG/2ML IJ SOLN
25.0000 ug | INTRAMUSCULAR | Status: DC | PRN
  Administered 2018-02-24 (×7): 50 ug via INTRAVENOUS
  Administered 2018-02-25: 25 ug via INTRAVENOUS
  Administered 2018-02-25 – 2018-02-27 (×3): 50 ug via INTRAVENOUS
  Filled 2018-02-23 (×11): qty 2

## 2018-02-23 MED ORDER — FENTANYL BOLUS VIA INFUSION: 25.0000 ug | INTRAVENOUS | Status: DC | PRN

## 2018-02-23 MED ORDER — LACTULOSE 10 GM/15ML PO SOLN
30.0000 g | Freq: Once | ORAL | Status: AC
  Administered 2018-02-23: 30 g
  Filled 2018-02-23: qty 45

## 2018-02-23 NOTE — Progress Notes (Signed)
Patient ID: Marvin Mendez, male   DOB: 12-Jan-1946, 72 y.o.   MRN: [id number] Vital signs are stable patient remains intubated continue supportive care will require angiogram when ultimately extubated.

## 2018-02-23 NOTE — Progress Notes (Signed)
STROKE TEAM PROGRESS NOTE   SUBJECTIVE (INTERVAL HISTORY) No one is at the bedside.    He remains intubated and is more arousable today.  Serum sodium is down to 151   and free water has been started Blood pressure has been adequately controlled blood cultures are negative so far but sputum grew Enterobacter  and Haemophilus   OBJECTIVE Temp:  [98.2 F (36.8 C)-99.5 F (37.5 C)] 98.2 F (36.8 C) (12/13 1200) Pulse Rate:  [65-86] 70 (12/13 1500) Cardiac Rhythm: Normal sinus rhythm (12/13 0800) Resp:  [12-27] 21 (12/13 1500) BP: (104-148)/(60-80) 126/77 (12/13 1400) SpO2:  [96 %-100 %] 99 % (12/13 1500) FiO2 (%):  [40 %] 40 % (12/13 1110) Weight:  [94 kg] 94 kg (12/13 0500)  Recent Labs  Lab 02/22/18 2000 02/23/18 0026 02/23/18 0446 02/23/18 0836 02/23/18 1128  GLUCAP 114* 134* 113* 125* 113*   Recent Labs  Lab 02/19/18 1009 02/20/18 0500 02/21/18 0649 02/21/18 1700 02/22/18 0449 02/22/18 1653 02/23/18 0500  NA 159* 161* 159*  --  158*  --  151*  K 3.2* 3.7 3.7  --  3.5  --  3.5  CL 129* >130* >130*  --  129*  --  123*  CO2 22 20* 20*  --  21*  --  21*  GLUCOSE 128* 122* 147*  --  146*  --  141*  BUN 23 37* 50*  --  51*  --  39*  CREATININE 1.61* 2.21* 2.50*  --  2.38*  --  1.83*  CALCIUM 8.7* 8.8* 8.5*  --  7.8*  --  7.9*  MG  --  2.0 2.1 2.0 2.1 2.2 2.0  PHOS  --  2.0* 1.3* 3.1 2.8 2.6 2.3*   Recent Labs  Lab 02/19/18 1009  AST 48*  ALT 27  ALKPHOS 52  BILITOT 1.3*  PROT 6.9  ALBUMIN 2.8*   Recent Labs  Lab 02/19/18 1009 02/20/18 0500 02/21/18 0649 02/22/18 0449 02/23/18 0500  WBC 8.7 9.6 10.4 11.7* 11.2*  NEUTROABS 6.8  --  7.8*  --   --   HGB 12.0* 11.7* 10.7* 10.2* 9.9*  HCT 37.8* 38.4* 35.2* 33.2* 32.5*  MCV 95.2 97.7 98.9 98.5 97.9  PLT 84* 76* 61* 57* 62*   No results for input(s): CKTOTAL, CKMB, CKMBINDEX, TROPONINI in the last 168 hours. No results for input(s): LABPROT, INR in the last 72 hours. No results for input(s): COLORURINE,  LABSPEC, [place], GLUCOSEU, HGBUR, BILIRUBINUR, KETONESUR, PROTEINUR, UROBILINOGEN, NITRITE, LEUKOCYTESUR in the last 72 hours.  Invalid input(s): APPERANCEUR     Component Value Date/Time   CHOL 134 02/16/2018 0408   TRIG 74 02/16/2018 0408   HDL 43 02/16/2018 0408   CHOLHDL 3.1 02/16/2018 0408   VLDL 15 02/16/2018 0408   LDLCALC 76 02/16/2018 0408   Lab Results  Component Value Date   HGBA1C 5.5 03/12/2018   No results found for: LABOPIA, COCAINSCRNUR, LABBENZ, AMPHETMU, THCU, LABBARB  No results for input(s): ETH in the last 168 hours.  I have personally reviewed the radiological images below and agree with the radiology interpretations.  Ct Angio Head W Or Wo Contrast  Result Date: 02/15/2018 CLINICAL DATA:  72 y/o  M; follow-up of intracranial hemorrhage. EXAM: CT ANGIOGRAPHY HEAD AND NECK TECHNIQUE: Multidetector CT imaging of the head and neck was performed using the standard protocol during bolus administration of intravenous contrast. Multiplanar CT image reconstructions and MIPs were obtained to evaluate the vascular anatomy. Carotid stenosis measurements (when applicable)  are obtained utilizing NASCET criteria, using the distal internal carotid diameter as the denominator. CONTRAST:  65mL ISOVUE-370 IOPAMIDOL (ISOVUE-370) INJECTION 76% COMPARISON:  02/25/2018 CT head. FINDINGS: CTA NECK FINDINGS Aortic arch: Standard branching. Imaged portion shows no evidence of aneurysm or dissection. No significant stenosis of the major arch vessel origins. Right carotid system: No evidence of dissection, stenosis (50% or greater) or occlusion. Left carotid system: No evidence of dissection, stenosis (50% or greater) or occlusion. Vertebral arteries: Codominant. No evidence of dissection, stenosis (50% or greater) or occlusion. Skeleton: Mild-to-moderate cervical spondylosis with multilevel disc and facet degenerative changes. No high-grade bony canal stenosis. Other neck: Negative. Upper chest:  Negative. Review of the MIP images confirms the above findings CTA HEAD FINDINGS Anterior circulation: No significant stenosis, proximal occlusion, aneurysm, or vascular malformation. Posterior circulation: Mild stenosis of the left vertebral artery at the left PICA origin. The left PICA asymmetrically enlarged with a branch extending to a cluster of vessels within the posteromedial aspect of the left cerebellar hemisphere spanning approximately 11 mm (series 12, image [id number] and series 13, image 105, also see sagittal MIPS). The right vertebral, basilar, and bilateral posterior cerebral arteries are unremarkable. Venous sinuses: Hyperdense prominent venous structure within the medial cerebellum, possibly a draining vein from the left cerebellar vascular malformation (series 12, image 22). Anatomic variants: None significant. Delayed phase: No abnormal intracranial enhancement. Review of the MIP images confirms the above findings IMPRESSION: CTA head: 1. Small group of abnormal vessels within the posteromedial aspect of left cerebellar hemisphere spanning approximately 11 mm, suspected arteriovenous malformation. 2. Mild stenosis of left vertebral artery and PICA origin. 3. No additional aneurysm, stenosis, vascular malformation, or occlusion of the anterior and posterior circulation. CTA neck: Patent carotid and vertebral arteries of the neck. No dissection, aneurysm, or hemodynamically significant stenosis by NASCET criteria. These results will be called to the ordering clinician or representative by the Radiologist Assistant, and communication documented in the PACS or zVision Dashboard. Electronically Signed   By: Kristine Garbe M.D.   On: 02/15/2018 19:17   Ct Head Wo Contrast  Result Date: 02/18/2018 CLINICAL DATA:  Follow-up examination for intracranial hemorrhage. EXAM: CT HEAD WITHOUT CONTRAST TECHNIQUE: Contiguous axial images were obtained from the base of the skull through the vertex  without intravenous contrast. COMPARISON:  Prior CT from 02/22/2018. FINDINGS: Brain: Involving central cerebellar hemorrhage decreased in size now measuring 2.5 x 2.7 x 2.5 cm (8.4 cc), previously 2.8 x 3.3 x 3.3 cm (16 cc). Subdural extension with small right parafalcine and tentorial hematoma again noted, similar. Subdural hematoma at the right posterior fossa little interval changed measuring up to 4-5 mm. Persistent localized mass effect with effacement of the suprasellar cistern. Mass effect on the adjacent fourth ventricle which remains partially effaced, relatively similar. No hydrocephalus. Trace subarachnoid hemorrhage within the right temporal occipital region persist. No other new acute intracranial abnormality. No acute large vessel territory infarct. Vascular: No hyperdense vessel. Skull: Scalp soft tissues and calvarium demonstrate no acute finding. Sinuses/Orbits: Globes and orbital soft tissues within normal limits. Scattered mucosal thickening throughout the ethmoidal air cells. Paranasal sinuses are otherwise grossly clear. No mastoid effusion. Other: None. IMPRESSION: 1. Interval decrease in size of evolving acute central cerebellar hematoma (8 cc, previously 16 cc). Persistent regional mass effect without obstructive hydrocephalus. 2. Persistent small subdural hematoma overlying the right posterior fossa and along the falx and tentorium without mass effect, similar to previous. Persistent trace subarachnoid hemorrhage. Electronically Signed   By:  Jeannine Boga M.D.   On: 02/18/2018 03:44   Ct Head Wo Contrast  Result Date: 02/15/2018 CLINICAL DATA:  Altered mental status. Follow-up intracranial hemorrhage. EXAM: CT HEAD WITHOUT CONTRAST TECHNIQUE: Contiguous axial images were obtained from the base of the skull through the vertex without intravenous contrast. COMPARISON:  CT HEAD February 14, 2018 at 1815 hours FINDINGS: BRAIN: Central cerebellar 2.8 x 3.3 x 3.3 cm (volume = 16 cc)  intraparenchymal hematoma was 30 cc. Subdural extension with decreased 2-3 mm falcotentorial subdural hematoma. RIGHT posterior fossa subdural hematoma measuring to 4 mm tracking into the included cervical spine. Effaced supra cerebellar cistern. Regional mass effect resulting in narrowed fourth ventricle without hydrocephalus. Trace subarachnoid hemorrhage. No acute large vascular territory infarct. VASCULAR: Trace calcific atherosclerosis of the carotid siphons. SKULL: No skull fracture. No significant scalp soft tissue swelling. SINUSES/ORBITS: Mild paranasal sinus mucosal thickening. Mastoid air cells are well aerated.The included ocular globes and orbital contents are non-suspicious. OTHER: None. IMPRESSION: 1. Evolving acute central cerebellar hematoma (16 cc, previously 30 cc). Regional mass effect without obstructive hydrocephalus. 2. Decreased falcotentorial and RIGHT posterior fossa subdural hematomas extending into included cervical spine. Small volume subarachnoid hemorrhage. Electronically Signed   By: Elon Alas M.D.   On: 02/15/2018 01:16   Ct Angio Neck W Or Wo Contrast  Result Date: 02/15/2018 CLINICAL DATA:  72 y/o  M; follow-up of intracranial hemorrhage. EXAM: CT ANGIOGRAPHY HEAD AND NECK TECHNIQUE: Multidetector CT imaging of the head and neck was performed using the standard protocol during bolus administration of intravenous contrast. Multiplanar CT image reconstructions and MIPs were obtained to evaluate the vascular anatomy. Carotid stenosis measurements (when applicable) are obtained utilizing NASCET criteria, using the distal internal carotid diameter as the denominator. CONTRAST:  10mL ISOVUE-370 IOPAMIDOL (ISOVUE-370) INJECTION 76% COMPARISON:  02/23/2018 CT head. FINDINGS: CTA NECK FINDINGS Aortic arch: Standard branching. Imaged portion shows no evidence of aneurysm or dissection. No significant stenosis of the major arch vessel origins. Right carotid system: No evidence of  dissection, stenosis (50% or greater) or occlusion. Left carotid system: No evidence of dissection, stenosis (50% or greater) or occlusion. Vertebral arteries: Codominant. No evidence of dissection, stenosis (50% or greater) or occlusion. Skeleton: Mild-to-moderate cervical spondylosis with multilevel disc and facet degenerative changes. No high-grade bony canal stenosis. Other neck: Negative. Upper chest: Negative. Review of the MIP images confirms the above findings CTA HEAD FINDINGS Anterior circulation: No significant stenosis, proximal occlusion, aneurysm, or vascular malformation. Posterior circulation: Mild stenosis of the left vertebral artery at the left PICA origin. The left PICA asymmetrically enlarged with a branch extending to a cluster of vessels within the posteromedial aspect of the left cerebellar hemisphere spanning approximately 11 mm (series 12, image 157-158 and series 13, image 105, also see sagittal MIPS). The right vertebral, basilar, and bilateral posterior cerebral arteries are unremarkable. Venous sinuses: Hyperdense prominent venous structure within the medial cerebellum, possibly a draining vein from the left cerebellar vascular malformation (series 12, image 22). Anatomic variants: None significant. Delayed phase: No abnormal intracranial enhancement. Review of the MIP images confirms the above findings IMPRESSION: CTA head: 1. Small group of abnormal vessels within the posteromedial aspect of left cerebellar hemisphere spanning approximately 11 mm, suspected arteriovenous malformation. 2. Mild stenosis of left vertebral artery and PICA origin. 3. No additional aneurysm, stenosis, vascular malformation, or occlusion of the anterior and posterior circulation. CTA neck: Patent carotid and vertebral arteries of the neck. No dissection, aneurysm, or hemodynamically significant stenosis by  NASCET criteria. These results will be called to the ordering clinician or representative by the  Radiologist Assistant, and communication documented in the PACS or zVision Dashboard. Electronically Signed   By: Kristine Garbe M.D.   On: 02/15/2018 19:17   Mr Jeri Cos OE Contrast  Result Date: 02/17/2018 CLINICAL DATA:  Follow-up exam for intracranial hemorrhage. EXAM: MRI HEAD WITHOUT AND WITH CONTRAST TECHNIQUE: Multiplanar, multiecho pulse sequences of the brain and surrounding structures were obtained without and with intravenous contrast. CONTRAST:  90 cc of Gadavist. COMPARISON:  Comparison made with prior CTA from 02/15/2018 as well as previous CTs from 03/04/2018. FINDINGS: Brain: Intraparenchymal hemorrhage involving the cerebellar vermis and right cerebellar hemisphere again seen, relatively stable in size measuring approximately 3.6 x 2.9 x 3.2 cm. Localized edema with regional mass effect similar as well. Partial effacement of the fourth ventricle and fourth ventricular outflow tract anteriorly which remain patent. Ventricular size is stable without hydrocephalus or ventricular trapping. Slight asymmetry of the lateral ventricles with the left larger than the right noted, likely congenital. Mild mass effect on the brainstem anteriorly without frank compression. No transtentorial herniation. Probable enhancing tangle of vessels at the adjacent posterior left cerebellum suspicious for AVM (series 18, image 16, also seen on prior CTA. No other abnormal enhancement seen underlying the hematoma. Right posterior fossa of subdural hematoma relatively stable measuring up to 4 mm in thickness. Additional small subdural collection overlying the posterior right cerebral convexity measures up to 2 mm without mass effect, likely small volume subdural hemorrhage and/or reactive hygroma. Scattered small volume subarachnoid hemorrhage present within the underlying right temporal occipital region (series 13, image 30). Smooth dural enhancement overlying the right cerebral convexity likely reactive. 9 mm  acute ischemic left cerebellar infarct seen adjacent to the hematoma (series 5, image 58). No other evidence for acute ischemia. Underlying cerebral volume normal. Mild chronic microvascular ischemic changes noted within the periventricular white matter. No other areas of chronic infarction. No mass lesion or abnormal enhancement elsewhere within the brain. Vascular: Major intracranial vascular flow voids maintained left cerebellar AVM measures approximately 13 x 18 mm (series 12, image 7). Skull and upper cervical spine: Craniocervical junction within normal limits. Upper cervical spine normal. Bone marrow signal intensity normal. No scalp soft tissue abnormality. Sinuses/Orbits: Globes and orbital soft tissues within normal limits. Mild scattered mucosal thickening throughout the ethmoidal air cells. Paranasal sinuses are otherwise clear. Trace right mastoid effusion noted, of doubtful significance. Inner ear structures normal. Other: None. IMPRESSION: 1. No significant interval change in size and appearance of evolving intraparenchymal hemorrhage involving the right cerebellum and cerebellar vermis. Similar regional mass effect without hydrocephalus or herniation. 2. Associated small right posterior fossa and posterior right cerebral convexity subdural hematoma without significant mass effect, stable. Persistent small volume underlying subarachnoid hemorrhage. 3. 9 mm acute ischemic nonhemorrhagic left cerebellar infarct adjacent to the cerebellar hemorrhage. 4. Small left cerebellar AVM, presumably the source of hemorrhage. Electronically Signed   By: Jeannine Boga M.D.   On: 02/17/2018 03:39   Dg Chest Port 1 View  Result Date: 02/23/2018 CLINICAL DATA:  Acute respiratory failure EXAM: PORTABLE CHEST 1 VIEW COMPARISON:  02/21/2018 FINDINGS: Endotracheal tube, NG tube, left jugular central venous catheter are stable. Normal heart size. Elevation of the left hemidiaphragm is unchanged with associated  left basilar atelectasis versus airspace disease. Subsegmental atelectasis at the right base is unchanged. No pneumothorax. IMPRESSION: Stable atelectasis versus airspace disease at the left lung base. Electronically Signed  By: Marybelle Killings M.D.   On: 02/23/2018 08:29   Dg Chest Port 1 View  Result Date: 02/21/2018 CLINICAL DATA:  ET tube EXAM: PORTABLE CHEST 1 VIEW COMPARISON:  02/20/2018 FINDINGS: Support devices are stable. Low lung volumes. Mild cardiomegaly and vascular congestion. Left lower lobe atelectasis or infiltrate. No effusions or acute bony abnormality. IMPRESSION: Low lung volumes. Left lower lobe atelectasis or infiltrate. Cardiomegaly, vascular congestion. Electronically Signed   By: Rolm Baptise M.D.   On: 02/21/2018 09:30   Dg Chest Port 1 View  Result Date: 02/20/2018 CLINICAL DATA:  Acute respiratory failure, intubation EXAM: PORTABLE CHEST 1 VIEW COMPARISON:  Portable exam 0555 hours compared to 02/19/2018 FINDINGS: Tip of endotracheal tube projects 4.0 cm above carina. Nasogastric tube coiled in proximal stomach. LEFT jugular central venous catheter with tip projecting over SVC. Normal heart size and mediastinal contours. Elevation of LEFT diaphragm with bibasilar atelectasis. Peribronchial thickening. No acute infiltrate, pleural effusion or pneumothorax. IMPRESSION: Bibasilar atelectasis. Electronically Signed   By: Lavonia Dana M.D.   On: 02/20/2018 09:03   Dg Chest Port 1 View  Result Date: 02/19/2018 CLINICAL DATA:  Intubation. Acute respiratory insufficiency. EXAM: PORTABLE CHEST 1 VIEW 12:58 p.m. COMPARISON:  Chest x-rays dated 02/19/2018 at 5:05 a.m. and 02/17/2018 FINDINGS: Endotracheal tube tip is at the level of the thoracic inlet 6.8 cm above the carina. Central venous catheter tip is in the superior vena cava at the level of the azygos vein. NG tube tip is in the fundus of the stomach. There is chronic elevation of the left hemidiaphragm. Heart size and vascularity  are normal. No infiltrates or effusions. No acute bone abnormality. IMPRESSION: Endotracheal tube and central line appear in good position. No acute cardiopulmonary findings. Electronically Signed   By: Lorriane Shire M.D.   On: 02/19/2018 13:15   Dg Chest Portable 1 View  Result Date: 02/19/2018 CLINICAL DATA:  Aspiration, shortness of breath EXAM: PORTABLE CHEST 1 VIEW COMPARISON:  02/17/2018 FINDINGS: Left central line remains in place, unchanged. Mild cardiomegaly. Elevation of the left hemidiaphragm with left base atelectasis or infiltrate. No confluent opacity on the right. Mild vascular congestion. No acute bony abnormality. IMPRESSION: Left base atelectasis or infiltrate, similar to prior study. Cardiomegaly, vascular congestion. Electronically Signed   By: Rolm Baptise M.D.   On: 02/19/2018 07:37   Dg Chest Port 1 View  Result Date: 02/17/2018 CLINICAL DATA:  Respiratory failure EXAM: PORTABLE CHEST 1 VIEW COMPARISON:  1 day prior FINDINGS: Left internal jugular line tip at mid SVC. Cardiomegaly accentuated by AP portable technique. Possible small left pleural effusion. No pneumothorax. Low lung volumes with resultant pulmonary interstitial prominence. Left lower lobe airspace disease is similar. Right perihilar airspace disease is slightly increased. IMPRESSION: Bilateral airspace disease and progressive, similar on the left on the right. Infection and/or alveolar pulmonary edema. Electronically Signed   By: Abigail Miyamoto M.D.   On: 02/17/2018 08:56   Dg Chest Port 1 View  Result Date: 02/16/2018 CLINICAL DATA:  Respiratory failure EXAM: PORTABLE CHEST 1 VIEW COMPARISON:  02/15/2018 FINDINGS: Left jugular central line is again identified and stable. Cardiac shadow is stable. Increasing left basilar infiltrate with likely small effusion is noted. No pneumothorax is seen. Mild right basilar atelectasis is seen. No bony abnormality is noted. IMPRESSION: Increasing left basilar infiltrate with small  effusion. Increasing right basilar atelectasis. Electronically Signed   By: Inez Catalina M.D.   On: 02/16/2018 07:34   Dg Chest [facility]  Result Date: 02/15/2018 CLINICAL DATA:  Central line placement. EXAM: PORTABLE CHEST 1 VIEW COMPARISON:  Tool 07/2017 FINDINGS: Left internal jugular approach central venous catheter terminates in the expected location of the proximal superior vena cava. The patient has been extubated. Enlarged cardiac silhouette.  Left lower lobe airspace consolidation No evidence of pneumothorax. Elevation of the left hemidiaphragm with airspace consolidation versus atelectasis in the left lower lobe. Left pleural effusion is not excluded. Osseous structures are without acute abnormality. Soft tissues are grossly normal. IMPRESSION: Status post placement of left internal jugular approach central venous catheter with tip in the expected location of the proximal superior vena cava. No evidence of pneumothorax. Electronically Signed   By: Fidela Salisbury M.D.   On: 02/15/2018 17:06   Dg Chest Port 1 View  Result Date: 02/15/2018 CLINICAL DATA:  72 year old male status post intubation. EXAM: PORTABLE CHEST 1 VIEW COMPARISON:  Earlier radiograph dated 02/11/2018 FINDINGS: Endotracheal tube above the carina in similar position. Interval placement of an enteric tube which appears to extend below the diaphragm with tip in the left upper quadrant likely in the gastric fundus. There is shallow inspiration with bibasilar atelectasis. Mild eventration of the left hemidiaphragm. No focal consolidation, pleural effusion, or pneumothorax. Stable cardiac silhouette. No acute osseous pathology. IMPRESSION: Interval placement of an enteric tube with tip likely in the gastric fundus. No other interval change. Electronically Signed   By: Anner Crete M.D.   On: 02/15/2018 01:18   Dg Abd Portable 1v  Result Date: 02/19/2018 CLINICAL DATA:  OG tube placement. EXAM: PORTABLE ABDOMEN - 1 VIEW  COMPARISON:  None FINDINGS: OG tube tip is in the fundus of the stomach. Bowel gas pattern is normal. Increased density in the right side of the abdomen may be due to an enlarged right lobe of the liver. Lumbar chronic elevation of the left hemidiaphragm. Scoliosis. IMPRESSION: OG tube tip in good position in the fundus of the stomach. Possible enlargement of the right lobe of the liver. Electronically Signed   By: Lorriane Shire M.D.   On: 02/19/2018 13:16   Dg Swallowing Func-speech Pathology  Result Date: 02/16/2018 Objective Swallowing Evaluation: Type of Study: MBS-Modified Barium Swallow Study  Patient Details Name: Marvin Mendez MRN: [id number] Date of Birth: [birth year] Today's Date: 02/16/2018 Time: SLP Start Time (ACUTE ONLY): 1210 -SLP Stop Time (ACUTE ONLY): 1235 SLP Time Calculation (min) (ACUTE ONLY): 25 min Past Medical History: Past Medical History: Diagnosis Date . Anemia  . Hypertension  . Renal disorder  Past Surgical History: No past surgical history on file.  Subjective: Pt asleep, able to rouse Assessment / Plan / Recommendation CHL IP CLINICAL IMPRESSIONS 02/16/2018 Clinical Impression Pt demonstrates a mild oral dysphagia and moderate pharyngeal dysphagia characterized by right labial and lingual weakness though lingual propulsion and mastication of liquids and soft soldis appear adequate with only mild residue and anterior spillage. Pharyngeal impairment includes intermittently decreased movement of the base of tongue and upper pharyngeal constrictors for bolus propulsion leading to vallecular residuals ranging from severe to mild. Larger bolus size does appear to trigger best propulsion. A head turn left was also significantly better at reducing vallecular residuals than any other posture. Pt senses residue and sometimes his ineffective efforts to clear appear like gagging. Recommend dys 2 (fine chip) and nectar thick liquids as thin liquids did result in silent aspiration before the  swallow with larger boluses. Pts cough is not effective to clear. Will follow closely for tolerance.  SLP  Visit Diagnosis Dysphagia, oropharyngeal phase (R13.12) Attention and concentration deficit following -- Frontal lobe and executive function deficit following -- Impact on safety and function --   CHL IP TREATMENT RECOMMENDATION 02/16/2018 Treatment Recommendations Therapy as outlined in treatment plan below   Prognosis 02/16/2018 Prognosis for Safe Diet Advancement Good Barriers to Reach Goals -- Barriers/Prognosis Comment -- CHL IP DIET RECOMMENDATION 02/16/2018 SLP Diet Recommendations Dysphagia 2 (Fine chop) solids;Nectar thick liquid Liquid Administration via Straw Medication Administration Crushed with puree Compensations Slow rate;Small sips/bites;Minimize environmental distractions Postural Changes (No Data)   CHL IP OTHER RECOMMENDATIONS 02/16/2018 Recommended Consults -- Oral Care Recommendations Oral care BID Other Recommendations Have oral suction available   CHL IP FOLLOW UP RECOMMENDATIONS 02/16/2018 Follow up Recommendations Inpatient Rehab   CHL IP FREQUENCY AND DURATION 02/16/2018 Speech Therapy Frequency (ACUTE ONLY) min 2x/week Treatment Duration 2 weeks      CHL IP ORAL PHASE 02/16/2018 Oral Phase Impaired Oral - Pudding Teaspoon -- Oral - Pudding Cup -- Oral - Honey Teaspoon -- Oral - Honey Cup -- Oral - Nectar Teaspoon -- Oral - Nectar Cup -- Oral - Nectar Straw Right anterior bolus loss;Weak lingual manipulation;Decreased bolus cohesion Oral - Thin Teaspoon -- Oral - Thin Cup -- Oral - Thin Straw Decreased bolus cohesion;Weak lingual manipulation;Right anterior bolus loss Oral - Puree Decreased bolus cohesion;Reduced posterior propulsion;Weak lingual manipulation;Delayed oral transit Oral - Mech Soft Decreased bolus cohesion;Reduced posterior propulsion;Weak lingual manipulation;Delayed oral transit Oral - Regular -- Oral - Multi-Consistency -- Oral - Pill -- Oral Phase - Comment --  CHL IP  PHARYNGEAL PHASE 02/16/2018 Pharyngeal Phase Impaired Pharyngeal- Pudding Teaspoon -- Pharyngeal -- Pharyngeal- Pudding Cup -- Pharyngeal -- Pharyngeal- Honey Teaspoon -- Pharyngeal -- Pharyngeal- Honey Cup NT Pharyngeal -- Pharyngeal- Nectar Teaspoon -- Pharyngeal -- Pharyngeal- Nectar Cup -- Pharyngeal -- Pharyngeal- Nectar Straw Delayed swallow initiation-pyriform sinuses;Delayed swallow initiation-vallecula;Reduced pharyngeal peristalsis;Reduced tongue base retraction;Penetration/Apiration after swallow;Trace aspiration;Pharyngeal residue - valleculae Pharyngeal Material enters airway, CONTACTS cords and not ejected out;Material does not enter airway Pharyngeal- Thin Teaspoon -- Pharyngeal -- Pharyngeal- Thin Cup -- Pharyngeal -- Pharyngeal- Thin Straw Delayed swallow initiation-pyriform sinuses;Delayed swallow initiation-vallecula;Reduced pharyngeal peristalsis;Reduced tongue base retraction;Trace aspiration;Pharyngeal residue - valleculae;Penetration/Aspiration before swallow Pharyngeal Material enters airway, passes BELOW cords without attempt by patient to eject out (silent aspiration) Pharyngeal- Puree Delayed swallow initiation-pyriform sinuses;Delayed swallow initiation-vallecula;Reduced pharyngeal peristalsis;Reduced tongue base retraction;Pharyngeal residue - valleculae Pharyngeal Material does not enter airway Pharyngeal- Mechanical Soft Delayed swallow initiation-pyriform sinuses;Delayed swallow initiation-vallecula;Reduced pharyngeal peristalsis;Reduced tongue base retraction;Pharyngeal residue - valleculae Pharyngeal -- Pharyngeal- Regular -- Pharyngeal -- Pharyngeal- Multi-consistency -- Pharyngeal -- Pharyngeal- Pill -- Pharyngeal -- Pharyngeal Comment --  No flowsheet data found. Herbie [place], MA CCC-SLP Acute Rehabilitation Services Pager [phone] Office [phone] Lynann Beaver 02/16/2018, 1:53 PM               TTE  Normal ejection fraction of 55-60% with no regional wall  motion abnormalities. Cannot rule out echodense mass on septal leaflet of tricuspid valve   PHYSICAL EXAM  Temp:  [98.2 F (36.8 C)-99.5 F (37.5 C)] 98.2 F (36.8 C) (12/13 1200) Pulse Rate:  [65-86] 70 (12/13 1500) Resp:  [12-27] 21 (12/13 1500) BP: (104-148)/(60-80) 126/77 (12/13 1400) SpO2:  [96 %-100 %] 99 % (12/13 1500) FiO2 (%):  [40 %] 40 % (12/13 1110) Weight:  [94 kg] 94 kg (12/13 0500)  General - Well nourished, well developed elderly African-American male,who is intubated and sedated Ophthalmologic - fundi not visualized due to  noncooperation.  Cardiovascular - Regular rate and rhythm.  Neuro - he is sedated and intubated.he does follow simple midline and extremity one-step commands.    Marland Kitchen  PERRL,  Dolls eye moments are sluggish.  Does not blink to threat bilaterally..  Right facial droop, tongue midline.  Facial sensation symmetrical.    Moving all extremities symmetrically,  No focal weakness  Gait not tested.   ASSESSMENT/PLAN Marvin Mendez is a 72 y.o. male with history of hypertension, postural hypotension on Florinef, CKD, thrombocytopenia admitted for generalized weakness and lethargy started 5:30 PM 03/10/2018. No tPA given due to [place].    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 Ellene Route and he will follow up to repair AVM once pt stable  Resultant lethargy, nystagmus, right arm ataxia, respiratory distress  CT head cerebellar ICH with SDH  Repeat CT showed decreased right cerebellum and vermis ICH, stable right posterior fossa SDH MRI with and without contrast  No significant interval change in size and appearance of evolving intraparenchymal hemorrhage involving the right cerebellum and cerebellar vermis. Similar regional mass effect without.Associated small right posterior fossa and posterior right cerebral convexity subdural hematoma without significant mass  effect, stable. Persistent small volume underlying subarachnoid hemorrhage. 9 mm acute ischemic nonhemorrhagic left cerebellar infarct adjacent to the cerebellar hemorrhage.Small left cerebellar AVM, presumably the source of hemorrhage.hydrocephalus or herniation.  CTA head and neck concerning left cerebellar AVM - Dr. Ellene Route will take care of it once pt stable  2D Echo  Left ventricular ejection fraction 55-60%. No wall motion abnormalities.Reubin Milan rule out echodense mass on right cuspid valve in apical views  LDL 76  HgbA1c 5.5  SCDs for VTE prophylaxis  NPO  No antithrombotic prior to admission, now on No antithrombotic.   Ongoing aggressive stroke risk factor management  Therapy recommendations:  Pending   Disposition:  Pending   Respiratory distress  Intubated on admission  Extubated 02/15/18  Pt still has mild respiratory distress with difficulty handling secretions  On breathing treatment  Chest PT  CCM on board  Cerebral edema  Posterior fossa mass-effect  No hydrocephalus on CT so far  CT head and neck showed stable hematoma and no hydro  MRI brain 02/17/18 shows stable appearance of the cerebellar hemorrhage and mass effect. As well as small right posterior fossa and right cerebral convexity subdurals. 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 Small left cerebellar AVM  On 3% saline she was discontinued 05/18/17  Sodium 160-will wait for it to slowly come down and not use hypotonic fluids due to concerns about worsening cytotoxic edema   Sodium check every daily  Has central line placement  Hypertension . Stable  BP goal < 140  On low dose cleviprex   Consider po BP meds once pass swallow  Hyperlipidemia  Home meds:  none   LDL 76, goal < 70  No statin at this time  Dysphagia  Did not pass swallow  MBS today  If not passing swallow, may consider cortrak    CKD  Creatinine 1.5->1.5->1.34  On IV  fluid  BMP monitoring  Other Stroke Risk Factors  Advanced age  Other Active Problems  Hyperglycemia, improved  Postural hypotension on Florinef at home  Thrombocytopenia platelet 147-> 138  Hypernatremia induced to control cytotoxic edema  Hospital day # 8 Patient remains intubated due to  respiratory  failure and is sedated. Neurologically he remains unchanged but serum sodium  Has now come down with free water 200 mL every 4 hourly via NG tube.for correction of hypernatremia. D/w  Dr. Owens Loffler care M.D.  No family available at bedside    for discussion  Consider extubation over the next few days as his condition improves. This patient is critically ill due to cerebellar ICH, posterior fossa SDH, cerebral edema, respiratory distress hypertensive emergency and at significant risk of neurological worsening, death form hematoma expansion, cerebral edema, brain herniation, seizure, respiratory failure. This patient's care requires constant monitoring of vital signs, hemodynamics, respiratory and cardiac monitoring, review of multiple databases, neurological assessment, discussion with family, other specialists and medical decision making of high complexity. I spent 30 minutes of neurocritical care time in the care of this patient    Antony Contras, MD Stroke Neurology 02/23/2018 3:35 PM    To contact Stroke Continuity provider, please refer to [email or web address] After hours, contact General Neurology

## 2018-02-23 NOTE — Progress Notes (Addendum)
NAME:  Marvin Mendez, MRN:  [id number], DOB:  1945-11-03, LOS: 52 ADMISSION DATE:  03/13/2018, CONSULTATION DATE:  12/5 REFERRING MD:  Dr. Ellender Hose EDP, CHIEF COMPLAINT:  ICH   Brief History   72 year old male admitted for cerebellar ICH on 12/5. Intubated in ED for airway protection. He was then seen by Dr. Ellene Route in ED, and was deemed to not be a surgical candidate . PCCM asked to admit.  Extuabted 12/5.  Reintubated on 12/9 due to poor airway protection and aspiration   Past Medical History   HTN, CKD 3, anemia (b12 deficient), thrombocytopenia, and autonomic postural hypotension (on florinef). Family reports he is a very active man. Works out several times per week. Is very strong and independent.   Significant Hospital Events   12/5 - admitted 02/19/2018 Reintubated. Limited CODE BLUE  Consults:  Neurosurgery - 12/5 Neurology - 12/5  Procedures:  ETT 12/4 > 12/5, 12/9 >>  Significant Diagnostic Tests:  CT head 12/4 > acute IPH in the central cerebellum with estimated volume 29cc. Assocaited edema and mass effect with probable early herniation. Subarachnoid extension. Possible intraventricular extension into fourth ventricle.   CT head 12/5 >>> with decreased central cerebellar hemorrhage to 15 cc.  CT Head 12/8>>> 1. Interval decrease in size of evolving acute central cerebellar hematoma. Persistent regional mass effect without obstructive hydrocephalus, Persistent SDH   Micro Data:  02/19/2018 sputum >> hemophilus, enterobacter 02/19/2018 blood cultures x 2 >>ng  Antimicrobials:  Vanco 12/9 > 12/11 Cefepime 12/9>  Interim history/subjective:   Afebrile Off fent gtt Remains intubated    Objective   Blood pressure (!) 148/78, pulse 84, temperature 99.5 F (37.5 C), temperature source Axillary, resp. rate (!) 24, height 6' (1.829 m), weight 94 kg, SpO2 99 %.    Vent Mode: PSV;CPAP FiO2 (%):  [40 %] 40 % Set Rate:  [18 bmp] 18 bmp Vt Set:  [620 mL] 620 mL PEEP:   [5 cmH20] 5 cmH20 Pressure Support:  [5 cmH20-8 cmH20] 8 cmH20 Plateau Pressure:  [14 cmH20] 14 cmH20   Intake/Output Summary (Last 24 hours) at 02/23/2018 1002 Last data filed at 02/23/2018 8676 Gross per 24 hour  Intake 3511.78 ml  Output 725 ml  Net 2786.78 ml   Filed Weights   02/21/18 0423 02/22/18 0500 02/23/18 0500  Weight: 94.4 kg 94 kg 94 kg    Examination: Gen:   Elderly well-built man, no acute distress HEENT: No pallor, icterus no JVD Lungs:    Clear to auscultation bilaterally; normal respiratory effort, mild cough CV:         Regular rate and rhythm; no murmurs Abd:      + bowel sounds; soft, non-tender; no palpable masses, no distension Ext:    No edema; adequate peripheral perfusion Skin:      Warm and dry; no rash Neuro: Follows commands, moves all extremities  Resolved Hospital Problem list     Assessment & Plan:  Intracerebral Hemorrhage: Cerebellar bleed with subarachnoid and possibly intraventricular extension. He was reportedly on plavix at baseline. Not candidate for surgical intervention based per neurosurgery.  [facility], neurosurgery following -has reached maximum improvement Off 3% saline  Acute resp failure HAP, aspiration Limited CODE BLUE. No tracheostomy Per prior goals of care discussion, he will be one-way extubation once optimized -getting close to that point, would like secretions and coughing to decrease Continue spontaneous breathing trials Continue cefepime for haemophilus, Enterobacter, goal 7 days   CKD III:  Hypernatremia, hyperchloremia Continue to monitor urine output and creatinine Avoid nephrotoxins Continue free water.  D5W at 50 cc an hour -sodium decreasing Hypokalemia and hypo-phosphatemia will be repleted  Postural hypotension  Continue Florinef.  Hyperglycemia without history of DM SSI  Thrombocytopenia. Likely from critical illness Monitor CBC  Best practice:  Diet: Tube feeds Pain/Anxiety/Delirium  sedation for tube tolerance as needed VAP protocol (if indicated): y DVT prophylaxis: SCDs GI prophylaxis: Protonix Glucose control: SSI Mobility: Bed Code Status: Currently limited code after discussion per Dr. Vaughan Browner and family about no long-term intubation or tracheostomy. Family Communication: Family updated daily. Disposition: ICU  Summary-he is tolerating spontaneous breathing trials, pressure support 5/5 but secretions main issue now and whether he will be able to maintain airway post extubation.  Eventual one-way extubation planned.  Neuro status is optimized and sodium has near normalized  The patient is critically ill with multiple organ systems failure and requires high complexity decision making for assessment and support, frequent evaluation and titration of therapies, application of advanced monitoring technologies and extensive interpretation of multiple databases. Critical Care Time devoted to patient care services described in this note independent of APP/resident  time is 31 minutes.   Kara Mead MD. Shade Flood. [facility] Pager [phone] If no response call [phone]     02/23/2018, 10:02 AM   Addendum-detailed discussion with daughter and sister at bedside.  Based on his improvement, they would like to reverse his CODE STATUS to full code.  They would be agreeable to reintubation and tracheostomy if needed We will hold off extubation for another day given secretions  Rakesh V. Elsworth Soho MD

## 2018-02-24 ENCOUNTER — Inpatient Hospital Stay (HOSPITAL_COMMUNITY): Payer: PPO

## 2018-02-24 DIAGNOSIS — Z01818 Encounter for other preprocedural examination: Secondary | ICD-10-CM

## 2018-02-24 DIAGNOSIS — D696 Thrombocytopenia, unspecified: Secondary | ICD-10-CM

## 2018-02-24 DIAGNOSIS — D72829 Elevated white blood cell count, unspecified: Secondary | ICD-10-CM

## 2018-02-24 DIAGNOSIS — G936 Cerebral edema: Secondary | ICD-10-CM

## 2018-02-24 DIAGNOSIS — Q282 Arteriovenous malformation of cerebral vessels: Secondary | ICD-10-CM

## 2018-02-24 DIAGNOSIS — T17908A Unspecified foreign body in respiratory tract, part unspecified causing other injury, initial encounter: Secondary | ICD-10-CM

## 2018-02-24 LAB — CBC
HCT: 31.1 % — ABNORMAL LOW (ref 39.0–52.0)
Hemoglobin: 9.8 g/dL — ABNORMAL LOW (ref 13.0–17.0)
MCH: 30.2 pg (ref 26.0–34.0)
MCHC: 31.5 g/dL (ref 30.0–36.0)
MCV: 95.7 fL (ref 80.0–100.0)
Platelets: 72 10*3/uL — ABNORMAL LOW (ref 150–400)
RBC: 3.25 MIL/uL — ABNORMAL LOW (ref 4.22–5.81)
RDW: 13.8 % (ref 11.5–15.5)
WBC: 10.6 10*3/uL — ABNORMAL HIGH (ref 4.0–10.5)
nRBC: 0 % (ref 0.0–0.2)

## 2018-02-24 LAB — GLUCOSE, CAPILLARY
GLUCOSE-CAPILLARY: 120 mg/dL — AB (ref 70–99)
Glucose-Capillary: 125 mg/dL — ABNORMAL HIGH (ref 70–99)
Glucose-Capillary: 110 mg/dL — ABNORMAL HIGH (ref 70–99)
Glucose-Capillary: 127 mg/dL — ABNORMAL HIGH (ref 70–99)
Glucose-Capillary: 122 mg/dL — ABNORMAL HIGH (ref 70–99)

## 2018-02-24 LAB — CULTURE, BLOOD (ROUTINE X 2)
Culture: NO GROWTH
Culture: NO GROWTH
Special Requests: ADEQUATE
Special Requests: ADEQUATE

## 2018-02-24 LAB — BASIC METABOLIC PANEL
Anion gap: 6 (ref 5–15)
BUN: 32 mg/dL — ABNORMAL HIGH (ref 8–23)
CO2: 23 mmol/L (ref 22–32)
Calcium: 7.8 mg/dL — ABNORMAL LOW (ref 8.9–10.3)
Chloride: 118 mmol/L — ABNORMAL HIGH (ref 98–111)
Creatinine, Ser: 1.49 mg/dL — ABNORMAL HIGH (ref 0.61–1.24)
GFR calc non Af Amer: 46 mL/min — ABNORMAL LOW (ref >60)
GFR, EST AFRICAN AMERICAN: 53 mL/min — AB (ref >60)
Glucose, Bld: 131 mg/dL — ABNORMAL HIGH (ref 70–99)
Potassium: 4.3 mmol/L (ref 3.5–5.1)
Sodium: 147 mmol/L — ABNORMAL HIGH (ref 135–145)

## 2018-02-24 LAB — MAGNESIUM: Magnesium: 2.1 mg/dL (ref 1.7–2.4)

## 2018-02-24 LAB — PHOSPHORUS: PHOSPHORUS: 2 mg/dL — AB (ref 2.5–4.6)

## 2018-02-24 MED ORDER — SODIUM CHLORIDE 0.9 % IV SOLN
INTRAVENOUS | Status: DC
  Administered 2018-02-24: 12:00:00 via INTRAVENOUS

## 2018-02-24 MED ORDER — SODIUM CHLORIDE 0.9 % IV SOLN
2.0000 g | Freq: Two times a day (BID) | INTRAVENOUS | Status: AC
  Administered 2018-02-24 – 2018-02-25 (×3): 2 g via INTRAVENOUS
  Filled 2018-02-24 (×3): qty 2

## 2018-02-24 NOTE — Progress Notes (Signed)
NAME:  Marvin Mendez, MRN:  [id number], DOB:  [birth year], LOS: 40 ADMISSION DATE:  02/28/2018, CONSULTATION DATE:  12/5 REFERRING MD:  Ellender Hose, CHIEF COMPLAINT:  ICH   Brief History   72 y/o male with intracerebellar ICH on 12/5, intubated for airway protection.  Extubated 12/5 but re-intubated for aspiraiton on 12/9.   Past Medical History   HTN, CKD 3, anemia (b12 deficient), thrombocytopenia, and autonomic postural hypotension (on florinef). Family reports he is a very active man. Works out several times per week. Is very strong and independent.   Significant Hospital Events   12/5 - admitted 02/19/2018 Reintubated. Limited CODE BLUE 12/13 changed code status back to full code  Consults:  Neurosurgery - 12/5 Neurology - 12/5  Procedures:  ETT 12/4 > 12/5, 12/9 >>  Significant Diagnostic Tests:  CT head 12/4 > acute IPH in the central cerebellum with estimated volume 29cc. Assocaited edema and mass effect with probable early herniation. Subarachnoid extension. Possible intraventricular extension into fourth ventricle.   CT head 12/5 >>> with decreased central cerebellar hemorrhage to 15 cc.  CT Head 12/8>>> 1. Interval decrease in size of evolving acute central cerebellar hematoma. Persistent regional mass effect without obstructive hydrocephalus, Persistent SDH  Micro Data:  02/19/2018 sputum >> hemophilus, enterobacter 02/19/2018 blood cultures x 2 >>ng  Antimicrobials:  Vanco 12/9 > 12/11 Cefepime 12/9>  Interim history/subjective:   Heavy secretions, tolerating 5/5 pressure support ventilation Remains intubated  Objective   Blood pressure (!) 141/75, pulse 77, temperature 100.1 F (37.8 C), temperature source Axillary, resp. rate (!) 24, height 6' (1.829 m), weight 97.8 kg, SpO2 100 %.    Vent Mode: PSV;CPAP FiO2 (%):  [40 %] 40 % Set Rate:  [18 bmp] 18 bmp Vt Set:  [620 mL] 620 mL PEEP:  [5 cmH20] 5 cmH20 Pressure Support:  [5 cmH20-8 cmH20] 5  cmH20 Plateau Pressure:  [18 cmH20] 18 cmH20   Intake/Output Summary (Last 24 hours) at 02/24/2018 1241 Last data filed at 02/24/2018 0500 Gross per 24 hour  Intake 1260.64 ml  Output 800 ml  Net 460.64 ml   Filed Weights   02/22/18 0500 02/23/18 0500 02/24/18 0432  Weight: 94 kg 94 kg 97.8 kg    Examination:  General:  In bed on vent HENT: NCAT ETT in place PULM: CTA B, vent supported breathing CV: RRR, no mgr GI: BS+, soft, nontender MSK: normal bulk and tone Neuro: sedated on vent    Resolved Hospital Problem list     Assessment & Plan:  ICH: cerebellar bleed with subarachnoid and possibly intraventricular extension Acute encephalopathy > monitor off of 3% saline  Acute respiratory failure  Hospital associated pneumonia, aspiration > continue SBT today/pressure support ventilatoin > plan 7 days total of antibiotics for aspiration pneumonia  CKD III: hypernatremia/hyperchloremia > monitor UOP/renal function > avoid nephrotoxins > D5 stopped this morning  Postural hypotension > contiue florinef  Hyperglycemia without DM > SSI  Thrombocytopenia from critical illness > Monitor for bleeding, CBC prn  Best practice:  Diet: Tube feeding Pain/Anxiety/Delirium protocol (if indicated): yes, RASS goal 0, intermittent fentanyl and versed for sedation VAP protocol (if indicated): yes DVT prophylaxis: SCD GI prophylaxis: Pantoprazole for stress ulcer prophylaxis Glucose control: SSI Mobility: n/a Code Status: full Family Communication: updated niece, sister bedside Disposition:   Labs   CBC: Recent Labs  Lab 02/19/18 1009 02/20/18 0500 02/21/18 0649 02/22/18 0449 02/23/18 0500 02/24/18 0500  WBC 8.7 9.6 10.4 11.7* 11.2* 10.6*  NEUTROABS  6.8  --  7.8*  --   --   --   HGB 12.0* 11.7* 10.7* 10.2* 9.9* 9.8*  HCT 37.8* 38.4* 35.2* 33.2* 32.5* 31.1*  MCV 95.2 97.7 98.9 98.5 97.9 95.7  PLT 84* 76* 61* 57* 62* 72*    Basic Metabolic Panel: Recent Labs   Lab 02/20/18 0500 02/21/18 0649 02/21/18 1700 02/22/18 0449 02/22/18 1653 02/23/18 0500 02/24/18 0500  NA 161* 159*  --  158*  --  151* 147*  K 3.7 3.7  --  3.5  --  3.5 4.3  CL >130* >130*  --  129*  --  123* 118*  CO2 20* 20*  --  21*  --  21* 23  GLUCOSE 122* 147*  --  146*  --  141* 131*  BUN 37* 50*  --  51*  --  39* 32*  CREATININE 2.21* 2.50*  --  2.38*  --  1.83* 1.49*  CALCIUM 8.8* 8.5*  --  7.8*  --  7.9* 7.8*  MG 2.0 2.1 2.0 2.1 2.2 2.0 2.1  PHOS 2.0* 1.3* 3.1 2.8 2.6 2.3* 2.0*   GFR: Estimated Creatinine Clearance: 52.7 mL/min (A) (by C-G formula based on SCr of 1.49 mg/dL (H)). Recent Labs  Lab 02/19/18 1009 02/20/18 0500 02/21/18 0649 02/22/18 0449 02/23/18 0500 02/24/18 0500  PROCALCITON 0.12 0.25 0.35  --   --   --   WBC 8.7 9.6 10.4 11.7* 11.2* 10.6*  LATICACIDVEN 1.0  --   --   --   --   --     Liver Function Tests: Recent Labs  Lab 02/19/18 1009  AST 48*  ALT 27  ALKPHOS 52  BILITOT 1.3*  PROT 6.9  ALBUMIN 2.8*   No results for input(s): LIPASE, AMYLASE in the last 168 hours. No results for input(s): AMMONIA in the last 168 hours.  ABG    Component Value Date/Time   PHART 7.327 (L) 02/19/2018 1335   PCO2ART 46.9 02/19/2018 1335   PO2ART 452.0 (H) 02/19/2018 1335   HCO3 24.6 02/19/2018 1335   TCO2 26 02/19/2018 1335   ACIDBASEDEF 1.0 02/19/2018 1335   O2SAT 100.0 02/19/2018 1335     Coagulation Profile: No results for input(s): INR, PROTIME in the last 168 hours.  Cardiac Enzymes: No results for input(s): CKTOTAL, CKMB, CKMBINDEX, TROPONINI in the last 168 hours.  HbA1C: Hgb A1c MFr Bld  Date/Time Value Ref Range Status  02/26/2018 11:49 PM 5.5 4.8 - 5.6 % Final    Comment:    (NOTE) Pre diabetes:          5.7%-6.4% Diabetes:              >6.4% Glycemic control for   <7.0% adults with diabetes     CBG: Recent Labs  Lab 02/23/18 1914 02/23/18 2348 02/24/18 0400 02/24/18 0736 02/24/18 1156  GLUCAP 116* 123* 125*  110* 127*     Critical care time: 35 minutes    Roselie Awkward, MD [place] Pager: [phone] Cell: [phone] If no response, call [phone]

## 2018-02-24 NOTE — Progress Notes (Signed)
Patient ID: Marvin Mendez, male   DOB: 23-Jul-1945, 72 y.o.   MRN: [id number] No acute events overnight  Intubated, eyes open to voice, FCx4 with persistence   No change in neurosurgical plan of care at this time. Dr. Ellene Route to determine timing of catheter angiogram when he returns on Monday.

## 2018-02-24 NOTE — Progress Notes (Signed)
Pharmacy Antibiotic Note  Marvin Mendez is a 72 y.o. male admitted on 02/22/2018 with pneumonia - enterobacter and H. flu growing in TA cx. Pharmacy has been consulted for cefepime dosing. SCr continues to improve, now to 1.49 with estimated CrCl ~52 mL/min.  Plan: Increase cefepime to 2g IV q12h for improving renal function Stop date scheduled for 12/15 - LOT 7 days  Height: 6' (182.9 cm) Weight: 215 lb 9.8 oz (97.8 kg) IBW/kg (Calculated) : 77.6  Temp (24hrs), Avg:99.2 F (37.3 C), Min:98.7 F (37.1 C), Max:100.1 F (37.8 C)  Recent Labs  Lab 02/19/18 1009 02/20/18 0500 02/21/18 0649 02/22/18 0449 02/23/18 0500 02/24/18 0500  WBC 8.7 9.6 10.4 11.7* 11.2* 10.6*  CREATININE 1.61* 2.21* 2.50* 2.38* 1.83* 1.49*  LATICACIDVEN 1.0  --   --   --   --   --     Estimated Creatinine Clearance: 52.7 mL/min (A) (by C-G formula based on SCr of 1.49 mg/dL (H)).    Allergies  Allergen Reactions  . Midodrine   . Aspirin Hives  . Codeine Hives    Antimicrobials this admission: Cefepime 12/9 >> (12/15) Vanc 12/9 >> 12/11  Microbiology: 12/10 UC - neg 12/9 TA - abundant H. flu (beta lactamase +), Enterobacter (S-cefepime) 12/9 bcx - NGTD  Aviyah Swetz N. Gerarda Fraction, PharmD PGY2 Infectious Diseases Pharmacy Resident Phone: [phone] 02/24/2018 1:35 PM

## 2018-02-24 NOTE — Progress Notes (Signed)
STROKE TEAM PROGRESS NOTE   SUBJECTIVE (INTERVAL HISTORY) Daughter and grandson, RN and CCM NP are at the bedside. He remains intubated and drowsy just received the fentanyl.  Follow some simple commands, still on ventilation with copious secretions.  CT repeat this morning showed stable evolving hematoma, no hydrocephalus.  OBJECTIVE Temp:  [98.2 F (36.8 C)-100.1 F (37.8 C)] 100.1 F (37.8 C) (12/14 0730) Pulse Rate:  [66-84] 77 (12/14 1100) Cardiac Rhythm: Normal sinus rhythm (12/13 2000) Resp:  [16-29] 24 (12/14 1100) BP: (104-151)/(59-86) 141/75 (12/14 1100) SpO2:  [98 %-100 %] 100 % (12/14 1100) FiO2 (%):  [40 %] 40 % (12/14 0846) Weight:  [97.8 kg] 97.8 kg (12/14 0432)  Recent Labs  Lab 02/23/18 1626 02/23/18 1914 02/23/18 2348 02/24/18 0400 02/24/18 0736  GLUCAP 98 116* 123* 125* 110*   Recent Labs  Lab 02/20/18 0500 02/21/18 0649 02/21/18 1700 02/22/18 0449 02/22/18 1653 02/23/18 0500 02/24/18 0500  NA 161* 159*  --  158*  --  151* 147*  K 3.7 3.7  --  3.5  --  3.5 4.3  CL >130* >130*  --  129*  --  123* 118*  CO2 20* 20*  --  21*  --  21* 23  GLUCOSE 122* 147*  --  146*  --  141* 131*  BUN 37* 50*  --  51*  --  39* 32*  CREATININE 2.21* 2.50*  --  2.38*  --  1.83* 1.49*  CALCIUM 8.8* 8.5*  --  7.8*  --  7.9* 7.8*  MG 2.0 2.1 2.0 2.1 2.2 2.0 2.1  PHOS 2.0* 1.3* 3.1 2.8 2.6 2.3* 2.0*   Recent Labs  Lab 02/19/18 1009  AST 48*  ALT 27  ALKPHOS 52  BILITOT 1.3*  PROT 6.9  ALBUMIN 2.8*   Recent Labs  Lab 02/19/18 1009 02/20/18 0500 02/21/18 0649 02/22/18 0449 02/23/18 0500 02/24/18 0500  WBC 8.7 9.6 10.4 11.7* 11.2* 10.6*  NEUTROABS 6.8  --  7.8*  --   --   --   HGB 12.0* 11.7* 10.7* 10.2* 9.9* 9.8*  HCT 37.8* 38.4* 35.2* 33.2* 32.5* 31.1*  MCV 95.2 97.7 98.9 98.5 97.9 95.7  PLT 84* 76* 61* 57* 62* 72*   No results for input(s): CKTOTAL, CKMB, CKMBINDEX, TROPONINI in the last 168 hours. No results for input(s): LABPROT, INR in the last 72  hours. No results for input(s): COLORURINE, LABSPEC, [place], GLUCOSEU, HGBUR, BILIRUBINUR, KETONESUR, PROTEINUR, UROBILINOGEN, NITRITE, LEUKOCYTESUR in the last 72 hours.  Invalid input(s): APPERANCEUR     Component Value Date/Time   CHOL 134 02/16/2018 0408   TRIG 74 02/16/2018 0408   HDL 43 02/16/2018 0408   CHOLHDL 3.1 02/16/2018 0408   VLDL 15 02/16/2018 0408   LDLCALC 76 02/16/2018 0408   Lab Results  Component Value Date   HGBA1C 5.5 03/13/2018   No results found for: LABOPIA, COCAINSCRNUR, LABBENZ, AMPHETMU, THCU, LABBARB  No results for input(s): ETH in the last 168 hours.  I have personally reviewed the radiological images below and agree with the radiology interpretations.  Ct Angio Head W Or Wo Contrast  Result Date: 02/15/2018 CLINICAL DATA:  72 y/o  M; follow-up of intracranial hemorrhage. EXAM: CT ANGIOGRAPHY HEAD AND NECK TECHNIQUE: Multidetector CT imaging of the head and neck was performed using the standard protocol during bolus administration of intravenous contrast. Multiplanar CT image reconstructions and MIPs were obtained to evaluate the vascular anatomy. Carotid stenosis measurements (when applicable) are obtained utilizing NASCET  criteria, using the distal internal carotid diameter as the denominator. CONTRAST:  51mL ISOVUE-370 IOPAMIDOL (ISOVUE-370) INJECTION 76% COMPARISON:  03/02/2018 CT head. FINDINGS: CTA NECK FINDINGS Aortic arch: Standard branching. Imaged portion shows no evidence of aneurysm or dissection. No significant stenosis of the major arch vessel origins. Right carotid system: No evidence of dissection, stenosis (50% or greater) or occlusion. Left carotid system: No evidence of dissection, stenosis (50% or greater) or occlusion. Vertebral arteries: Codominant. No evidence of dissection, stenosis (50% or greater) or occlusion. Skeleton: Mild-to-moderate cervical spondylosis with multilevel disc and facet degenerative changes. No high-grade bony canal  stenosis. Other neck: Negative. Upper chest: Negative. Review of the MIP images confirms the above findings CTA HEAD FINDINGS Anterior circulation: No significant stenosis, proximal occlusion, aneurysm, or vascular malformation. Posterior circulation: Mild stenosis of the left vertebral artery at the left PICA origin. The left PICA asymmetrically enlarged with a branch extending to a cluster of vessels within the posteromedial aspect of the left cerebellar hemisphere spanning approximately 11 mm (series 12, image [id number] and series 13, image 105, also see sagittal MIPS). The right vertebral, basilar, and bilateral posterior cerebral arteries are unremarkable. Venous sinuses: Hyperdense prominent venous structure within the medial cerebellum, possibly a draining vein from the left cerebellar vascular malformation (series 12, image 22). Anatomic variants: None significant. Delayed phase: No abnormal intracranial enhancement. Review of the MIP images confirms the above findings IMPRESSION: CTA head: 1. Small group of abnormal vessels within the posteromedial aspect of left cerebellar hemisphere spanning approximately 11 mm, suspected arteriovenous malformation. 2. Mild stenosis of left vertebral artery and PICA origin. 3. No additional aneurysm, stenosis, vascular malformation, or occlusion of the anterior and posterior circulation. CTA neck: Patent carotid and vertebral arteries of the neck. No dissection, aneurysm, or hemodynamically significant stenosis by NASCET criteria. These results will be called to the ordering clinician or representative by the Radiologist Assistant, and communication documented in the PACS or zVision Dashboard. Electronically Signed   By: Kristine Garbe M.D.   On: 02/15/2018 19:17   Ct Head Wo Contrast  Result Date: 02/24/2018 CLINICAL DATA:  Follow up intracranial hemorrhage. EXAM: CT HEAD WITHOUT CONTRAST TECHNIQUE: Contiguous axial images were obtained from the base of the  skull through the vertex without intravenous contrast. COMPARISON:  CT HEAD February 18, 2018 FINDINGS: BRAIN: Evolving central cerebellar 2.9 x 3.1 cm hematoma, appearing larger than prior CT though, overall less dense. Regional mass effect and potential fourth ventricle intra ventricular extension. Small to moderate volume predominately supratentorial subarachnoid hemorrhage. Trace dense falcotentorial subdural hematoma. Effaced suprasellar cistern. No hydrocephalus. No supratentorial parenchymal hemorrhage, midline shift or acute large vascular territory infarct. Patchy supratentorial white matter hypodensities compatible with mild chronic small vessel ischemic changes, normal for age. VASCULAR: Trace calcific atherosclerosis of the carotid siphons. SKULL: No skull fracture. No significant scalp soft tissue swelling. SINUSES/ORBITS: Mild paranasal sinus mucosal thickening. Mastoid air cells are well aerated.The included ocular globes and orbital contents are non-suspicious. OTHER: None. IMPRESSION: 1. Evolving subacute cerebellar hematoma appears larger than prior examination. Suspected intraventricular extension. Small to moderate presumably redistributed subarachnoid hemorrhage with trace falcotentorial subdural hematoma. 2. Similar upward cerebellar herniation and mass effect on fourth ventricle without hydrocephalus. Electronically Signed   By: Elon Alas M.D.   On: 02/24/2018 05:21   Ct Head Wo Contrast  Result Date: 02/18/2018 CLINICAL DATA:  Follow-up examination for intracranial hemorrhage. EXAM: CT HEAD WITHOUT CONTRAST TECHNIQUE: Contiguous axial images were obtained from the base of the skull  through the vertex without intravenous contrast. COMPARISON:  Prior CT from 02/24/2018. FINDINGS: Brain: Involving central cerebellar hemorrhage decreased in size now measuring 2.5 x 2.7 x 2.5 cm (8.4 cc), previously 2.8 x 3.3 x 3.3 cm (16 cc). Subdural extension with small right parafalcine and  tentorial hematoma again noted, similar. Subdural hematoma at the right posterior fossa little interval changed measuring up to 4-5 mm. Persistent localized mass effect with effacement of the suprasellar cistern. Mass effect on the adjacent fourth ventricle which remains partially effaced, relatively similar. No hydrocephalus. Trace subarachnoid hemorrhage within the right temporal occipital region persist. No other new acute intracranial abnormality. No acute large vessel territory infarct. Vascular: No hyperdense vessel. Skull: Scalp soft tissues and calvarium demonstrate no acute finding. Sinuses/Orbits: Globes and orbital soft tissues within normal limits. Scattered mucosal thickening throughout the ethmoidal air cells. Paranasal sinuses are otherwise grossly clear. No mastoid effusion. Other: None. IMPRESSION: 1. Interval decrease in size of evolving acute central cerebellar hematoma (8 cc, previously 16 cc). Persistent regional mass effect without obstructive hydrocephalus. 2. Persistent small subdural hematoma overlying the right posterior fossa and along the falx and tentorium without mass effect, similar to previous. Persistent trace subarachnoid hemorrhage. Electronically Signed   By: Jeannine Boga M.D.   On: 02/18/2018 03:44   Ct Head Wo Contrast  Result Date: 02/15/2018 CLINICAL DATA:  Altered mental status. Follow-up intracranial hemorrhage. EXAM: CT HEAD WITHOUT CONTRAST TECHNIQUE: Contiguous axial images were obtained from the base of the skull through the vertex without intravenous contrast. COMPARISON:  CT HEAD February 14, 2018 at 1815 hours FINDINGS: BRAIN: Central cerebellar 2.8 x 3.3 x 3.3 cm (volume = 16 cc) intraparenchymal hematoma was 30 cc. Subdural extension with decreased 2-3 mm falcotentorial subdural hematoma. RIGHT posterior fossa subdural hematoma measuring to 4 mm tracking into the included cervical spine. Effaced supra cerebellar cistern. Regional mass effect resulting in  narrowed fourth ventricle without hydrocephalus. Trace subarachnoid hemorrhage. No acute large vascular territory infarct. VASCULAR: Trace calcific atherosclerosis of the carotid siphons. SKULL: No skull fracture. No significant scalp soft tissue swelling. SINUSES/ORBITS: Mild paranasal sinus mucosal thickening. Mastoid air cells are well aerated.The included ocular globes and orbital contents are non-suspicious. OTHER: None. IMPRESSION: 1. Evolving acute central cerebellar hematoma (16 cc, previously 30 cc). Regional mass effect without obstructive hydrocephalus. 2. Decreased falcotentorial and RIGHT posterior fossa subdural hematomas extending into included cervical spine. Small volume subarachnoid hemorrhage. Electronically Signed   By: Elon Alas M.D.   On: 02/15/2018 01:16   Ct Angio Neck W Or Wo Contrast  Result Date: 02/15/2018 CLINICAL DATA:  72 y/o  M; follow-up of intracranial hemorrhage. EXAM: CT ANGIOGRAPHY HEAD AND NECK TECHNIQUE: Multidetector CT imaging of the head and neck was performed using the standard protocol during bolus administration of intravenous contrast. Multiplanar CT image reconstructions and MIPs were obtained to evaluate the vascular anatomy. Carotid stenosis measurements (when applicable) are obtained utilizing NASCET criteria, using the distal internal carotid diameter as the denominator. CONTRAST:  16mL ISOVUE-370 IOPAMIDOL (ISOVUE-370) INJECTION 76% COMPARISON:  02/24/2018 CT head. FINDINGS: CTA NECK FINDINGS Aortic arch: Standard branching. Imaged portion shows no evidence of aneurysm or dissection. No significant stenosis of the major arch vessel origins. Right carotid system: No evidence of dissection, stenosis (50% or greater) or occlusion. Left carotid system: No evidence of dissection, stenosis (50% or greater) or occlusion. Vertebral arteries: Codominant. No evidence of dissection, stenosis (50% or greater) or occlusion. Skeleton: Mild-to-moderate cervical  spondylosis with multilevel disc and  facet degenerative changes. No high-grade bony canal stenosis. Other neck: Negative. Upper chest: Negative. Review of the MIP images confirms the above findings CTA HEAD FINDINGS Anterior circulation: No significant stenosis, proximal occlusion, aneurysm, or vascular malformation. Posterior circulation: Mild stenosis of the left vertebral artery at the left PICA origin. The left PICA asymmetrically enlarged with a branch extending to a cluster of vessels within the posteromedial aspect of the left cerebellar hemisphere spanning approximately 11 mm (series 12, image 157-158 and series 13, image 105, also see sagittal MIPS). The right vertebral, basilar, and bilateral posterior cerebral arteries are unremarkable. Venous sinuses: Hyperdense prominent venous structure within the medial cerebellum, possibly a draining vein from the left cerebellar vascular malformation (series 12, image 22). Anatomic variants: None significant. Delayed phase: No abnormal intracranial enhancement. Review of the MIP images confirms the above findings IMPRESSION: CTA head: 1. Small group of abnormal vessels within the posteromedial aspect of left cerebellar hemisphere spanning approximately 11 mm, suspected arteriovenous malformation. 2. Mild stenosis of left vertebral artery and PICA origin. 3. No additional aneurysm, stenosis, vascular malformation, or occlusion of the anterior and posterior circulation. CTA neck: Patent carotid and vertebral arteries of the neck. No dissection, aneurysm, or hemodynamically significant stenosis by NASCET criteria. These results will be called to the ordering clinician or representative by the Radiologist Assistant, and communication documented in the PACS or zVision Dashboard. Electronically Signed   By: Kristine Garbe M.D.   On: 02/15/2018 19:17   Mr Jeri Cos XH Contrast  Result Date: 02/17/2018 CLINICAL DATA:  Follow-up exam for intracranial hemorrhage.  EXAM: MRI HEAD WITHOUT AND WITH CONTRAST TECHNIQUE: Multiplanar, multiecho pulse sequences of the brain and surrounding structures were obtained without and with intravenous contrast. CONTRAST:  90 cc of Gadavist. COMPARISON:  Comparison made with prior CTA from 02/15/2018 as well as previous CTs from 02/21/2018. FINDINGS: Brain: Intraparenchymal hemorrhage involving the cerebellar vermis and right cerebellar hemisphere again seen, relatively stable in size measuring approximately 3.6 x 2.9 x 3.2 cm. Localized edema with regional mass effect similar as well. Partial effacement of the fourth ventricle and fourth ventricular outflow tract anteriorly which remain patent. Ventricular size is stable without hydrocephalus or ventricular trapping. Slight asymmetry of the lateral ventricles with the left larger than the right noted, likely congenital. Mild mass effect on the brainstem anteriorly without frank compression. No transtentorial herniation. Probable enhancing tangle of vessels at the adjacent posterior left cerebellum suspicious for AVM (series 18, image 16, also seen on prior CTA. No other abnormal enhancement seen underlying the hematoma. Right posterior fossa of subdural hematoma relatively stable measuring up to 4 mm in thickness. Additional small subdural collection overlying the posterior right cerebral convexity measures up to 2 mm without mass effect, likely small volume subdural hemorrhage and/or reactive hygroma. Scattered small volume subarachnoid hemorrhage present within the underlying right temporal occipital region (series 13, image 30). Smooth dural enhancement overlying the right cerebral convexity likely reactive. 9 mm acute ischemic left cerebellar infarct seen adjacent to the hematoma (series 5, image 58). No other evidence for acute ischemia. Underlying cerebral volume normal. Mild chronic microvascular ischemic changes noted within the periventricular white matter. No other areas of chronic  infarction. No mass lesion or abnormal enhancement elsewhere within the brain. Vascular: Major intracranial vascular flow voids maintained left cerebellar AVM measures approximately 13 x 18 mm (series 12, image 7). Skull and upper cervical spine: Craniocervical junction within normal limits. Upper cervical spine normal. Bone marrow signal intensity normal. No  scalp soft tissue abnormality. Sinuses/Orbits: Globes and orbital soft tissues within normal limits. Mild scattered mucosal thickening throughout the ethmoidal air cells. Paranasal sinuses are otherwise clear. Trace right mastoid effusion noted, of doubtful significance. Inner ear structures normal. Other: None. IMPRESSION: 1. No significant interval change in size and appearance of evolving intraparenchymal hemorrhage involving the right cerebellum and cerebellar vermis. Similar regional mass effect without hydrocephalus or herniation. 2. Associated small right posterior fossa and posterior right cerebral convexity subdural hematoma without significant mass effect, stable. Persistent small volume underlying subarachnoid hemorrhage. 3. 9 mm acute ischemic nonhemorrhagic left cerebellar infarct adjacent to the cerebellar hemorrhage. 4. Small left cerebellar AVM, presumably the source of hemorrhage. Electronically Signed   By: Jeannine Boga M.D.   On: 02/17/2018 03:39     PHYSICAL EXAM  Temp:  [98.2 F (36.8 C)-100.1 F (37.8 C)] 100.1 F (37.8 C) (12/14 0730) Pulse Rate:  [66-84] 77 (12/14 1100) Resp:  [16-29] 24 (12/14 1100) BP: (104-151)/(59-86) 141/75 (12/14 1100) SpO2:  [98 %-100 %] 100 % (12/14 1100) FiO2 (%):  [40 %] 40 % (12/14 0846) Weight:  [97.8 kg] 97.8 kg (12/14 0432)  General - Well nourished, well developed elderly African-American male,who is intubated and sedated  Ophthalmologic - fundi not visualized due to noncooperation.  Cardiovascular - Regular rate and rhythm.  Neuro - he is sedated and intubated. he is drowsy  after receiving fentanyl.  Open eyes on voice, does follow limited midline and extremity one-step commands.   PERRL,  Dolls eye moments are sluggish.  Does not blink to threat bilaterally.. Facial symmetry not able to test due to ET tube, tongue midline.  Withdraw to pain summation in all extremities, BUE 3-/5, RLE 3-/5, LLE 2-/5,  Sensation, coordination and gait not tested.   ASSESSMENT/PLAN Marvin Mendez is a 72 y.o. male with history of hypertension, postural hypotension on Florinef, CKD, thrombocytopenia admitted for generalized weakness and lethargy started 5:30 PM 02/16/2018. No tPA given due to [place].    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 Ellene Route and he will follow up to repair AVM once pt stable  Resultant lethargy, nystagmus, right arm ataxia, respiratory distress  CT head cerebellar ICH with SDH  Repeat CT showed decreased right cerebellum and vermis ICH, stable right posterior fossa SDH  MRI with and without contrast - evolving ICH of the right cerebellum and cerebellar vermis. Small acute left cerebellar infarct adjacent to the cerebellar hemorrhage. Small left cerebellar AVM, presumably the source of hemorrhage.  CTA head and neck concerning left cerebellar AVM - Dr. Ellene Route will take care of it once pt stable  CT repeat 02/24/18 - evolving cerebellar hematoma, stable in size compared with initial CT.  No hydrocephalus  2D Echo  Left ventricular ejection fraction 55-60%.  LDL 76  HgbA1c 5.5  SCDs for VTE prophylaxis  NPO  No antithrombotic prior to admission, now on No antithrombotic.   Ongoing aggressive stroke risk factor management  Therapy recommendations:  Pending   Disposition:  Pending   Respiratory failure  Intubated on admission  Extubated 02/15/18 - reintubated 02/18/18  Pt still has mild respiratory distress with difficulty handling secretions  On  ventilation  CCM on board  Intermittent sedation  Extubation vs. Trach, will need to decide in the next a couple of days  Cerebral edema  Posterior fossa mass-effect  No hydrocephalus on CT  so far  CT head and neck showed stable hematoma and no hydro  MRI brain 02/17/18 shows stable appearance of the cerebellar hemorrhage and mass effect. As well as small right posterior fossa and right cerebral convexity subdurals. 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 Small left cerebellar AVM  3% saline discontinued  Repeat CT 02/24/2018 stable involving cerebellar hematoma, no hydrocephalus  Sodium 159/151/147  Continue normal saline @ 50  Sodium check every daily  Remove central line today  Aspiration pneumonia  Sputum culture positive for Enterobacter and hemophilia  Blood culture negative  On cefepime  CCM on board  Afebrile   WBC 11.2-10.7  Thrombocytopenia   platelet 147-> 138->76->57->72  Close CBC monitoring  Hypertension . Stable  BP goal < 140  Off Cleviprex   On Norvasc and metoprolol  DC fludrocortisone  Hyperlipidemia  Home meds:  none   LDL 76, goal < 70  No statin at this time  AKI on CKD  Creatinine 1.5->1.5->1.34-2.5-2.38-1.83-1.49  Continue IV fluid  BMP monitoring  Dysphagia  On tube feeding at 70 cc  Speech to follow  Other Stroke Risk Factors  Advanced age  Other Active Problems  Hyperglycemia, improved  Postural hypotension on Florinef at home  Hospital day # 9  This patient is critically ill due to cerebellar ICH, posterior fossa SDH, cerebral edema, respiratory distress hypertensive emergency and at significant risk of neurological worsening, death form hematoma expansion, cerebral edema, brain herniation, seizure, respiratory failure. This patient's care requires constant monitoring of vital signs, hemodynamics, respiratory and cardiac monitoring, review of multiple databases,  neurological assessment, discussion with family, other specialists and medical decision making of high complexity. I spent 35 minutes of neurocritical care time in the care of this patient    Antony Contras, MD Stroke Neurology 02/24/2018 11:46 AM    To contact Stroke Continuity provider, please refer to [email or web address] After hours, contact General Neurology

## 2018-02-25 DIAGNOSIS — N179 Acute kidney failure, unspecified: Secondary | ICD-10-CM

## 2018-02-25 LAB — GLUCOSE, CAPILLARY
Glucose-Capillary: 116 mg/dL — ABNORMAL HIGH (ref 70–99)
Glucose-Capillary: 116 mg/dL — ABNORMAL HIGH (ref 70–99)
Glucose-Capillary: 118 mg/dL — ABNORMAL HIGH (ref 70–99)
Glucose-Capillary: 125 mg/dL — ABNORMAL HIGH (ref 70–99)

## 2018-02-25 LAB — SODIUM: SODIUM: 146 mmol/L — AB (ref 135–145)

## 2018-02-25 MED ORDER — BISACODYL 10 MG RE SUPP
10.0000 mg | Freq: Every day | RECTAL | Status: DC | PRN
  Administered 2018-02-26 – 2018-02-27 (×3): 10 mg via RECTAL
  Filled 2018-02-25 (×3): qty 1

## 2018-02-25 MED ORDER — SENNOSIDES-DOCUSATE SODIUM 8.6-50 MG PO TABS
1.0000 | ORAL_TABLET | Freq: Two times a day (BID) | ORAL | Status: DC
  Administered 2018-02-25 – 2018-02-28 (×8): 1
  Filled 2018-02-25 (×7): qty 1

## 2018-02-25 MED ORDER — METOPROLOL TARTRATE 50 MG PO TABS
50.0000 mg | ORAL_TABLET | Freq: Two times a day (BID) | ORAL | Status: DC
  Administered 2018-02-25 – 2018-02-28 (×5): 50 mg
  Filled 2018-02-25 (×6): qty 1

## 2018-02-25 MED ORDER — CHLORHEXIDINE GLUCONATE 0.12 % MT SOLN
15.0000 mL | Freq: Two times a day (BID) | OROMUCOSAL | Status: DC
  Administered 2018-02-25 – 2018-02-27 (×6): 15 mL via OROMUCOSAL

## 2018-02-25 MED ORDER — HEPARIN SODIUM (PORCINE) 5000 UNIT/ML IJ SOLN
5000.0000 [IU] | Freq: Three times a day (TID) | INTRAMUSCULAR | Status: DC
  Administered 2018-02-25 – 2018-02-28 (×10): 5000 [IU] via SUBCUTANEOUS
  Filled 2018-02-25 (×10): qty 1

## 2018-02-25 MED ORDER — POLYETHYLENE GLYCOL 3350 17 G PO PACK
17.0000 g | PACK | Freq: Every day | ORAL | Status: DC
  Administered 2018-02-25 – 2018-02-28 (×4): 17 g via ORAL
  Filled 2018-02-25 (×4): qty 1

## 2018-02-25 MED ORDER — BISACODYL 10 MG RE SUPP
10.0000 mg | Freq: Once | RECTAL | Status: AC
  Administered 2018-02-25: 10 mg via RECTAL
  Filled 2018-02-25: qty 1

## 2018-02-25 MED ORDER — QUETIAPINE FUMARATE 25 MG PO TABS
25.0000 mg | ORAL_TABLET | Freq: Every day | ORAL | Status: DC
  Administered 2018-02-25 – 2018-02-28 (×4): 25 mg
  Filled 2018-02-25 (×4): qty 1

## 2018-02-25 NOTE — Progress Notes (Signed)
Patient ID: Marvin Mendez, male   DOB: 1945-08-03, 72 y.o.   MRN: [id number] No acute events overnight  Intubated, eyes open spontaneously, FCx4 reliably, nods his head yes and no to questions.  No change in neurosurgical plan of care at this time. Dr. Ellene Route to determine timing of catheter angiogram when he returns on Monday.

## 2018-02-25 NOTE — Progress Notes (Addendum)
STROKE TEAM PROGRESS NOTE   SUBJECTIVE (INTERVAL HISTORY) No family is at the bedside. He remains intubated but off sedation, awake alert on weaning trial. Plan for extubation today.   OBJECTIVE Temp:  [98.7 F (37.1 C)-99.3 F (37.4 C)] 99.3 F (37.4 C) (12/15 0800) Pulse Rate:  [66-94] 84 (12/15 0833) Cardiac Rhythm: Normal sinus rhythm (12/15 0800) Resp:  [16-30] 26 (12/15 0833) BP: (100-155)/(62-81) 134/81 (12/15 0833) SpO2:  [96 %-100 %] 100 % (12/15 0833) FiO2 (%):  [40 %] 40 % (12/15 0833) Weight:  [98.5 kg] 98.5 kg (12/15 0500)  Recent Labs  Lab 02/24/18 1711 02/24/18 2011 02/25/18 0001 02/25/18 0350 02/25/18 0813  GLUCAP 120* 122* 125* 118* 116*   Recent Labs  Lab 02/20/18 0500 02/21/18 0649 02/21/18 1700 02/22/18 0449 02/22/18 1653 02/23/18 0500 02/24/18 0500 02/25/18 0225  NA 161* 159*  --  158*  --  151* 147* 146*  K 3.7 3.7  --  3.5  --  3.5 4.3  --   CL >130* >130*  --  129*  --  123* 118*  --   CO2 20* 20*  --  21*  --  21* 23  --   GLUCOSE 122* 147*  --  146*  --  141* 131*  --   BUN 37* 50*  --  51*  --  39* 32*  --   CREATININE 2.21* 2.50*  --  2.38*  --  1.83* 1.49*  --   CALCIUM 8.8* 8.5*  --  7.8*  --  7.9* 7.8*  --   MG 2.0 2.1 2.0 2.1 2.2 2.0 2.1  --   PHOS 2.0* 1.3* 3.1 2.8 2.6 2.3* 2.0*  --    Recent Labs  Lab 02/19/18 1009  AST 48*  ALT 27  ALKPHOS 52  BILITOT 1.3*  PROT 6.9  ALBUMIN 2.8*   Recent Labs  Lab 02/19/18 1009 02/20/18 0500 02/21/18 0649 02/22/18 0449 02/23/18 0500 02/24/18 0500  WBC 8.7 9.6 10.4 11.7* 11.2* 10.6*  NEUTROABS 6.8  --  7.8*  --   --   --   HGB 12.0* 11.7* 10.7* 10.2* 9.9* 9.8*  HCT 37.8* 38.4* 35.2* 33.2* 32.5* 31.1*  MCV 95.2 97.7 98.9 98.5 97.9 95.7  PLT 84* 76* 61* 57* 62* 72*   No results for input(s): CKTOTAL, CKMB, CKMBINDEX, TROPONINI in the last 168 hours. No results for input(s): LABPROT, INR in the last 72 hours. No results for input(s): COLORURINE, LABSPEC, [place], GLUCOSEU,  HGBUR, BILIRUBINUR, KETONESUR, PROTEINUR, UROBILINOGEN, NITRITE, LEUKOCYTESUR in the last 72 hours.  Invalid input(s): APPERANCEUR     Component Value Date/Time   CHOL 134 02/16/2018 0408   TRIG 74 02/16/2018 0408   HDL 43 02/16/2018 0408   CHOLHDL 3.1 02/16/2018 0408   VLDL 15 02/16/2018 0408   LDLCALC 76 02/16/2018 0408   Lab Results  Component Value Date   HGBA1C 5.5 02/13/2018   No results found for: LABOPIA, COCAINSCRNUR, LABBENZ, AMPHETMU, THCU, LABBARB  No results for input(s): ETH in the last 168 hours.  I have personally reviewed the radiological images below and agree with the radiology interpretations.  Ct Angio Head W Or Wo Contrast  Result Date: 02/15/2018 CLINICAL DATA:  72 y/o  M; follow-up of intracranial hemorrhage. EXAM: CT ANGIOGRAPHY HEAD AND NECK TECHNIQUE: Multidetector CT imaging of the head and neck was performed using the standard protocol during bolus administration of intravenous contrast. Multiplanar CT image reconstructions and MIPs were obtained to evaluate the vascular  anatomy. Carotid stenosis measurements (when applicable) are obtained utilizing NASCET criteria, using the distal internal carotid diameter as the denominator. CONTRAST:  76mL ISOVUE-370 IOPAMIDOL (ISOVUE-370) INJECTION 76% COMPARISON:  02/11/2018 CT head. FINDINGS: CTA NECK FINDINGS Aortic arch: Standard branching. Imaged portion shows no evidence of aneurysm or dissection. No significant stenosis of the major arch vessel origins. Right carotid system: No evidence of dissection, stenosis (50% or greater) or occlusion. Left carotid system: No evidence of dissection, stenosis (50% or greater) or occlusion. Vertebral arteries: Codominant. No evidence of dissection, stenosis (50% or greater) or occlusion. Skeleton: Mild-to-moderate cervical spondylosis with multilevel disc and facet degenerative changes. No high-grade bony canal stenosis. Other neck: Negative. Upper chest: Negative. Review of the MIP  images confirms the above findings CTA HEAD FINDINGS Anterior circulation: No significant stenosis, proximal occlusion, aneurysm, or vascular malformation. Posterior circulation: Mild stenosis of the left vertebral artery at the left PICA origin. The left PICA asymmetrically enlarged with a branch extending to a cluster of vessels within the posteromedial aspect of the left cerebellar hemisphere spanning approximately 11 mm (series 12, image 157-158 and series 13, image 105, also see sagittal MIPS). The right vertebral, basilar, and bilateral posterior cerebral arteries are unremarkable. Venous sinuses: Hyperdense prominent venous structure within the medial cerebellum, possibly a draining vein from the left cerebellar vascular malformation (series 12, image 22). Anatomic variants: None significant. Delayed phase: No abnormal intracranial enhancement. Review of the MIP images confirms the above findings IMPRESSION: CTA head: 1. Small group of abnormal vessels within the posteromedial aspect of left cerebellar hemisphere spanning approximately 11 mm, suspected arteriovenous malformation. 2. Mild stenosis of left vertebral artery and PICA origin. 3. No additional aneurysm, stenosis, vascular malformation, or occlusion of the anterior and posterior circulation. CTA neck: Patent carotid and vertebral arteries of the neck. No dissection, aneurysm, or hemodynamically significant stenosis by NASCET criteria. These results will be called to the ordering clinician or representative by the Radiologist Assistant, and communication documented in the PACS or zVision Dashboard. Electronically Signed   By: Kristine Garbe M.D.   On: 02/15/2018 19:17   Ct Head Wo Contrast  Result Date: 02/24/2018 CLINICAL DATA:  Follow up intracranial hemorrhage. EXAM: CT HEAD WITHOUT CONTRAST TECHNIQUE: Contiguous axial images were obtained from the base of the skull through the vertex without intravenous contrast. COMPARISON:  CT  HEAD February 18, 2018 FINDINGS: BRAIN: Evolving central cerebellar 2.9 x 3.1 cm hematoma, appearing larger than prior CT though, overall less dense. Regional mass effect and potential fourth ventricle intra ventricular extension. Small to moderate volume predominately supratentorial subarachnoid hemorrhage. Trace dense falcotentorial subdural hematoma. Effaced suprasellar cistern. No hydrocephalus. No supratentorial parenchymal hemorrhage, midline shift or acute large vascular territory infarct. Patchy supratentorial white matter hypodensities compatible with mild chronic small vessel ischemic changes, normal for age. VASCULAR: Trace calcific atherosclerosis of the carotid siphons. SKULL: No skull fracture. No significant scalp soft tissue swelling. SINUSES/ORBITS: Mild paranasal sinus mucosal thickening. Mastoid air cells are well aerated.The included ocular globes and orbital contents are non-suspicious. OTHER: None. IMPRESSION: 1. Evolving subacute cerebellar hematoma appears larger than prior examination. Suspected intraventricular extension. Small to moderate presumably redistributed subarachnoid hemorrhage with trace falcotentorial subdural hematoma. 2. Similar upward cerebellar herniation and mass effect on fourth ventricle without hydrocephalus. Electronically Signed   By: Elon Alas M.D.   On: 02/24/2018 05:21   Ct Head Wo Contrast  Result Date: 02/18/2018 CLINICAL DATA:  Follow-up examination for intracranial hemorrhage. EXAM: CT HEAD WITHOUT CONTRAST TECHNIQUE: Contiguous  axial images were obtained from the base of the skull through the vertex without intravenous contrast. COMPARISON:  Prior CT from 03/10/2018. FINDINGS: Brain: Involving central cerebellar hemorrhage decreased in size now measuring 2.5 x 2.7 x 2.5 cm (8.4 cc), previously 2.8 x 3.3 x 3.3 cm (16 cc). Subdural extension with small right parafalcine and tentorial hematoma again noted, similar. Subdural hematoma at the right  posterior fossa little interval changed measuring up to 4-5 mm. Persistent localized mass effect with effacement of the suprasellar cistern. Mass effect on the adjacent fourth ventricle which remains partially effaced, relatively similar. No hydrocephalus. Trace subarachnoid hemorrhage within the right temporal occipital region persist. No other new acute intracranial abnormality. No acute large vessel territory infarct. Vascular: No hyperdense vessel. Skull: Scalp soft tissues and calvarium demonstrate no acute finding. Sinuses/Orbits: Globes and orbital soft tissues within normal limits. Scattered mucosal thickening throughout the ethmoidal air cells. Paranasal sinuses are otherwise grossly clear. No mastoid effusion. Other: None. IMPRESSION: 1. Interval decrease in size of evolving acute central cerebellar hematoma (8 cc, previously 16 cc). Persistent regional mass effect without obstructive hydrocephalus. 2. Persistent small subdural hematoma overlying the right posterior fossa and along the falx and tentorium without mass effect, similar to previous. Persistent trace subarachnoid hemorrhage. Electronically Signed   By: Jeannine Boga M.D.   On: 02/18/2018 03:44   Ct Head Wo Contrast  Result Date: 02/15/2018 CLINICAL DATA:  Altered mental status. Follow-up intracranial hemorrhage. EXAM: CT HEAD WITHOUT CONTRAST TECHNIQUE: Contiguous axial images were obtained from the base of the skull through the vertex without intravenous contrast. COMPARISON:  CT HEAD February 14, 2018 at 1815 hours FINDINGS: BRAIN: Central cerebellar 2.8 x 3.3 x 3.3 cm (volume = 16 cc) intraparenchymal hematoma was 30 cc. Subdural extension with decreased 2-3 mm falcotentorial subdural hematoma. RIGHT posterior fossa subdural hematoma measuring to 4 mm tracking into the included cervical spine. Effaced supra cerebellar cistern. Regional mass effect resulting in narrowed fourth ventricle without hydrocephalus. Trace subarachnoid  hemorrhage. No acute large vascular territory infarct. VASCULAR: Trace calcific atherosclerosis of the carotid siphons. SKULL: No skull fracture. No significant scalp soft tissue swelling. SINUSES/ORBITS: Mild paranasal sinus mucosal thickening. Mastoid air cells are well aerated.The included ocular globes and orbital contents are non-suspicious. OTHER: None. IMPRESSION: 1. Evolving acute central cerebellar hematoma (16 cc, previously 30 cc). Regional mass effect without obstructive hydrocephalus. 2. Decreased falcotentorial and RIGHT posterior fossa subdural hematomas extending into included cervical spine. Small volume subarachnoid hemorrhage. Electronically Signed   By: Elon Alas M.D.   On: 02/15/2018 01:16   Ct Angio Neck W Or Wo Contrast  Result Date: 02/15/2018 CLINICAL DATA:  72 y/o  M; follow-up of intracranial hemorrhage. EXAM: CT ANGIOGRAPHY HEAD AND NECK TECHNIQUE: Multidetector CT imaging of the head and neck was performed using the standard protocol during bolus administration of intravenous contrast. Multiplanar CT image reconstructions and MIPs were obtained to evaluate the vascular anatomy. Carotid stenosis measurements (when applicable) are obtained utilizing NASCET criteria, using the distal internal carotid diameter as the denominator. CONTRAST:  37mL ISOVUE-370 IOPAMIDOL (ISOVUE-370) INJECTION 76% COMPARISON:  03/09/2018 CT head. FINDINGS: CTA NECK FINDINGS Aortic arch: Standard branching. Imaged portion shows no evidence of aneurysm or dissection. No significant stenosis of the major arch vessel origins. Right carotid system: No evidence of dissection, stenosis (50% or greater) or occlusion. Left carotid system: No evidence of dissection, stenosis (50% or greater) or occlusion. Vertebral arteries: Codominant. No evidence of dissection, stenosis (50% or greater) or  occlusion. Skeleton: Mild-to-moderate cervical spondylosis with multilevel disc and facet degenerative changes. No  high-grade bony canal stenosis. Other neck: Negative. Upper chest: Negative. Review of the MIP images confirms the above findings CTA HEAD FINDINGS Anterior circulation: No significant stenosis, proximal occlusion, aneurysm, or vascular malformation. Posterior circulation: Mild stenosis of the left vertebral artery at the left PICA origin. The left PICA asymmetrically enlarged with a branch extending to a cluster of vessels within the posteromedial aspect of the left cerebellar hemisphere spanning approximately 11 mm (series 12, image 157-158 and series 13, image 105, also see sagittal MIPS). The right vertebral, basilar, and bilateral posterior cerebral arteries are unremarkable. Venous sinuses: Hyperdense prominent venous structure within the medial cerebellum, possibly a draining vein from the left cerebellar vascular malformation (series 12, image 22). Anatomic variants: None significant. Delayed phase: No abnormal intracranial enhancement. Review of the MIP images confirms the above findings IMPRESSION: CTA head: 1. Small group of abnormal vessels within the posteromedial aspect of left cerebellar hemisphere spanning approximately 11 mm, suspected arteriovenous malformation. 2. Mild stenosis of left vertebral artery and PICA origin. 3. No additional aneurysm, stenosis, vascular malformation, or occlusion of the anterior and posterior circulation. CTA neck: Patent carotid and vertebral arteries of the neck. No dissection, aneurysm, or hemodynamically significant stenosis by NASCET criteria. These results will be called to the ordering clinician or representative by the Radiologist Assistant, and communication documented in the PACS or zVision Dashboard. Electronically Signed   By: Kristine Garbe M.D.   On: 02/15/2018 19:17   Mr Jeri Cos DZ Contrast  Result Date: 02/17/2018 CLINICAL DATA:  Follow-up exam for intracranial hemorrhage. EXAM: MRI HEAD WITHOUT AND WITH CONTRAST TECHNIQUE: Multiplanar,  multiecho pulse sequences of the brain and surrounding structures were obtained without and with intravenous contrast. CONTRAST:  90 cc of Gadavist. COMPARISON:  Comparison made with prior CTA from 02/15/2018 as well as previous CTs from 03/02/2018. FINDINGS: Brain: Intraparenchymal hemorrhage involving the cerebellar vermis and right cerebellar hemisphere again seen, relatively stable in size measuring approximately 3.6 x 2.9 x 3.2 cm. Localized edema with regional mass effect similar as well. Partial effacement of the fourth ventricle and fourth ventricular outflow tract anteriorly which remain patent. Ventricular size is stable without hydrocephalus or ventricular trapping. Slight asymmetry of the lateral ventricles with the left larger than the right noted, likely congenital. Mild mass effect on the brainstem anteriorly without frank compression. No transtentorial herniation. Probable enhancing tangle of vessels at the adjacent posterior left cerebellum suspicious for AVM (series 18, image 16, also seen on prior CTA. No other abnormal enhancement seen underlying the hematoma. Right posterior fossa of subdural hematoma relatively stable measuring up to 4 mm in thickness. Additional small subdural collection overlying the posterior right cerebral convexity measures up to 2 mm without mass effect, likely small volume subdural hemorrhage and/or reactive hygroma. Scattered small volume subarachnoid hemorrhage present within the underlying right temporal occipital region (series 13, image 30). Smooth dural enhancement overlying the right cerebral convexity likely reactive. 9 mm acute ischemic left cerebellar infarct seen adjacent to the hematoma (series 5, image 58). No other evidence for acute ischemia. Underlying cerebral volume normal. Mild chronic microvascular ischemic changes noted within the periventricular white matter. No other areas of chronic infarction. No mass lesion or abnormal enhancement elsewhere  within the brain. Vascular: Major intracranial vascular flow voids maintained left cerebellar AVM measures approximately 13 x 18 mm (series 12, image 7). Skull and upper cervical spine: Craniocervical junction within normal limits.  Upper cervical spine normal. Bone marrow signal intensity normal. No scalp soft tissue abnormality. Sinuses/Orbits: Globes and orbital soft tissues within normal limits. Mild scattered mucosal thickening throughout the ethmoidal air cells. Paranasal sinuses are otherwise clear. Trace right mastoid effusion noted, of doubtful significance. Inner ear structures normal. Other: None. IMPRESSION: 1. No significant interval change in size and appearance of evolving intraparenchymal hemorrhage involving the right cerebellum and cerebellar vermis. Similar regional mass effect without hydrocephalus or herniation. 2. Associated small right posterior fossa and posterior right cerebral convexity subdural hematoma without significant mass effect, stable. Persistent small volume underlying subarachnoid hemorrhage. 3. 9 mm acute ischemic nonhemorrhagic left cerebellar infarct adjacent to the cerebellar hemorrhage. 4. Small left cerebellar AVM, presumably the source of hemorrhage. Electronically Signed   By: Jeannine Boga M.D.   On: 02/17/2018 03:39     PHYSICAL EXAM  Temp:  [98.7 F (37.1 C)-99.3 F (37.4 C)] 99.3 F (37.4 C) (12/15 0800) Pulse Rate:  [66-94] 84 (12/15 0833) Resp:  [16-30] 26 (12/15 0833) BP: (100-155)/(62-81) 134/81 (12/15 0833) SpO2:  [96 %-100 %] 100 % (12/15 0833) FiO2 (%):  [40 %] 40 % (12/15 0833) Weight:  [98.5 kg] 98.5 kg (12/15 0500)  General - Well nourished, well developed elderly African-American male,who is intubated and off sedation   Ophthalmologic - fundi not visualized due to noncooperation.  Cardiovascular - Regular rate and rhythm.  Neuro - he is still intubated but off sedation, eyes open and follow all simple commands.   PERRL, blink to  visual threat bilaterally. Left gaze no difficulty, right gaze right eye incomplete abduction, making eyes disconjugate on right gaze. Facial symmetry not able to test due to ET tube, tongue midline. BUE 4/5 and BLE 3/5, symmetrical. Sensation, coordination not cooperative but seems to have BUE ataxia on movement, gait not tested.   ASSESSMENT/PLAN Mr. Marvin Mendez is a 72 y.o. male with history of hypertension, postural hypotension on Florinef, CKD, thrombocytopenia admitted for generalized weakness and lethargy started 5:30 PM 03/13/2018. No tPA given due to [place].    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 Ellene Route and he will follow up to repair AVM once pt stable  Resultant lethargy, nystagmus, right arm ataxia, respiratory distress  CT head cerebellar ICH with SDH  Repeat CT showed decreased right cerebellum and vermis ICH, stable right posterior fossa SDH  MRI with and without contrast - evolving ICH of the right cerebellum and cerebellar vermis. Small acute left cerebellar infarct adjacent to the cerebellar hemorrhage. Small left cerebellar AVM, presumably the source of hemorrhage.  CTA head and neck concerning left cerebellar AVM - Dr. Ellene Route will take care of it once pt stable  CT repeat 02/24/18 - evolving cerebellar hematoma, stable in size compared with initial CT.  No hydrocephalus  2D Echo  Left ventricular ejection fraction 55-60%.  LDL 76  HgbA1c 5.5  Heparin subq for VTE prophylaxis  NPO on TF  No antithrombotic prior to admission, now on No antithrombotic.   Ongoing aggressive stroke risk factor management  Therapy recommendations:  Pending   Disposition:  Pending   Respiratory failure  Intubated on admission  Extubated 02/15/18 - reintubated 02/18/18  Pt still intubated but on weaning trial today  CCM on board  Plan for extubation today  Cerebral  edema  Posterior fossa mass-effect  No hydrocephalus on CT   CT head and  neck showed stable hematoma and no hydro  MRI brain 02/17/18 shows stable appearance of the cerebellar hemorrhage and mass effect. As well as small right posterior fossa and right cerebral convexity subdurals. 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 Small left cerebellar AVM  3% saline discontinued  Repeat CT 02/24/2018 stable involving cerebellar hematoma, no hydrocephalus  Sodium 159/151/147->146  On free water and TF, d/c NS infusion  Removed central line 02/24/18  Aspiration pneumonia  Sputum culture positive for Enterobacter and hemophilia  Blood culture negative  On cefepime  CCM on board  Afebrile   WBC 11.2-10.6  Thrombocytopenia   platelet 147-> 138->76->57->72  Close CBC monitoring  Hypertension . Stable  BP goal < 160  Off Cleviprex   Labetalol PRN  On Norvasc and metoprolol  DC fludrocortisone  Hyperlipidemia  Home meds:  none   LDL 76, goal < 70  No statin at this time  AKI on CKD  Creatinine 1.5->1.5->1.34-2.5-2.38-1.83-1.49  Continue free water and TF, d/c NS infusion  BMP monitoring  Dysphagia  On tube feeding at 70 cc  Speech to follow once extubated  Other Stroke Risk Factors  Advanced age  Other Active Problems  Hyperglycemia, improved  Postural hypotension on Florinef at home  Hospital day # 10  This patient is critically ill due to cerebellar ICH, posterior fossa SDH, cerebral edema, respiratory distress hypertensive emergency and at significant risk of neurological worsening, death form hematoma expansion, cerebral edema, brain herniation, seizure, respiratory failure. This patient's care requires constant monitoring of vital signs, hemodynamics, respiratory and cardiac monitoring, review of multiple databases, neurological assessment, discussion with family, other specialists and medical decision making of high  complexity. I spent 35 minutes of neurocritical care time in the care of this patient    Rosalin Hawking, MD PhD Stroke Neurology 02/25/2018 10:53 AM     To contact Stroke Continuity provider, please refer to [email or web address] After hours, contact General Neurology

## 2018-02-25 NOTE — Progress Notes (Addendum)
NAME:  Marvin Mendez, MRN:  [id number], DOB:  1945-08-30, LOS: 53 ADMISSION DATE:  02/28/2018, CONSULTATION DATE:  12/5 REFERRING MD:  Ellender Hose, CHIEF COMPLAINT:  ICH   Brief History   72 y/o male with intracerebellar ICH on 12/5, intubated for airway protection.  Extubated 12/5 but re-intubated for aspiraiton on 12/9.  Past Medical History  HTN, CKD 3, anemia (b12 deficient), thrombocytopenia, and autonomic postural hypotension (on florinef). Family reports he is a very active man. Works out several times per week. Is very strong and independent.   Significant Hospital Events   12/05 Admitted 12/09 Reintubated. Limited CODE BLUE 12/13 Changed code status back to full code 12/15 Ongoing increased secretions, HME changed 3 times overnight  Consults:  Neurosurgery - 12/5 Neurology - 12/5  Procedures:  ETT 12/4 > 12/5, 12/9 >>  Significant Diagnostic Tests:  CT head 12/4 >> acute IPH in the central cerebellum with estimated volume 29cc. Assocaited edema and mass effect with probable early herniation. Subarachnoid extension. Possible intraventricular extension into fourth ventricle.   CT head 12/5 >> with decreased central cerebellar hemorrhage to 15 cc.  CT Head 12/8 >> Interval decrease in size of evolving acute central cerebellar hematoma. Persistent regional mass effect without obstructive hydrocephalus, Persistent SDH  Micro Data:  12/9  sputum >> hemophilus, enterobacter 12/9  blood cultures x 2 >> neg  Antimicrobials:  Vanco 12/9 > 12/11 Cefepime 12/9 >  Interim history/subjective:  Increased secretions overnight, HME changed 3x per RT.  No BM per RN.   Objective   Blood pressure 134/81, pulse 84, temperature 99.3 F (37.4 C), temperature source Axillary, resp. rate (!) 26, height 6' (1.829 m), weight 98.5 kg, SpO2 100 %.    Vent Mode: PSV;CPAP FiO2 (%):  [40 %] 40 % Set Rate:  [18 bmp] 18 bmp Vt Set:  [620 mL] 620 mL PEEP:  [5 cmH20] 5 cmH20 Pressure Support:   [5 cmH20] 5 cmH20 Plateau Pressure:  [13 cmH20-15 cmH20] 15 cmH20   Intake/Output Summary (Last 24 hours) at 02/25/2018 0930 Last data filed at 02/25/2018 0800 Gross per 24 hour  Intake 2648.73 ml  Output 700 ml  Net 1948.73 ml   Filed Weights   02/23/18 0500 02/24/18 0432 02/25/18 0500  Weight: 94 kg 97.8 kg 98.5 kg    Examination: General: elderly male lying in bed on vent HEENT: MM pink/moist, ETT, thick secretions noted in ballard tubing  Neuro: Awake, alert, attempts to follow commands, no movement on RUE CV: s1s2 rrr, no m/r/g PULM: even/non-labored, lungs bilaterally clear  OJ:JKKX, non-tender, bsx4 active  Extremities: warm/dry, trace edema  Skin: no rashes or lesions  Resolved Hospital Problem list     Assessment & Plan:   ICH: cerebellar bleed with subarachnoid and possibly intraventricular extension Acute encephalopathy -off 3%NS  P: Per Neurology  Continue seroquel  PRN versed / fentanyl for pain / sedation  Acute respiratory failure  Hospital associated pneumonia, aspiration P: PRVC with daily SBT / WUA  D7/7 abx, stop date added  Duoneb Q6 PRN   CKD III: hypernatremia/hyperchloremia P: Trend BMP / urinary output Replace electrolytes as indicated Avoid nephrotoxic agents, ensure adequate renal perfusion Free water 274ml Q6   Hypertension  P: Continue metoprolol, norvasc PRN labetalol   Postural hypotension P: Monitor off florinef   Hyperglycemia without DM P: SSI, sensitive scale   Thrombocytopenia from critical illness P: Trend CBC  Monitor for bleeding   Constipation P: Senokot-S PT BID  Add Dulcolax  now, then daily PRN  Add miralax daily   Best practice:  Diet: TF Pain/Anxiety/Delirium protocol (if indicated): in place VAP protocol (if indicated): yes DVT prophylaxis: SCD GI prophylaxis: PPI Glucose control: SSI Mobility: n/a Code Status: Full code.   Family Communication: No family at bedside am 12/15.  Updated  daughter, grandson 12/14.  Disposition: ICU  Labs   CBC: Recent Labs  Lab 02/19/18 1009 02/20/18 0500 02/21/18 0649 02/22/18 0449 02/23/18 0500 02/24/18 0500  WBC 8.7 9.6 10.4 11.7* 11.2* 10.6*  NEUTROABS 6.8  --  7.8*  --   --   --   HGB 12.0* 11.7* 10.7* 10.2* 9.9* 9.8*  HCT 37.8* 38.4* 35.2* 33.2* 32.5* 31.1*  MCV 95.2 97.7 98.9 98.5 97.9 95.7  PLT 84* 76* 61* 57* 62* 72*    Basic Metabolic Panel: Recent Labs  Lab 02/20/18 0500 02/21/18 0649 02/21/18 1700 02/22/18 0449 02/22/18 1653 02/23/18 0500 02/24/18 0500 02/25/18 0225  NA 161* 159*  --  158*  --  151* 147* 146*  K 3.7 3.7  --  3.5  --  3.5 4.3  --   CL >130* >130*  --  129*  --  123* 118*  --   CO2 20* 20*  --  21*  --  21* 23  --   GLUCOSE 122* 147*  --  146*  --  141* 131*  --   BUN 37* 50*  --  51*  --  39* 32*  --   CREATININE 2.21* 2.50*  --  2.38*  --  1.83* 1.49*  --   CALCIUM 8.8* 8.5*  --  7.8*  --  7.9* 7.8*  --   MG 2.0 2.1 2.0 2.1 2.2 2.0 2.1  --   PHOS 2.0* 1.3* 3.1 2.8 2.6 2.3* 2.0*  --    GFR: Estimated Creatinine Clearance: 52.9 mL/min (A) (by C-G formula based on SCr of 1.49 mg/dL (H)). Recent Labs  Lab 02/19/18 1009 02/20/18 0500 02/21/18 0649 02/22/18 0449 02/23/18 0500 02/24/18 0500  PROCALCITON 0.12 0.25 0.35  --   --   --   WBC 8.7 9.6 10.4 11.7* 11.2* 10.6*  LATICACIDVEN 1.0  --   --   --   --   --     Liver Function Tests: Recent Labs  Lab 02/19/18 1009  AST 48*  ALT 27  ALKPHOS 52  BILITOT 1.3*  PROT 6.9  ALBUMIN 2.8*   No results for input(s): LIPASE, AMYLASE in the last 168 hours. No results for input(s): AMMONIA in the last 168 hours.  ABG    Component Value Date/Time   PHART 7.327 (L) 02/19/2018 1335   PCO2ART 46.9 02/19/2018 1335   PO2ART 452.0 (H) 02/19/2018 1335   HCO3 24.6 02/19/2018 1335   TCO2 26 02/19/2018 1335   ACIDBASEDEF 1.0 02/19/2018 1335   O2SAT 100.0 02/19/2018 1335     Coagulation Profile: No results for input(s): INR, PROTIME in  the last 168 hours.  Cardiac Enzymes: No results for input(s): CKTOTAL, CKMB, CKMBINDEX, TROPONINI in the last 168 hours.  HbA1C: Hgb A1c MFr Bld  Date/Time Value Ref Range Status  02/28/2018 11:49 PM 5.5 4.8 - 5.6 % Final    Comment:    (NOTE) Pre diabetes:          5.7%-6.4% Diabetes:              >6.4% Glycemic control for   <7.0% adults with diabetes     CBG: Recent Labs  Lab 02/24/18 1711 02/24/18 2011 02/25/18 0001 02/25/18 0350 02/25/18 0813  GLUCAP 120* 122* 125* 118* 116*     Critical care time: 30 minutes    Noe Gens, NP-C [facility] Pgr: [phone] or if no answer [phone] 02/25/2018, 9:30 AM

## 2018-02-26 ENCOUNTER — Inpatient Hospital Stay (HOSPITAL_COMMUNITY): Payer: PPO

## 2018-02-26 DIAGNOSIS — K449 Diaphragmatic hernia without obstruction or gangrene: Secondary | ICD-10-CM

## 2018-02-26 DIAGNOSIS — K5900 Constipation, unspecified: Secondary | ICD-10-CM

## 2018-02-26 LAB — CBC
HCT: 30 % — ABNORMAL LOW (ref 39.0–52.0)
Hemoglobin: 9.5 g/dL — ABNORMAL LOW (ref 13.0–17.0)
MCH: 30.1 pg (ref 26.0–34.0)
MCHC: 31.7 g/dL (ref 30.0–36.0)
MCV: 94.9 fL (ref 80.0–100.0)
Platelets: 109 10*3/uL — ABNORMAL LOW (ref 150–400)
RBC: 3.16 MIL/uL — ABNORMAL LOW (ref 4.22–5.81)
RDW: 13.5 % (ref 11.5–15.5)
WBC: 7.2 10*3/uL (ref 4.0–10.5)
nRBC: 0 % (ref 0.0–0.2)

## 2018-02-26 LAB — BASIC METABOLIC PANEL
Anion gap: 9 (ref 5–15)
BUN: 29 mg/dL — ABNORMAL HIGH (ref 8–23)
CO2: 20 mmol/L — ABNORMAL LOW (ref 22–32)
Calcium: 7.9 mg/dL — ABNORMAL LOW (ref 8.9–10.3)
Chloride: 114 mmol/L — ABNORMAL HIGH (ref 98–111)
Creatinine, Ser: 1.54 mg/dL — ABNORMAL HIGH (ref 0.61–1.24)
GFR calc Af Amer: 51 mL/min — ABNORMAL LOW (ref >60)
GFR, EST NON AFRICAN AMERICAN: 44 mL/min — AB (ref >60)
Glucose, Bld: 130 mg/dL — ABNORMAL HIGH (ref 70–99)
Potassium: 3.8 mmol/L (ref 3.5–5.1)
SODIUM: 143 mmol/L (ref 135–145)

## 2018-02-26 LAB — GLUCOSE, CAPILLARY
Glucose-Capillary: 85 mg/dL (ref 70–99)
Glucose-Capillary: 91 mg/dL (ref 70–99)

## 2018-02-26 MED ORDER — IOPAMIDOL (ISOVUE-300) INJECTION 61%
INTRAVENOUS | Status: AC
  Filled 2018-02-26: qty 50

## 2018-02-26 MED ORDER — LIDOCAINE VISCOUS HCL 2 % MT SOLN
OROMUCOSAL | Status: AC
  Administered 2018-02-26: 3 mL via OROMUCOSAL
  Filled 2018-02-26: qty 15

## 2018-02-26 MED ORDER — LIDOCAINE VISCOUS HCL 2 % MT SOLN
15.0000 mL | Freq: Once | OROMUCOSAL | Status: AC
  Administered 2018-02-26: 3 mL via OROMUCOSAL
  Filled 2018-02-26: qty 15

## 2018-02-26 MED ORDER — PANTOPRAZOLE SODIUM 40 MG PO PACK
40.0000 mg | PACK | Freq: Every day | ORAL | Status: DC
  Administered 2018-02-26 – 2018-02-28 (×3): 40 mg
  Filled 2018-02-26 (×3): qty 20

## 2018-02-26 MED ORDER — IOPAMIDOL (ISOVUE-300) INJECTION 61%
50.0000 mL | Freq: Once | INTRAVENOUS | Status: AC | PRN
  Administered 2018-02-26: 20 mL

## 2018-02-26 NOTE — Procedures (Signed)
Cortrak  Person Inserting Tube:  Marvin Mendez, RD Tube Type:  Cortrak - 43 inches Tube Location:  Right nare Initial Placement:  Stomach Secured by: Bridle Technique Used to Measure Tube Placement:  Documented cm marking at nare/ corner of mouth Cortrak Secured At:  57 cm    Cortrak Tube Team Note:  Consult received to place a Cortrak feeding tube.   X-ray is required, abdominal x-ray has been ordered by the Cortrak team. Please confirm tube placement before using the Cortrak tube.   If the tube becomes dislodged please keep the tube and contact the Cortrak team at [email or web address] (password [email or web address]) for replacement.  If after hours and replacement cannot be delayed, place a NG tube and confirm placement with an abdominal x-ray.    Gaynell Face, MS, RD, LDN Inpatient Clinical Dietitian Pager: [phone] Weekend/After Hours: [phone]

## 2018-02-26 NOTE — Progress Notes (Signed)
Pt transported on vent to IR and returned to 1Y60 without complications.

## 2018-02-26 NOTE — Progress Notes (Signed)
NAME:  Marvin Mendez, MRN:  [id number], DOB:  02-20-1946, LOS: 63 ADMISSION DATE:  02/26/2018, CONSULTATION DATE:  12/5 REFERRING MD:  Ellender Hose, CHIEF COMPLAINT:  ICH   Brief History   72 y/o male with intracerebellar ICH on 12/5, intubated for airway protection.  Extubated 12/5 but re-intubated for aspiraiton on 12/9.  Past Medical History  HTN, CKD 3, anemia (b12 deficient), thrombocytopenia, and autonomic postural hypotension (on florinef). Family reports he is a very active man. Works out several times per week. Is very strong and independent.   Significant Hospital Events   12/05 Admitted 12/09 Reintubated. Limited CODE BLUE 12/13 Changed code status back to full code 12/15 Ongoing increased secretions, HME changed 3 times overnight; constipated. Aggressive bowel regimen started.  12/16 abd softer after disimpactment by nursing staff. Passing SBT.   Consults:  Neurosurgery - 12/5 Neurology - 12/5  Procedures:  ETT 12/4 > 12/5, 12/9 >>  Significant Diagnostic Tests:  CT head 12/4 >> acute IPH in the central cerebellum with estimated volume 29cc. Assocaited edema and mass effect with probable early herniation. Subarachnoid extension. Possible intraventricular extension into fourth ventricle.   CT head 12/5 >> with decreased central cerebellar hemorrhage to 15 cc.  CT Head 12/8 >> Interval decrease in size of evolving acute central cerebellar hematoma. Persistent regional mass effect without obstructive hydrocephalus, Persistent SDH  Micro Data:  12/9  sputum >> hemophilus, enterobacter 12/9  blood cultures x 2 >> neg  Antimicrobials:  Vanco 12/9 > 12/11 Cefepime 12/9 >12/15  Interim history/subjective:  No distress looks comfortable on SBT  Objective   Blood pressure 126/71, pulse 71, temperature 98.4 F (36.9 C), temperature source Axillary, resp. rate 20, height 6' (1.829 m), weight 103.5 kg, SpO2 100 %.    Vent Mode: PRVC FiO2 (%):  [40 %] 40 % Set Rate:  [18  bmp] 18 bmp Vt Set:  [620 mL] 620 mL PEEP:  [5 cmH20] 5 cmH20 Pressure Support:  [5 cmH20] 5 cmH20 Plateau Pressure:  [20 cmH20-24 cmH20] 20 cmH20   Intake/Output Summary (Last 24 hours) at 02/26/2018 0824 Last data filed at 02/26/2018 0600 Gross per 24 hour  Intake 2842.72 ml  Output 1790 ml  Net 1052.72 ml   Filed Weights   02/24/18 0432 02/25/18 0500 02/26/18 0347  Weight: 97.8 kg 98.5 kg 103.5 kg    Examination:  General 72 year old AAM resting on PSV. Briskly follows commands. He is in no distress HENT NCAT orally intubated. No JVD Pulm clear Vts 600-700s on PSV 5 cmH2O.  Card RRR no MRG abd still firm but softer when comparing day prior + bowel sounds LBM on 12/15 GU clear yellow  Neuro awake, nods approp. Follows commands. A EXT brisk CR UE dependent edema + pulses   Resolved Hospital Problem list   Aspiration PNA (7 days cefepime completed 12/15) Postural Hypotension  Assessment & Plan:   ICH: cerebellar bleed with subarachnoid and possibly intraventricular extension Acute encephalopathy -off 3%NS  Plan  Cont Seroquel at current dosing Stop PRN versed PRN fent for pain (reducing dosing given constipation)  Acute respiratory failure w/ on-going ventilator dependence  -failed extubation X 1; now completed rx for aspiration PNA -barriers to extubation at this point: mental status, +/- decreased abd compliance d/t constipation  PCXR personally reviewed. Still showing sig elevated L HD and L basilar atx w/ sig large bowel -passing SBT  Plan Place Cor trak  Extubate today/ if fails will need trach Aspiration and  reflux precautions Mobilize Cont to rx bowels  CKD III w/ fluid and electrolyte imbalance: hypernatremia/hyperchloremia ->improved Na and Cl ->cr bumped a little Plan Cont current free water  Renal dose as appropriate  Am chem  Hypertension  Plan Cont current lopressor and norvasc  Hyperglycemia without DM Plan ssi   Thrombocytopenia  from critical illness Platelets have actually improved some Plan Trend cbc  Constipation -SS and LOC added 12/15 w/ some results s/p being disimpacted.  Plan Cont daily bowel regimen  PRN SSE  Best practice:  Diet: TF; held since 12/15 for constipation and trial of extubation.  Pain/Anxiety/Delirium protocol (if indicated): in place VAP protocol (if indicated): yes DVT prophylaxis: SCD GI prophylaxis: PPI Glucose control: SSI Mobility: n/a Code Status: Full code.   Family Communication: No family at bedside am 12/15.updated sister  He remains critically ill w/ need for constant titration of therapies while assessing readiness to wean/extubate. He is currently on SBT and is passing. I have spoke w/ his sister and will plan on extubation later this am after Cor Trak placed. If he were to fail extubation he will require trach    Critical care time: 33 minutes      Erick Colace ACNP-BC [facility] Pager # [phone] OR # [phone] if no answer

## 2018-02-26 NOTE — Progress Notes (Signed)
STROKE TEAM PROGRESS NOTE   SUBJECTIVE (INTERVAL HISTORY) Daughter and Dr. Milus Height and RN are at the bedside. He remains intubated but off sedation, awake alert, still has copious secretions and has not had bowel movement yet. Not able to be extubated today. Neuro stable  OBJECTIVE Temp:  [97.8 F (36.6 C)-99.6 F (37.6 C)] 98.3 F (36.8 C) (12/16 0800) Pulse Rate:  [65-87] 85 (12/16 1449) Cardiac Rhythm: Normal sinus rhythm (12/16 0800) Resp:  [2-31] 31 (12/16 1449) BP: (94-162)/(57-104) 162/84 (12/16 1400) SpO2:  [95 %-100 %] 100 % (12/16 1449) FiO2 (%):  [40 %] 40 % (12/16 1449) Weight:  [103.5 kg] 103.5 kg (12/16 0347)  Recent Labs  Lab 02/24/18 2011 02/25/18 0001 02/25/18 0350 02/25/18 0813 02/25/18 1207  GLUCAP 122* 125* 118* 116* 116*   Recent Labs  Lab 02/21/18 0649 02/21/18 1700 02/22/18 0449 02/22/18 1653 02/23/18 0500 02/24/18 0500 02/25/18 0225 02/26/18 0436  NA 159*  --  158*  --  151* 147* 146* 143  K 3.7  --  3.5  --  3.5 4.3  --  3.8  CL >130*  --  129*  --  123* 118*  --  114*  CO2 20*  --  21*  --  21* 23  --  20*  GLUCOSE 147*  --  146*  --  141* 131*  --  130*  BUN 50*  --  51*  --  39* 32*  --  29*  CREATININE 2.50*  --  2.38*  --  1.83* 1.49*  --  1.54*  CALCIUM 8.5*  --  7.8*  --  7.9* 7.8*  --  7.9*  MG 2.1 2.0 2.1 2.2 2.0 2.1  --   --   PHOS 1.3* 3.1 2.8 2.6 2.3* 2.0*  --   --    No results for input(s): AST, ALT, ALKPHOS, BILITOT, PROT, ALBUMIN in the last 168 hours. Recent Labs  Lab 02/21/18 0649 02/22/18 0449 02/23/18 0500 02/24/18 0500 02/26/18 0436  WBC 10.4 11.7* 11.2* 10.6* 7.2  NEUTROABS 7.8*  --   --   --   --   HGB 10.7* 10.2* 9.9* 9.8* 9.5*  HCT 35.2* 33.2* 32.5* 31.1* 30.0*  MCV 98.9 98.5 97.9 95.7 94.9  PLT 61* 57* 62* 72* 109*   No results for input(s): CKTOTAL, CKMB, CKMBINDEX, TROPONINI in the last 168 hours. No results for input(s): LABPROT, INR in the last 72 hours. No results for input(s): COLORURINE,  LABSPEC, [place], GLUCOSEU, HGBUR, BILIRUBINUR, KETONESUR, PROTEINUR, UROBILINOGEN, NITRITE, LEUKOCYTESUR in the last 72 hours.  Invalid input(s): APPERANCEUR     Component Value Date/Time   CHOL 134 02/16/2018 0408   TRIG 74 02/16/2018 0408   HDL 43 02/16/2018 0408   CHOLHDL 3.1 02/16/2018 0408   VLDL 15 02/16/2018 0408   LDLCALC 76 02/16/2018 0408   Lab Results  Component Value Date   HGBA1C 5.5 02/24/2018   No results found for: LABOPIA, COCAINSCRNUR, LABBENZ, AMPHETMU, THCU, LABBARB  No results for input(s): ETH in the last 168 hours.  I have personally reviewed the radiological images below and agree with the radiology interpretations.  Ct Angio Head W Or Wo Contrast  Result Date: 02/15/2018 CLINICAL DATA:  72 y/o  M; follow-up of intracranial hemorrhage. EXAM: CT ANGIOGRAPHY HEAD AND NECK TECHNIQUE: Multidetector CT imaging of the head and neck was performed using the standard protocol during bolus administration of intravenous contrast. Multiplanar CT image reconstructions and MIPs were obtained to evaluate the vascular  anatomy. Carotid stenosis measurements (when applicable) are obtained utilizing NASCET criteria, using the distal internal carotid diameter as the denominator. CONTRAST:  85mL ISOVUE-370 IOPAMIDOL (ISOVUE-370) INJECTION 76% COMPARISON:  03/10/2018 CT head. FINDINGS: CTA NECK FINDINGS Aortic arch: Standard branching. Imaged portion shows no evidence of aneurysm or dissection. No significant stenosis of the major arch vessel origins. Right carotid system: No evidence of dissection, stenosis (50% or greater) or occlusion. Left carotid system: No evidence of dissection, stenosis (50% or greater) or occlusion. Vertebral arteries: Codominant. No evidence of dissection, stenosis (50% or greater) or occlusion. Skeleton: Mild-to-moderate cervical spondylosis with multilevel disc and facet degenerative changes. No high-grade bony canal stenosis. Other neck: Negative. Upper chest:  Negative. Review of the MIP images confirms the above findings CTA HEAD FINDINGS Anterior circulation: No significant stenosis, proximal occlusion, aneurysm, or vascular malformation. Posterior circulation: Mild stenosis of the left vertebral artery at the left PICA origin. The left PICA asymmetrically enlarged with a branch extending to a cluster of vessels within the posteromedial aspect of the left cerebellar hemisphere spanning approximately 11 mm (series 12, image [id number] and series 13, image 105, also see sagittal MIPS). The right vertebral, basilar, and bilateral posterior cerebral arteries are unremarkable. Venous sinuses: Hyperdense prominent venous structure within the medial cerebellum, possibly a draining vein from the left cerebellar vascular malformation (series 12, image 22). Anatomic variants: None significant. Delayed phase: No abnormal intracranial enhancement. Review of the MIP images confirms the above findings IMPRESSION: CTA head: 1. Small group of abnormal vessels within the posteromedial aspect of left cerebellar hemisphere spanning approximately 11 mm, suspected arteriovenous malformation. 2. Mild stenosis of left vertebral artery and PICA origin. 3. No additional aneurysm, stenosis, vascular malformation, or occlusion of the anterior and posterior circulation. CTA neck: Patent carotid and vertebral arteries of the neck. No dissection, aneurysm, or hemodynamically significant stenosis by NASCET criteria. These results will be called to the ordering clinician or representative by the Radiologist Assistant, and communication documented in the PACS or zVision Dashboard. Electronically Signed   By: Kristine Garbe M.D.   On: 02/15/2018 19:17   Ct Head Wo Contrast  Result Date: 02/24/2018 CLINICAL DATA:  Follow up intracranial hemorrhage. EXAM: CT HEAD WITHOUT CONTRAST TECHNIQUE: Contiguous axial images were obtained from the base of the skull through the vertex without intravenous  contrast. COMPARISON:  CT HEAD February 18, 2018 FINDINGS: BRAIN: Evolving central cerebellar 2.9 x 3.1 cm hematoma, appearing larger than prior CT though, overall less dense. Regional mass effect and potential fourth ventricle intra ventricular extension. Small to moderate volume predominately supratentorial subarachnoid hemorrhage. Trace dense falcotentorial subdural hematoma. Effaced suprasellar cistern. No hydrocephalus. No supratentorial parenchymal hemorrhage, midline shift or acute large vascular territory infarct. Patchy supratentorial white matter hypodensities compatible with mild chronic small vessel ischemic changes, normal for age. VASCULAR: Trace calcific atherosclerosis of the carotid siphons. SKULL: No skull fracture. No significant scalp soft tissue swelling. SINUSES/ORBITS: Mild paranasal sinus mucosal thickening. Mastoid air cells are well aerated.The included ocular globes and orbital contents are non-suspicious. OTHER: None. IMPRESSION: 1. Evolving subacute cerebellar hematoma appears larger than prior examination. Suspected intraventricular extension. Small to moderate presumably redistributed subarachnoid hemorrhage with trace falcotentorial subdural hematoma. 2. Similar upward cerebellar herniation and mass effect on fourth ventricle without hydrocephalus. Electronically Signed   By: Elon Alas M.D.   On: 02/24/2018 05:21   Ct Head Wo Contrast  Result Date: 02/18/2018 CLINICAL DATA:  Follow-up examination for intracranial hemorrhage. EXAM: CT HEAD WITHOUT CONTRAST TECHNIQUE: Contiguous  axial images were obtained from the base of the skull through the vertex without intravenous contrast. COMPARISON:  Prior CT from 02/25/2018. FINDINGS: Brain: Involving central cerebellar hemorrhage decreased in size now measuring 2.5 x 2.7 x 2.5 cm (8.4 cc), previously 2.8 x 3.3 x 3.3 cm (16 cc). Subdural extension with small right parafalcine and tentorial hematoma again noted, similar. Subdural  hematoma at the right posterior fossa little interval changed measuring up to 4-5 mm. Persistent localized mass effect with effacement of the suprasellar cistern. Mass effect on the adjacent fourth ventricle which remains partially effaced, relatively similar. No hydrocephalus. Trace subarachnoid hemorrhage within the right temporal occipital region persist. No other new acute intracranial abnormality. No acute large vessel territory infarct. Vascular: No hyperdense vessel. Skull: Scalp soft tissues and calvarium demonstrate no acute finding. Sinuses/Orbits: Globes and orbital soft tissues within normal limits. Scattered mucosal thickening throughout the ethmoidal air cells. Paranasal sinuses are otherwise grossly clear. No mastoid effusion. Other: None. IMPRESSION: 1. Interval decrease in size of evolving acute central cerebellar hematoma (8 cc, previously 16 cc). Persistent regional mass effect without obstructive hydrocephalus. 2. Persistent small subdural hematoma overlying the right posterior fossa and along the falx and tentorium without mass effect, similar to previous. Persistent trace subarachnoid hemorrhage. Electronically Signed   By: Jeannine Boga M.D.   On: 02/18/2018 03:44   Ct Head Wo Contrast  Result Date: 02/15/2018 CLINICAL DATA:  Altered mental status. Follow-up intracranial hemorrhage. EXAM: CT HEAD WITHOUT CONTRAST TECHNIQUE: Contiguous axial images were obtained from the base of the skull through the vertex without intravenous contrast. COMPARISON:  CT HEAD February 14, 2018 at 1815 hours FINDINGS: BRAIN: Central cerebellar 2.8 x 3.3 x 3.3 cm (volume = 16 cc) intraparenchymal hematoma was 30 cc. Subdural extension with decreased 2-3 mm falcotentorial subdural hematoma. RIGHT posterior fossa subdural hematoma measuring to 4 mm tracking into the included cervical spine. Effaced supra cerebellar cistern. Regional mass effect resulting in narrowed fourth ventricle without hydrocephalus.  Trace subarachnoid hemorrhage. No acute large vascular territory infarct. VASCULAR: Trace calcific atherosclerosis of the carotid siphons. SKULL: No skull fracture. No significant scalp soft tissue swelling. SINUSES/ORBITS: Mild paranasal sinus mucosal thickening. Mastoid air cells are well aerated.The included ocular globes and orbital contents are non-suspicious. OTHER: None. IMPRESSION: 1. Evolving acute central cerebellar hematoma (16 cc, previously 30 cc). Regional mass effect without obstructive hydrocephalus. 2. Decreased falcotentorial and RIGHT posterior fossa subdural hematomas extending into included cervical spine. Small volume subarachnoid hemorrhage. Electronically Signed   By: Elon Alas M.D.   On: 02/15/2018 01:16   Ct Angio Neck W Or Wo Contrast  Result Date: 02/15/2018 CLINICAL DATA:  72 y/o  M; follow-up of intracranial hemorrhage. EXAM: CT ANGIOGRAPHY HEAD AND NECK TECHNIQUE: Multidetector CT imaging of the head and neck was performed using the standard protocol during bolus administration of intravenous contrast. Multiplanar CT image reconstructions and MIPs were obtained to evaluate the vascular anatomy. Carotid stenosis measurements (when applicable) are obtained utilizing NASCET criteria, using the distal internal carotid diameter as the denominator. CONTRAST:  32mL ISOVUE-370 IOPAMIDOL (ISOVUE-370) INJECTION 76% COMPARISON:  03/07/2018 CT head. FINDINGS: CTA NECK FINDINGS Aortic arch: Standard branching. Imaged portion shows no evidence of aneurysm or dissection. No significant stenosis of the major arch vessel origins. Right carotid system: No evidence of dissection, stenosis (50% or greater) or occlusion. Left carotid system: No evidence of dissection, stenosis (50% or greater) or occlusion. Vertebral arteries: Codominant. No evidence of dissection, stenosis (50% or greater) or  occlusion. Skeleton: Mild-to-moderate cervical spondylosis with multilevel disc and facet degenerative  changes. No high-grade bony canal stenosis. Other neck: Negative. Upper chest: Negative. Review of the MIP images confirms the above findings CTA HEAD FINDINGS Anterior circulation: No significant stenosis, proximal occlusion, aneurysm, or vascular malformation. Posterior circulation: Mild stenosis of the left vertebral artery at the left PICA origin. The left PICA asymmetrically enlarged with a branch extending to a cluster of vessels within the posteromedial aspect of the left cerebellar hemisphere spanning approximately 11 mm (series 12, image 157-158 and series 13, image 105, also see sagittal MIPS). The right vertebral, basilar, and bilateral posterior cerebral arteries are unremarkable. Venous sinuses: Hyperdense prominent venous structure within the medial cerebellum, possibly a draining vein from the left cerebellar vascular malformation (series 12, image 22). Anatomic variants: None significant. Delayed phase: No abnormal intracranial enhancement. Review of the MIP images confirms the above findings IMPRESSION: CTA head: 1. Small group of abnormal vessels within the posteromedial aspect of left cerebellar hemisphere spanning approximately 11 mm, suspected arteriovenous malformation. 2. Mild stenosis of left vertebral artery and PICA origin. 3. No additional aneurysm, stenosis, vascular malformation, or occlusion of the anterior and posterior circulation. CTA neck: Patent carotid and vertebral arteries of the neck. No dissection, aneurysm, or hemodynamically significant stenosis by NASCET criteria. These results will be called to the ordering clinician or representative by the Radiologist Assistant, and communication documented in the PACS or zVision Dashboard. Electronically Signed   By: Kristine Garbe M.D.   On: 02/15/2018 19:17   Mr Jeri Cos HE Contrast  Result Date: 02/17/2018 CLINICAL DATA:  Follow-up exam for intracranial hemorrhage. EXAM: MRI HEAD WITHOUT AND WITH CONTRAST TECHNIQUE:  Multiplanar, multiecho pulse sequences of the brain and surrounding structures were obtained without and with intravenous contrast. CONTRAST:  90 cc of Gadavist. COMPARISON:  Comparison made with prior CTA from 02/15/2018 as well as previous CTs from 02/26/2018. FINDINGS: Brain: Intraparenchymal hemorrhage involving the cerebellar vermis and right cerebellar hemisphere again seen, relatively stable in size measuring approximately 3.6 x 2.9 x 3.2 cm. Localized edema with regional mass effect similar as well. Partial effacement of the fourth ventricle and fourth ventricular outflow tract anteriorly which remain patent. Ventricular size is stable without hydrocephalus or ventricular trapping. Slight asymmetry of the lateral ventricles with the left larger than the right noted, likely congenital. Mild mass effect on the brainstem anteriorly without frank compression. No transtentorial herniation. Probable enhancing tangle of vessels at the adjacent posterior left cerebellum suspicious for AVM (series 18, image 16, also seen on prior CTA. No other abnormal enhancement seen underlying the hematoma. Right posterior fossa of subdural hematoma relatively stable measuring up to 4 mm in thickness. Additional small subdural collection overlying the posterior right cerebral convexity measures up to 2 mm without mass effect, likely small volume subdural hemorrhage and/or reactive hygroma. Scattered small volume subarachnoid hemorrhage present within the underlying right temporal occipital region (series 13, image 30). Smooth dural enhancement overlying the right cerebral convexity likely reactive. 9 mm acute ischemic left cerebellar infarct seen adjacent to the hematoma (series 5, image 58). No other evidence for acute ischemia. Underlying cerebral volume normal. Mild chronic microvascular ischemic changes noted within the periventricular white matter. No other areas of chronic infarction. No mass lesion or abnormal enhancement  elsewhere within the brain. Vascular: Major intracranial vascular flow voids maintained left cerebellar AVM measures approximately 13 x 18 mm (series 12, image 7). Skull and upper cervical spine: Craniocervical junction within normal limits.  Upper cervical spine normal. Bone marrow signal intensity normal. No scalp soft tissue abnormality. Sinuses/Orbits: Globes and orbital soft tissues within normal limits. Mild scattered mucosal thickening throughout the ethmoidal air cells. Paranasal sinuses are otherwise clear. Trace right mastoid effusion noted, of doubtful significance. Inner ear structures normal. Other: None. IMPRESSION: 1. No significant interval change in size and appearance of evolving intraparenchymal hemorrhage involving the right cerebellum and cerebellar vermis. Similar regional mass effect without hydrocephalus or herniation. 2. Associated small right posterior fossa and posterior right cerebral convexity subdural hematoma without significant mass effect, stable. Persistent small volume underlying subarachnoid hemorrhage. 3. 9 mm acute ischemic nonhemorrhagic left cerebellar infarct adjacent to the cerebellar hemorrhage. 4. Small left cerebellar AVM, presumably the source of hemorrhage. Electronically Signed   By: Jeannine Boga M.D.   On: 02/17/2018 03:39    PHYSICAL EXAM  Temp:  [97.8 F (36.6 C)-99.6 F (37.6 C)] 98.3 F (36.8 C) (12/16 0800) Pulse Rate:  [65-87] 85 (12/16 1449) Resp:  [2-31] 31 (12/16 1449) BP: (94-162)/(57-104) 162/84 (12/16 1400) SpO2:  [95 %-100 %] 100 % (12/16 1449) FiO2 (%):  [40 %] 40 % (12/16 1449) Weight:  [103.5 kg] 103.5 kg (12/16 0347)  General - Well nourished, well developed elderly African-American male,who is intubated and off sedation   Ophthalmologic - fundi not visualized due to noncooperation.  Cardiovascular - Regular rate and rhythm.  Neuro - he is still intubated but off sedation, eyes open and follow all simple commands.   PERRL,  blink to visual threat bilaterally. Left gaze no difficulty, right gaze right eye incomplete abduction, making eyes disconjugate on right gaze. Facial symmetry not able to test due to ET tube, tongue midline. BUE 4/5 and BLE 3/5, symmetrical. Sensation, coordination not cooperative but seems to have BUE ataxia on movement, gait not tested.   ASSESSMENT/PLAN Marvin Mendez is a 72 y.o. male with history of hypertension, postural hypotension on Florinef, CKD, thrombocytopenia admitted for generalized weakness and lethargy started 5:30 PM 02/22/2018. No tPA given due to [place].    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 Ellene Route and he will follow up to repair AVM once pt stable  Resultant lethargy, nystagmus, right arm ataxia, respiratory distress  CT head cerebellar ICH with SDH  Repeat CT showed decreased right cerebellum and vermis ICH, stable right posterior fossa SDH  MRI with and without contrast - evolving ICH of the right cerebellum and cerebellar vermis. Small acute left cerebellar infarct adjacent to the cerebellar hemorrhage. Small left cerebellar AVM, presumably the source of hemorrhage.  CTA head and neck concerning left cerebellar AVM - Dr. Ellene Route will take care of it once pt stable  CT repeat 02/24/18 - evolving cerebellar hematoma, stable in size compared with initial CT.  No hydrocephalus  2D Echo  Left ventricular ejection fraction 55-60%.  LDL 76  HgbA1c 5.5  Heparin subq for VTE prophylaxis  NPO on TF  No antithrombotic prior to admission, now on No antithrombotic.   Ongoing aggressive stroke risk factor management  Therapy recommendations:  Pending   Disposition:  Pending   Respiratory failure  Intubated on admission  Extubated 02/15/18 - reintubated 02/18/18  Pt still intubated due to secretion and constipation  CCM on board  Aspiration pneumonia  Sputum  culture positive for Enterobacter and hemophilia  Blood culture negative  On cefepime  CCM on board  Afebrile   WBC 11.2-10.6->7.2  Thrombocytopenia, improving  platelet 147-> 138->76->57->72->109  Close CBC monitoring  Hypertension . Stable  BP goal < 160  Off Cleviprex   Labetalol PRN  On Norvasc and metoprolol  DC fludrocortisone  Hyperlipidemia  Home meds:  none   LDL 76, goal < 70  No statin at this time  AKI on CKD  Creatinine 1.5->1.5->1.34-2.5-2.38-1.83-1.49->1.54  Continue free water and TF  BMP monitoring  Dysphagia  On tube feeding at 70 cc  Speech to follow once extubated  Other Stroke Risk Factors  Advanced age  Other Active Problems  Hyperglycemia, improved  Postural hypotension on Florinef at home  Constipation - on miralax and [facility] day # 11  This patient is critically ill due to cerebellar ICH, posterior fossa SDH, cerebral edema, respiratory distress hypertensive emergency and at significant risk of neurological worsening, death form hematoma expansion, cerebral edema, brain herniation, seizure, respiratory failure. This patient's care requires constant monitoring of vital signs, hemodynamics, respiratory and cardiac monitoring, review of multiple databases, neurological assessment, discussion with family, other specialists and medical decision making of high complexity. I spent 30 minutes of neurocritical care time in the care of this patient    Rosalin Hawking, MD PhD Stroke Neurology 02/26/2018 3:16 PM     To contact Stroke Continuity provider, please refer to [email or web address] After hours, contact General Neurology

## 2018-02-27 ENCOUNTER — Inpatient Hospital Stay (HOSPITAL_COMMUNITY): Payer: PPO

## 2018-02-27 LAB — BASIC METABOLIC PANEL
ANION GAP: 10 (ref 5–15)
BUN: 27 mg/dL — ABNORMAL HIGH (ref 8–23)
CO2: 19 mmol/L — ABNORMAL LOW (ref 22–32)
Calcium: 7.9 mg/dL — ABNORMAL LOW (ref 8.9–10.3)
Chloride: 114 mmol/L — ABNORMAL HIGH (ref 98–111)
Creatinine, Ser: 1.46 mg/dL — ABNORMAL HIGH (ref 0.61–1.24)
Glucose, Bld: 122 mg/dL — ABNORMAL HIGH (ref 70–99)
Potassium: 3.9 mmol/L (ref 3.5–5.1)
Sodium: 143 mmol/L (ref 135–145)

## 2018-02-27 LAB — POCT I-STAT 3, ART BLOOD GAS (G3+)
Acid-base deficit: 2 mmol/L (ref 0.0–2.0)
Bicarbonate: 21.1 mmol/L (ref 20.0–28.0)
O2 Saturation: 95 %
TCO2: 22 mmol/L (ref 22–32)
pCO2 arterial: 30 mmHg — ABNORMAL LOW (ref 32.0–48.0)
pH, Arterial: 7.455 — ABNORMAL HIGH (ref 7.350–7.450)
pO2, Arterial: 72 mmHg — ABNORMAL LOW (ref 83.0–108.0)

## 2018-02-27 LAB — GLUCOSE, CAPILLARY
GLUCOSE-CAPILLARY: 145 mg/dL — AB (ref 70–99)
GLUCOSE-CAPILLARY: 105 mg/dL — AB (ref 70–99)
Glucose-Capillary: 129 mg/dL — ABNORMAL HIGH (ref 70–99)
Glucose-Capillary: 123 mg/dL — ABNORMAL HIGH (ref 70–99)
Glucose-Capillary: 106 mg/dL — ABNORMAL HIGH (ref 70–99)
Glucose-Capillary: 119 mg/dL — ABNORMAL HIGH (ref 70–99)

## 2018-02-27 MED ORDER — IPRATROPIUM-ALBUTEROL 0.5-2.5 (3) MG/3ML IN SOLN
3.0000 mL | Freq: Four times a day (QID) | RESPIRATORY_TRACT | Status: DC
  Administered 2018-02-27 – 2018-03-01 (×8): 3 mL via RESPIRATORY_TRACT
  Filled 2018-02-27 (×8): qty 3

## 2018-02-27 MED ORDER — ORAL CARE MOUTH RINSE: 15.0000 mL | Freq: Two times a day (BID) | OROMUCOSAL | Status: DC

## 2018-02-27 MED ORDER — FENTANYL CITRATE (PF) 100 MCG/2ML IJ SOLN: 50.0000 ug | Freq: Once | INTRAMUSCULAR | Status: AC

## 2018-02-27 MED ORDER — ORAL CARE MOUTH RINSE
15.0000 mL | OROMUCOSAL | Status: DC
  Administered 2018-02-27 – 2018-03-01 (×11): 15 mL via OROMUCOSAL

## 2018-02-27 MED ORDER — FENTANYL CITRATE (PF) 100 MCG/2ML IJ SOLN: 50.0000 ug | INTRAMUSCULAR | Status: DC | PRN

## 2018-02-27 MED ORDER — ETOMIDATE 2 MG/ML IV SOLN: 0.3000 mg/kg | Freq: Once | INTRAVENOUS | Status: AC

## 2018-02-27 MED ORDER — ROCURONIUM BROMIDE 50 MG/5ML IV SOLN
0.6000 mg/kg | Freq: Once | INTRAVENOUS | Status: AC
  Administered 2018-02-27: 55.9 mg via INTRAVENOUS
  Filled 2018-02-27: qty 5.59

## 2018-02-27 MED ORDER — FENTANYL CITRATE (PF) 100 MCG/2ML IJ SOLN
50.0000 ug | INTRAMUSCULAR | Status: DC | PRN
  Administered 2018-02-28: 50 ug via INTRAVENOUS
  Filled 2018-02-27: qty 2

## 2018-02-27 MED ORDER — DEXMEDETOMIDINE HCL IN NACL 200 MCG/50ML IV SOLN
0.0000 ug/kg/h | INTRAVENOUS | Status: DC
  Administered 2018-02-27: 0.4 ug/kg/h via INTRAVENOUS
  Administered 2018-02-27: 0.5 ug/kg/h via INTRAVENOUS
  Administered 2018-02-28: 0.3 ug/kg/h via INTRAVENOUS
  Administered 2018-02-28 – 2018-03-01 (×2): 0.4 ug/kg/h via INTRAVENOUS
  Filled 2018-02-27: qty 50
  Filled 2018-02-27: qty 100
  Filled 2018-02-27 (×2): qty 50

## 2018-02-27 MED ORDER — FENTANYL CITRATE (PF) 100 MCG/2ML IJ SOLN: 12.5000 ug | INTRAMUSCULAR | Status: DC | PRN

## 2018-02-27 MED ORDER — MIDAZOLAM HCL 2 MG/2ML IJ SOLN
INTRAMUSCULAR | Status: AC
  Administered 2018-02-27: 1 mg
  Filled 2018-02-27: qty 2

## 2018-02-27 MED ORDER — CHLORHEXIDINE GLUCONATE 0.12% ORAL RINSE (MEDLINE KIT)
15.0000 mL | Freq: Two times a day (BID) | OROMUCOSAL | Status: DC
  Administered 2018-02-28 (×2): 15 mL via OROMUCOSAL

## 2018-02-27 MED ORDER — FENTANYL CITRATE (PF) 100 MCG/2ML IJ SOLN
INTRAMUSCULAR | Status: AC
  Administered 2018-02-27: 100 ug
  Filled 2018-02-27: qty 2

## 2018-02-27 MED ORDER — SODIUM CHLORIDE 0.9 % IV SOLN
INTRAVENOUS | Status: DC | PRN
  Administered 2018-02-27: 18:00:00 via INTRAVENOUS

## 2018-02-27 MED ORDER — METOCLOPRAMIDE HCL 5 MG/5ML PO SOLN
10.0000 mg | Freq: Four times a day (QID) | ORAL | Status: DC
  Administered 2018-02-27 – 2018-03-01 (×7): 10 mg
  Filled 2018-02-27 (×9): qty 10

## 2018-02-27 MED ORDER — MIDAZOLAM HCL 2 MG/2ML IJ SOLN: 1.0000 mg | Freq: Once | INTRAMUSCULAR | Status: AC

## 2018-02-27 NOTE — Procedures (Signed)
Intubation Procedure Note Marvin Mendez [id number] 05-09-1945  Procedure: Intubation Indications: Airway protection and maintenance, unable to clear or maintain secretions, and respiratory distress.  Procedure Details Consent: Risks of procedure as well as the alternatives and risks of each were explained to the (patient/caregiver).  Consent for procedure obtained. Time Out: Verified patient identification, verified procedure, site/side was marked, verified correct patient position, special equipment/implants available, medications/allergies/relevent history reviewed, required imaging and test results available.  Performed Preoxygenated for procedure.  Presedated with fentanyl 50 mcg, versed 1. RSI with etomidate 35m and rocuronium 60 mg.    Maximum sterile technique was used including hand hygiene.  MAC 4 glidescope used for procedure.  Grade 1 view.  Evaluation Hemodynamic Status: BP stable throughout; O2 sats: stable throughout Patient's Current Condition: stable Complications: No apparent complications Patient did tolerate procedure well. Chest X-ray ordered to verify placement.  CXR: pending.  Procedure performed under direct supervision of Dr. ALynetta Mare  BKennieth Rad AGACNP-BC [facility] Pgr: [phone] if no answer [phone]/17/2019, 6:09 PM

## 2018-02-27 NOTE — Progress Notes (Addendum)
NAME:  Marvin Mendez, MRN:  [id number], DOB:  May 01, 1945, LOS: 43 ADMISSION DATE:  02/24/2018, CONSULTATION DATE:  12/5 REFERRING MD:  Ellender Hose, CHIEF COMPLAINT:  ICH   Brief History   72 y/o male with intracerebellar ICH on 12/5, intubated for airway protection.  Extubated 12/5 but re-intubated for aspiraiton on 12/9.  Past Medical History  HTN, CKD 3, anemia (b12 deficient), thrombocytopenia, and autonomic postural hypotension (on florinef). Family reports he is a very active man. Works out several times per week. Is very strong and independent.   Significant Hospital Events   12/05 Admitted 12/09 Reintubated. Limited CODE BLUE 12/13 Changed code status back to full code 12/15 Ongoing increased secretions, HME changed 3 times overnight; constipated. Aggressive bowel regimen started.  12/16 abd softer after disimpactment by nursing staff. Passing SBT.   Consults:  Neurosurgery - 12/5 Neurology - 12/5  Procedures:  ETT 12/4 > 12/5, 12/9 >>  Significant Diagnostic Tests:  CT head 12/4 >> acute IPH in the central cerebellum with estimated volume 29cc. Assocaited edema and mass effect with probable early herniation. Subarachnoid extension. Possible intraventricular extension into fourth ventricle.   CT head 12/5 >> with decreased central cerebellar hemorrhage to 15 cc.  CT Head 12/8 >> Interval decrease in size of evolving acute central cerebellar hematoma. Persistent regional mass effect without obstructive hydrocephalus, Persistent SDH  Micro Data:  12/9  sputum >> hemophilus, enterobacter 12/9  blood cultures x 2 >> neg  Antimicrobials:  Vanco 12/9 > 12/11 Cefepime 12/9 >12/15  Interim history/subjective:  Some wheezing after coughing. Vts look good on SBT but wob concerning a little   Objective   Blood pressure 137/74, pulse 81, temperature 98.7 F (37.1 C), temperature source Axillary, resp. rate (Abnormal) 27, height 6' (1.829 m), weight 93.1 kg, SpO2 99 %.    Vent  Mode: PSV;CPAP FiO2 (%):  [40 %] 40 % Set Rate:  [18 bmp] 18 bmp Vt Set:  [620 mL] 620 mL PEEP:  [5 cmH20] 5 cmH20 Pressure Support:  [10 cmH20] 10 cmH20 Plateau Pressure:  [15 cmH20-25 cmH20] 16 cmH20   Intake/Output Summary (Last 24 hours) at 02/27/2018 0903 Last data filed at 02/27/2018 0800 Gross per 24 hour  Intake 2190 ml  Output 1250 ml  Net 940 ml   Filed Weights   02/25/18 0500 02/26/18 0347 02/27/18 0600  Weight: 98.5 kg 103.5 kg 93.1 kg    Examination:  General 72 year old aam resting on pressure support ventilation.  Does exhibit mild accessory use following cough and suction HEENT normocephalic atraumatic orally intubated Pulmonary: Prolonged expiratory wheeze, tidal volume in the 6-700 range on pressure support of 5 however does have accessory use Cardiac regular rate and rhythm Abdomen: Still firm but softer yet again.  Bowel sounds active.  Last bowel movement 12/15 following disimpaction GU: Clear yellow Neuro: Awake, follows commands. Extremities.  Brisk capillary refill upper extremity edema strong pulses.  Resolved Hospital Problem list   Aspiration PNA (7 days cefepime completed 12/15) Postural Hypotension  Assessment & Plan:   ICH: cerebellar bleed with subarachnoid and possibly intraventricular extension Acute encephalopathy -off 3%NS  Plan  Cont Seroquel dosing PRN fent for pain @ reduced dose    Acute respiratory failure w/ on-going ventilator dependence  -failed extubation X 1; now completed rx for aspiration PNA -barriers to extubation at this point: mental status, +/- decreased abd compliance d/t constipation  -secretions improved but does have prolonged exhale phase + wheezing and seemingly difficulty  recovering from exertion  Plan Add duoneb qid Cont to address bowel regimen. Will re-assess later today after SSE for possible extubation Cont PSV as tolerated Hope we can extubate in the last 24-48 hrs.  PAD protocol RASS 0 May need  trach if fails extubation  CKD III w/ fluid and electrolyte imbalance: hypernatremia/hyperchloremia ->improved Na and Cl Plan Cont free water Adjust meds renally  Am chemistry   Hypertension  Plan No change in lopressor and Norvasc dose   Hyperglycemia without DM Plan ssi  Thrombocytopenia from critical illness Platelets have actually improved some Plan Am cbc  Constipation -SS and LOC added 12/15 w/ some results s/p being disimpacted; had BM this am but still abd distended.  Plan SSE this am  Added reglan VT  Best practice:  Diet: TF; held since 12/15 for constipation and trial of extubation.  Pain/Anxiety/Delirium protocol (if indicated): in place VAP protocol (if indicated): yes DVT prophylaxis: SCD GI prophylaxis: PPI Glucose control: SSI Mobility: n/a Code Status: Full code.   Family Communication: No family at bedside am 12/15.updated sister  Disposition: He remains critically ill due to ongoing need for mechanical ventilation, and titration of pressure support to assess for readiness for extubation.  His primary barrier continues to be seemingly his bowels, he has had trouble with increased secretions as well, this appears improved today.  On exam he is wheezing quite a bit so we will add bronchodilators.  He is getting a soapsuds enema this morning.  Yesterday he did not tolerate very much activity above weaning without developing worsening accessory use and work of breathing.  We will reassess him after the enema, hopefully we can extubate him in the next 24 to 48 hours   Critical care time: 85min      Alissa Pharr E Jakalyn Kratky ACNP-BC [place] Pager # [phone] OR # [phone] if no answer

## 2018-02-27 NOTE — Progress Notes (Signed)
Nutrition Follow-up  DOCUMENTATION CODES:   Not applicable  INTERVENTION:   Continue:  Vital AF 1.2 @ 70 ml/hr via Cortrak tube  Provides: 2016 kcal, 126 grams protein, and 1362 ml free water.  Total free water: 2162 ml   NUTRITION DIAGNOSIS:   Inadequate oral intake related to inability to eat as evidenced by NPO status. Ongoing.   GOAL:   Patient will meet greater than or equal to 90% of their needs Met.   MONITOR:   TF tolerance, I & O's  ASSESSMENT:   Pt with PMH of anemia (b12 deficient), CKD stage 3, and HTN admitted from [facility] with [employer] not a candidate for surgery but did receive 3%. He was extubated 12/5 and started on diet. 12/9 possible aspiration event and re-intubated.    Pt discussed during ICU rounds and with RN.  Pt previously failed extubation, being extubated again today Remains constipated but on medications.   12/16 disimpacted by nursing, Cortrak placed by IR into jejunum 12/17 pt extubated, currently having line placed   Medications reviewed: reglan, miralax, senokot-s 200 ml free water every 6 hours = 800 ml Labs reviewed   NUTRITION - FOCUSED PHYSICAL EXAM:  Unable to complete Nutrition-Focused physical exam at this time.    Diet Order:   Diet Order            Diet NPO time specified  Diet effective now              EDUCATION NEEDS:   No education needs have been identified at this time  Skin:  Skin Assessment: Reviewed RN Assessment  Last BM:  12/17  Height:   Ht Readings from Last 1 Encounters:  02/15/18 6' (1.829 m)    Weight:   Wt Readings from Last 1 Encounters:  02/27/18 93.1 kg    Ideal Body Weight:     BMI:  Body mass index is 27.84 kg/m.  Estimated Nutritional Needs:   Kcal:  2000-2200  Protein:  115-130 grams  Fluid:  > 2 L/day  Maylon Peppers RD, LDN, CNSC [phone] Pager [phone] After Hours Pager

## 2018-02-27 NOTE — Progress Notes (Signed)
STROKE TEAM PROGRESS NOTE   SUBJECTIVE (INTERVAL HISTORY) Daughter and grandson are at the bedside. He remains intubated but off sedation, awake alert, has strong cough.  Plan to extubate today.  OBJECTIVE Temp:  [98.2 F (36.8 C)-98.8 F (37.1 C)] 98.6 F (37 C) (12/17 1600) Pulse Rate:  [65-83] 83 (12/17 1700) Cardiac Rhythm: Normal sinus rhythm (12/17 1600) Resp:  [17-31] 29 (12/17 1700) BP: (93-137)/(52-86) 130/70 (12/17 1700) SpO2:  [94 %-100 %] 99 % (12/17 1700) FiO2 (%):  [40 %] 40 % (12/17 1034) Weight:  [93.1 kg] 93.1 kg (12/17 0600)  Recent Labs  Lab 02/27/18 0102 02/27/18 0320 02/27/18 0809 02/27/18 1129 02/27/18 1553  GLUCAP 129* 123* 145* 105* 106*   Recent Labs  Lab 02/21/18 1700 02/22/18 0449 02/22/18 1653 02/23/18 0500 02/24/18 0500 02/25/18 0225 02/26/18 0436 02/27/18 0435  NA  --  158*  --  151* 147* 146* 143 143  K  --  3.5  --  3.5 4.3  --  3.8 3.9  CL  --  129*  --  123* 118*  --  114* 114*  CO2  --  21*  --  21* 23  --  20* 19*  GLUCOSE  --  146*  --  141* 131*  --  130* 122*  BUN  --  51*  --  39* 32*  --  29* 27*  CREATININE  --  2.38*  --  1.83* 1.49*  --  1.54* 1.46*  CALCIUM  --  7.8*  --  7.9* 7.8*  --  7.9* 7.9*  MG 2.0 2.1 2.2 2.0 2.1  --   --   --   PHOS 3.1 2.8 2.6 2.3* 2.0*  --   --   --    No results for input(s): AST, ALT, ALKPHOS, BILITOT, PROT, ALBUMIN in the last 168 hours. Recent Labs  Lab 02/21/18 0649 02/22/18 0449 02/23/18 0500 02/24/18 0500 02/26/18 0436  WBC 10.4 11.7* 11.2* 10.6* 7.2  NEUTROABS 7.8*  --   --   --   --   HGB 10.7* 10.2* 9.9* 9.8* 9.5*  HCT 35.2* 33.2* 32.5* 31.1* 30.0*  MCV 98.9 98.5 97.9 95.7 94.9  PLT 61* 57* 62* 72* 109*   No results for input(s): CKTOTAL, CKMB, CKMBINDEX, TROPONINI in the last 168 hours. No results for input(s): LABPROT, INR in the last 72 hours. No results for input(s): COLORURINE, LABSPEC, [place], GLUCOSEU, HGBUR, BILIRUBINUR, KETONESUR, PROTEINUR, UROBILINOGEN,  NITRITE, LEUKOCYTESUR in the last 72 hours.  Invalid input(s): APPERANCEUR     Component Value Date/Time   CHOL 134 02/16/2018 0408   TRIG 74 02/16/2018 0408   HDL 43 02/16/2018 0408   CHOLHDL 3.1 02/16/2018 0408   VLDL 15 02/16/2018 0408   LDLCALC 76 02/16/2018 0408   Lab Results  Component Value Date   HGBA1C 5.5 02/12/2018   No results found for: LABOPIA, COCAINSCRNUR, LABBENZ, AMPHETMU, THCU, LABBARB  No results for input(s): ETH in the last 168 hours.  I have personally reviewed the radiological images below and agree with the radiology interpretations.  Ct Angio Head W Or Wo Contrast  Result Date: 02/15/2018 CLINICAL DATA:  72 y/o  M; follow-up of intracranial hemorrhage. EXAM: CT ANGIOGRAPHY HEAD AND NECK TECHNIQUE: Multidetector CT imaging of the head and neck was performed using the standard protocol during bolus administration of intravenous contrast. Multiplanar CT image reconstructions and MIPs were obtained to evaluate the vascular anatomy. Carotid stenosis measurements (when applicable) are obtained utilizing NASCET  criteria, using the distal internal carotid diameter as the denominator. CONTRAST:  63mL ISOVUE-370 IOPAMIDOL (ISOVUE-370) INJECTION 76% COMPARISON:  02/20/2018 CT head. FINDINGS: CTA NECK FINDINGS Aortic arch: Standard branching. Imaged portion shows no evidence of aneurysm or dissection. No significant stenosis of the major arch vessel origins. Right carotid system: No evidence of dissection, stenosis (50% or greater) or occlusion. Left carotid system: No evidence of dissection, stenosis (50% or greater) or occlusion. Vertebral arteries: Codominant. No evidence of dissection, stenosis (50% or greater) or occlusion. Skeleton: Mild-to-moderate cervical spondylosis with multilevel disc and facet degenerative changes. No high-grade bony canal stenosis. Other neck: Negative. Upper chest: Negative. Review of the MIP images confirms the above findings CTA HEAD FINDINGS  Anterior circulation: No significant stenosis, proximal occlusion, aneurysm, or vascular malformation. Posterior circulation: Mild stenosis of the left vertebral artery at the left PICA origin. The left PICA asymmetrically enlarged with a branch extending to a cluster of vessels within the posteromedial aspect of the left cerebellar hemisphere spanning approximately 11 mm (series 12, image [id number] and series 13, image 105, also see sagittal MIPS). The right vertebral, basilar, and bilateral posterior cerebral arteries are unremarkable. Venous sinuses: Hyperdense prominent venous structure within the medial cerebellum, possibly a draining vein from the left cerebellar vascular malformation (series 12, image 22). Anatomic variants: None significant. Delayed phase: No abnormal intracranial enhancement. Review of the MIP images confirms the above findings IMPRESSION: CTA head: 1. Small group of abnormal vessels within the posteromedial aspect of left cerebellar hemisphere spanning approximately 11 mm, suspected arteriovenous malformation. 2. Mild stenosis of left vertebral artery and PICA origin. 3. No additional aneurysm, stenosis, vascular malformation, or occlusion of the anterior and posterior circulation. CTA neck: Patent carotid and vertebral arteries of the neck. No dissection, aneurysm, or hemodynamically significant stenosis by NASCET criteria. These results will be called to the ordering clinician or representative by the Radiologist Assistant, and communication documented in the PACS or zVision Dashboard. Electronically Signed   By: Kristine Garbe M.D.   On: 02/15/2018 19:17   Ct Head Wo Contrast  Result Date: 02/24/2018 CLINICAL DATA:  Follow up intracranial hemorrhage. EXAM: CT HEAD WITHOUT CONTRAST TECHNIQUE: Contiguous axial images were obtained from the base of the skull through the vertex without intravenous contrast. COMPARISON:  CT HEAD February 18, 2018 FINDINGS: BRAIN: Evolving central  cerebellar 2.9 x 3.1 cm hematoma, appearing larger than prior CT though, overall less dense. Regional mass effect and potential fourth ventricle intra ventricular extension. Small to moderate volume predominately supratentorial subarachnoid hemorrhage. Trace dense falcotentorial subdural hematoma. Effaced suprasellar cistern. No hydrocephalus. No supratentorial parenchymal hemorrhage, midline shift or acute large vascular territory infarct. Patchy supratentorial white matter hypodensities compatible with mild chronic small vessel ischemic changes, normal for age. VASCULAR: Trace calcific atherosclerosis of the carotid siphons. SKULL: No skull fracture. No significant scalp soft tissue swelling. SINUSES/ORBITS: Mild paranasal sinus mucosal thickening. Mastoid air cells are well aerated.The included ocular globes and orbital contents are non-suspicious. OTHER: None. IMPRESSION: 1. Evolving subacute cerebellar hematoma appears larger than prior examination. Suspected intraventricular extension. Small to moderate presumably redistributed subarachnoid hemorrhage with trace falcotentorial subdural hematoma. 2. Similar upward cerebellar herniation and mass effect on fourth ventricle without hydrocephalus. Electronically Signed   By: Elon Alas M.D.   On: 02/24/2018 05:21   Ct Head Wo Contrast  Result Date: 02/18/2018 CLINICAL DATA:  Follow-up examination for intracranial hemorrhage. EXAM: CT HEAD WITHOUT CONTRAST TECHNIQUE: Contiguous axial images were obtained from the base of the skull  through the vertex without intravenous contrast. COMPARISON:  Prior CT from 02/17/2018. FINDINGS: Brain: Involving central cerebellar hemorrhage decreased in size now measuring 2.5 x 2.7 x 2.5 cm (8.4 cc), previously 2.8 x 3.3 x 3.3 cm (16 cc). Subdural extension with small right parafalcine and tentorial hematoma again noted, similar. Subdural hematoma at the right posterior fossa little interval changed measuring up to 4-5 mm.  Persistent localized mass effect with effacement of the suprasellar cistern. Mass effect on the adjacent fourth ventricle which remains partially effaced, relatively similar. No hydrocephalus. Trace subarachnoid hemorrhage within the right temporal occipital region persist. No other new acute intracranial abnormality. No acute large vessel territory infarct. Vascular: No hyperdense vessel. Skull: Scalp soft tissues and calvarium demonstrate no acute finding. Sinuses/Orbits: Globes and orbital soft tissues within normal limits. Scattered mucosal thickening throughout the ethmoidal air cells. Paranasal sinuses are otherwise grossly clear. No mastoid effusion. Other: None. IMPRESSION: 1. Interval decrease in size of evolving acute central cerebellar hematoma (8 cc, previously 16 cc). Persistent regional mass effect without obstructive hydrocephalus. 2. Persistent small subdural hematoma overlying the right posterior fossa and along the falx and tentorium without mass effect, similar to previous. Persistent trace subarachnoid hemorrhage. Electronically Signed   By: Jeannine Boga M.D.   On: 02/18/2018 03:44   Ct Head Wo Contrast  Result Date: 02/15/2018 CLINICAL DATA:  Altered mental status. Follow-up intracranial hemorrhage. EXAM: CT HEAD WITHOUT CONTRAST TECHNIQUE: Contiguous axial images were obtained from the base of the skull through the vertex without intravenous contrast. COMPARISON:  CT HEAD February 14, 2018 at 1815 hours FINDINGS: BRAIN: Central cerebellar 2.8 x 3.3 x 3.3 cm (volume = 16 cc) intraparenchymal hematoma was 30 cc. Subdural extension with decreased 2-3 mm falcotentorial subdural hematoma. RIGHT posterior fossa subdural hematoma measuring to 4 mm tracking into the included cervical spine. Effaced supra cerebellar cistern. Regional mass effect resulting in narrowed fourth ventricle without hydrocephalus. Trace subarachnoid hemorrhage. No acute large vascular territory infarct. VASCULAR:  Trace calcific atherosclerosis of the carotid siphons. SKULL: No skull fracture. No significant scalp soft tissue swelling. SINUSES/ORBITS: Mild paranasal sinus mucosal thickening. Mastoid air cells are well aerated.The included ocular globes and orbital contents are non-suspicious. OTHER: None. IMPRESSION: 1. Evolving acute central cerebellar hematoma (16 cc, previously 30 cc). Regional mass effect without obstructive hydrocephalus. 2. Decreased falcotentorial and RIGHT posterior fossa subdural hematomas extending into included cervical spine. Small volume subarachnoid hemorrhage. Electronically Signed   By: Elon Alas M.D.   On: 02/15/2018 01:16   Ct Angio Neck W Or Wo Contrast  Result Date: 02/15/2018 CLINICAL DATA:  72 y/o  M; follow-up of intracranial hemorrhage. EXAM: CT ANGIOGRAPHY HEAD AND NECK TECHNIQUE: Multidetector CT imaging of the head and neck was performed using the standard protocol during bolus administration of intravenous contrast. Multiplanar CT image reconstructions and MIPs were obtained to evaluate the vascular anatomy. Carotid stenosis measurements (when applicable) are obtained utilizing NASCET criteria, using the distal internal carotid diameter as the denominator. CONTRAST:  55mL ISOVUE-370 IOPAMIDOL (ISOVUE-370) INJECTION 76% COMPARISON:  03/05/2018 CT head. FINDINGS: CTA NECK FINDINGS Aortic arch: Standard branching. Imaged portion shows no evidence of aneurysm or dissection. No significant stenosis of the major arch vessel origins. Right carotid system: No evidence of dissection, stenosis (50% or greater) or occlusion. Left carotid system: No evidence of dissection, stenosis (50% or greater) or occlusion. Vertebral arteries: Codominant. No evidence of dissection, stenosis (50% or greater) or occlusion. Skeleton: Mild-to-moderate cervical spondylosis with multilevel disc and facet  degenerative changes. No high-grade bony canal stenosis. Other neck: Negative. Upper chest:  Negative. Review of the MIP images confirms the above findings CTA HEAD FINDINGS Anterior circulation: No significant stenosis, proximal occlusion, aneurysm, or vascular malformation. Posterior circulation: Mild stenosis of the left vertebral artery at the left PICA origin. The left PICA asymmetrically enlarged with a branch extending to a cluster of vessels within the posteromedial aspect of the left cerebellar hemisphere spanning approximately 11 mm (series 12, image 157-158 and series 13, image 105, also see sagittal MIPS). The right vertebral, basilar, and bilateral posterior cerebral arteries are unremarkable. Venous sinuses: Hyperdense prominent venous structure within the medial cerebellum, possibly a draining vein from the left cerebellar vascular malformation (series 12, image 22). Anatomic variants: None significant. Delayed phase: No abnormal intracranial enhancement. Review of the MIP images confirms the above findings IMPRESSION: CTA head: 1. Small group of abnormal vessels within the posteromedial aspect of left cerebellar hemisphere spanning approximately 11 mm, suspected arteriovenous malformation. 2. Mild stenosis of left vertebral artery and PICA origin. 3. No additional aneurysm, stenosis, vascular malformation, or occlusion of the anterior and posterior circulation. CTA neck: Patent carotid and vertebral arteries of the neck. No dissection, aneurysm, or hemodynamically significant stenosis by NASCET criteria. These results will be called to the ordering clinician or representative by the Radiologist Assistant, and communication documented in the PACS or zVision Dashboard. Electronically Signed   By: Kristine Garbe M.D.   On: 02/15/2018 19:17   Mr Jeri Cos OJ Contrast  Result Date: 02/17/2018 CLINICAL DATA:  Follow-up exam for intracranial hemorrhage. EXAM: MRI HEAD WITHOUT AND WITH CONTRAST TECHNIQUE: Multiplanar, multiecho pulse sequences of the brain and surrounding structures were  obtained without and with intravenous contrast. CONTRAST:  90 cc of Gadavist. COMPARISON:  Comparison made with prior CTA from 02/15/2018 as well as previous CTs from 02/13/2018. FINDINGS: Brain: Intraparenchymal hemorrhage involving the cerebellar vermis and right cerebellar hemisphere again seen, relatively stable in size measuring approximately 3.6 x 2.9 x 3.2 cm. Localized edema with regional mass effect similar as well. Partial effacement of the fourth ventricle and fourth ventricular outflow tract anteriorly which remain patent. Ventricular size is stable without hydrocephalus or ventricular trapping. Slight asymmetry of the lateral ventricles with the left larger than the right noted, likely congenital. Mild mass effect on the brainstem anteriorly without frank compression. No transtentorial herniation. Probable enhancing tangle of vessels at the adjacent posterior left cerebellum suspicious for AVM (series 18, image 16, also seen on prior CTA. No other abnormal enhancement seen underlying the hematoma. Right posterior fossa of subdural hematoma relatively stable measuring up to 4 mm in thickness. Additional small subdural collection overlying the posterior right cerebral convexity measures up to 2 mm without mass effect, likely small volume subdural hemorrhage and/or reactive hygroma. Scattered small volume subarachnoid hemorrhage present within the underlying right temporal occipital region (series 13, image 30). Smooth dural enhancement overlying the right cerebral convexity likely reactive. 9 mm acute ischemic left cerebellar infarct seen adjacent to the hematoma (series 5, image 58). No other evidence for acute ischemia. Underlying cerebral volume normal. Mild chronic microvascular ischemic changes noted within the periventricular white matter. No other areas of chronic infarction. No mass lesion or abnormal enhancement elsewhere within the brain. Vascular: Major intracranial vascular flow voids  maintained left cerebellar AVM measures approximately 13 x 18 mm (series 12, image 7). Skull and upper cervical spine: Craniocervical junction within normal limits. Upper cervical spine normal. Bone marrow signal intensity normal. No  scalp soft tissue abnormality. Sinuses/Orbits: Globes and orbital soft tissues within normal limits. Mild scattered mucosal thickening throughout the ethmoidal air cells. Paranasal sinuses are otherwise clear. Trace right mastoid effusion noted, of doubtful significance. Inner ear structures normal. Other: None. IMPRESSION: 1. No significant interval change in size and appearance of evolving intraparenchymal hemorrhage involving the right cerebellum and cerebellar vermis. Similar regional mass effect without hydrocephalus or herniation. 2. Associated small right posterior fossa and posterior right cerebral convexity subdural hematoma without significant mass effect, stable. Persistent small volume underlying subarachnoid hemorrhage. 3. 9 mm acute ischemic nonhemorrhagic left cerebellar infarct adjacent to the cerebellar hemorrhage. 4. Small left cerebellar AVM, presumably the source of hemorrhage. Electronically Signed   By: Jeannine Boga M.D.   On: 02/17/2018 03:39    PHYSICAL EXAM  Temp:  [98.2 F (36.8 C)-98.8 F (37.1 C)] 98.6 F (37 C) (12/17 1600) Pulse Rate:  [65-83] 83 (12/17 1700) Resp:  [17-31] 29 (12/17 1700) BP: (93-137)/(52-86) 130/70 (12/17 1700) SpO2:  [94 %-100 %] 99 % (12/17 1700) FiO2 (%):  [40 %] 40 % (12/17 1034) Weight:  [93.1 kg] 93.1 kg (12/17 0600)  General - Well nourished, well developed elderly African-American male,who is intubated and off sedation   Ophthalmologic - fundi not visualized due to noncooperation.  Cardiovascular - Regular rate and rhythm.  Neuro - he is still intubated but off sedation, eyes open and follow all simple commands.   PERRL, blink to visual threat bilaterally. Left gaze no difficulty, right gaze right eye  incomplete abduction, making eyes disconjugate on right gaze. Facial symmetry not able to test due to ET tube, tongue midline. BUE 4/5 and BLE 3/5, symmetrical. Sensation, coordination not cooperative but seems to have BUE ataxia on movement, gait not tested.   ASSESSMENT/PLAN Marvin Mendez is a 72 y.o. male with history of hypertension, postural hypotension on Florinef, CKD, thrombocytopenia admitted for generalized weakness and lethargy started 5:30 PM 02/15/2018. No tPA given due to [place].    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 Ellene Route and he will follow up to repair AVM once pt stable  Resultant lethargy, nystagmus, right arm ataxia, respiratory distress  CT head cerebellar ICH with SDH  Repeat CT showed decreased right cerebellum and vermis ICH, stable right posterior fossa SDH  MRI with and without contrast - evolving ICH of the right cerebellum and cerebellar vermis. Small acute left cerebellar infarct adjacent to the cerebellar hemorrhage. Small left cerebellar AVM, presumably the source of hemorrhage.  CTA head and neck concerning left cerebellar AVM - Dr. Ellene Route will take care of it once pt stable  CT repeat 02/24/18 - evolving cerebellar hematoma, stable in size compared with initial CT.  No hydrocephalus  2D Echo  Left ventricular ejection fraction 55-60%.  LDL 76  HgbA1c 5.5  Heparin subq for VTE prophylaxis  NPO on TF  No antithrombotic prior to admission, now on No antithrombotic.   Ongoing aggressive stroke risk factor management  Therapy recommendations:  Pending   Disposition:  Pending   Respiratory failure  Intubated on admission  Extubated 02/15/18 - reintubated 02/18/18  Plan to extubate today  CCM on board  Aspiration pneumonia  Sputum culture positive for Enterobacter and hemophilia  Blood culture negative  Off cefepime  CCM on  board  Afebrile   WBC 11.2-10.6->7.2  Thrombocytopenia, improving  platelet 147-> 138->76->57->72->109  Close  CBC monitoring  Hypertension . Stable  BP goal < 160  Off Cleviprex   Labetalol PRN  On Norvasc and metoprolol  DC fludrocortisone  Hyperlipidemia  Home meds:  none   LDL 76, goal < 70  No statin at this time  AKI on CKD  Creatinine 1.5->1.5->1.34-2.5-2.38-1.83-1.49->1.54-1.46  Continue free water 200 cc every 6 hours and TF @ 70  BMP monitoring  Dysphagia  On tube feeding at 70 cc  Speech to follow once extubated  Other Stroke Risk Factors  Advanced age  Other Active Problems  Hyperglycemia, improved  Postural hypotension on Florinef at home  Constipation - on miralax and [facility] day # 12  This patient is critically ill due to cerebellar ICH, posterior fossa SDH, cerebral edema, respiratory distress hypertensive emergency and at significant risk of neurological worsening, death form hematoma expansion, cerebral edema, brain herniation, seizure, respiratory failure. This patient's care requires constant monitoring of vital signs, hemodynamics, respiratory and cardiac monitoring, review of multiple databases, neurological assessment, discussion with family, other specialists and medical decision making of high complexity. I spent 30 minutes of neurocritical care time in the care of this patient    Rosalin Hawking, MD PhD Stroke Neurology 02/27/2018 5:40 PM     To contact Stroke Continuity provider, please refer to [email or web address] After hours, contact General Neurology

## 2018-02-27 NOTE — Progress Notes (Addendum)
Pt with worsening dyspnea and increased work of breathing, less responsive than prior assessments. VS have remained stable, however pt appears to be in respiratory distress. MD called, orders received for stat ABG. RT made aware. CCM at bedside to intubate.  Updated daughter, Clarene Critchley by phone at [phone]

## 2018-02-27 NOTE — Procedures (Signed)
Extubation Procedure Note  Patient Details:   Name: Marvin Mendez DOB: 21-Sep-1945 MRN: [id number]   Airway Documentation:    Vent end date: 02/27/18 Vent end time: 1137   Evaluation  O2 sats: stable throughout Complications: No apparent complications Patient did tolerate procedure well. Bilateral Breath Sounds: Rhonchi, Diminished   Yes   Positive cuff leak noted. Pt placed on [place] 4 L with humidity.  Pt has oral secretions and upper airway rhonchi.  Pt attempted IS reaching <250 mL.  RN at bedside.  Mingo Amber Maston Wight 02/27/2018, 11:43 AM

## 2018-02-27 NOTE — Progress Notes (Signed)
Patient ID: Marvin Mendez, male   DOB: May 15, 1945, 72 y.o.   MRN: [id number] Patient is much more alert and awake today May be ready for extubation If this occurs then ultimately may proceed with obtaining angiogram for further evaluation of AVM

## 2018-02-27 NOTE — Progress Notes (Signed)
Pt placed back on full vent support by MD.

## 2018-02-28 ENCOUNTER — Inpatient Hospital Stay (HOSPITAL_COMMUNITY): Payer: PPO

## 2018-02-28 DIAGNOSIS — Z930 Tracheostomy status: Secondary | ICD-10-CM

## 2018-02-28 DIAGNOSIS — J9600 Acute respiratory failure, unspecified whether with hypoxia or hypercapnia: Secondary | ICD-10-CM

## 2018-02-28 LAB — CBC
HCT: 29.7 % — ABNORMAL LOW (ref 39.0–52.0)
Hemoglobin: 9.8 g/dL — ABNORMAL LOW (ref 13.0–17.0)
MCH: 31.4 pg (ref 26.0–34.0)
MCHC: 33 g/dL (ref 30.0–36.0)
MCV: 95.2 fL (ref 80.0–100.0)
Platelets: 194 10*3/uL (ref 150–400)
RBC: 3.12 MIL/uL — ABNORMAL LOW (ref 4.22–5.81)
RDW: 13.4 % (ref 11.5–15.5)
WBC: 7.5 10*3/uL (ref 4.0–10.5)
nRBC: 0.3 % — ABNORMAL HIGH (ref 0.0–0.2)

## 2018-02-28 LAB — BASIC METABOLIC PANEL
Anion gap: 10 (ref 5–15)
BUN: 23 mg/dL (ref 8–23)
CHLORIDE: 111 mmol/L (ref 98–111)
CO2: 22 mmol/L (ref 22–32)
Calcium: 8 mg/dL — ABNORMAL LOW (ref 8.9–10.3)
Creatinine, Ser: 1.47 mg/dL — ABNORMAL HIGH (ref 0.61–1.24)
GFR calc Af Amer: 54 mL/min — ABNORMAL LOW (ref >60)
GFR calc non Af Amer: 46 mL/min — ABNORMAL LOW (ref >60)
GLUCOSE: 130 mg/dL — AB (ref 70–99)
Potassium: 3.5 mmol/L (ref 3.5–5.1)
Sodium: 143 mmol/L (ref 135–145)

## 2018-02-28 LAB — GLUCOSE, CAPILLARY
Glucose-Capillary: 109 mg/dL — ABNORMAL HIGH (ref 70–99)
Glucose-Capillary: 119 mg/dL — ABNORMAL HIGH (ref 70–99)
Glucose-Capillary: 142 mg/dL — ABNORMAL HIGH (ref 70–99)

## 2018-02-28 MED ORDER — SODIUM CHLORIDE 0.9% FLUSH: 10.0000 mL | INTRAVENOUS | Status: DC | PRN

## 2018-02-28 MED ORDER — FENTANYL CITRATE (PF) 100 MCG/2ML IJ SOLN
200.0000 ug | Freq: Once | INTRAMUSCULAR | Status: AC
  Administered 2018-02-28: 100 ug via INTRAVENOUS
  Filled 2018-02-28: qty 4

## 2018-02-28 MED ORDER — VECURONIUM BROMIDE 10 MG IV SOLR
10.0000 mg | Freq: Once | INTRAVENOUS | Status: AC
  Administered 2018-02-28: 10 mg via INTRAVENOUS
  Filled 2018-02-28: qty 10

## 2018-02-28 MED ORDER — FUROSEMIDE 10 MG/ML IJ SOLN
40.0000 mg | Freq: Once | INTRAMUSCULAR | Status: AC
  Administered 2018-02-28: 40 mg via INTRAVENOUS
  Filled 2018-02-28: qty 4

## 2018-02-28 MED ORDER — POTASSIUM CHLORIDE 20 MEQ/15ML (10%) PO SOLN
40.0000 meq | Freq: Once | ORAL | Status: AC
  Administered 2018-02-28: 40 meq
  Filled 2018-02-28: qty 30

## 2018-02-28 MED ORDER — FENTANYL CITRATE (PF) 100 MCG/2ML IJ SOLN: 25.0000 ug | INTRAMUSCULAR | Status: DC | PRN

## 2018-02-28 MED ORDER — PROPOFOL 10 MG/ML IV BOLUS
50.0000 mg | Freq: Once | INTRAVENOUS | Status: AC
  Administered 2018-02-28: 100 mg via INTRAVENOUS

## 2018-02-28 MED ORDER — MIDAZOLAM HCL 2 MG/2ML IJ SOLN
5.0000 mg | Freq: Once | INTRAMUSCULAR | Status: AC
  Administered 2018-02-28: 4 mg via INTRAVENOUS
  Filled 2018-02-28: qty 6

## 2018-02-28 MED ORDER — WHITE PETROLATUM EX OINT
TOPICAL_OINTMENT | CUTANEOUS | Status: AC
  Administered 2018-02-28: 0.2
  Filled 2018-02-28: qty 28.35

## 2018-02-28 MED ORDER — METOPROLOL TARTRATE 25 MG PO TABS
25.0000 mg | ORAL_TABLET | Freq: Two times a day (BID) | ORAL | Status: DC
  Administered 2018-02-28: 25 mg
  Filled 2018-02-28: qty 1

## 2018-02-28 MED ORDER — SODIUM CHLORIDE 0.9% FLUSH
10.0000 mL | Freq: Two times a day (BID) | INTRAVENOUS | Status: DC
  Administered 2018-02-28: 10 mL

## 2018-02-28 MED ORDER — PROPOFOL 500 MG/50ML IV EMUL
INTRAVENOUS | Status: AC
  Administered 2018-02-28: 100 mg via INTRAVENOUS
  Filled 2018-02-28: qty 50

## 2018-02-28 MED ORDER — ETOMIDATE 2 MG/ML IV SOLN
40.0000 mg | Freq: Once | INTRAVENOUS | Status: AC
  Administered 2018-02-28: 20 mg via INTRAVENOUS
  Filled 2018-02-28: qty 20

## 2018-02-28 NOTE — Progress Notes (Signed)
Patient ID: Marvin Mendez, male   DOB: 03-02-1946, 72 y.o.   MRN: [id number] Note that patient failed extubation yesterday will likely need trach for long-term management continue supportive care for now we will hold off on consideration of angiogram at this time

## 2018-02-28 NOTE — Progress Notes (Signed)
NAME:  Marvin Mendez, MRN:  [id number], DOB:  [birth year], LOS: 61 ADMISSION DATE:  02/18/2018, CONSULTATION DATE:  12/5 REFERRING MD:  Ellender Hose, CHIEF COMPLAINT:  ICH   Brief History   72 y/o male with intracerebellar ICH on 12/5, intubated for airway protection.  Extubated 12/5 but re-intubated for aspiraiton on 12/9.  Past Medical History  HTN, CKD 3, anemia (b12 deficient), thrombocytopenia, and autonomic postural hypotension (on florinef). Family reports he is a very active man. Works out several times per week. Is very strong and independent.   Significant Hospital Events   12/05 Admitted 12/09 Reintubated. Limited CODE BLUE 12/13 Changed code status back to full code 12/15 Ongoing increased secretions, HME changed 3 times overnight; constipated. Aggressive bowel regimen started.  12/16 abd softer after disimpactment by nursing staff. Passing SBT.  Extubation held off due to work of breathing following suctioning event and tracheal secretions 12/17: Passed spontaneous breathing trial once again, extubated in a.m.  Required reintubation in the evening at 6 PM placed back on Precedex Consults:  Neurosurgery - 12/5 Neurology - 12/5  Procedures:  ETT 12/4 > 12/5, 12/9 >> 12/17>>>  Significant Diagnostic Tests:  CT head 12/4 >> acute IPH in the central cerebellum with estimated volume 29cc. Assocaited edema and mass effect with probable early herniation. Subarachnoid extension. Possible intraventricular extension into fourth ventricle.   CT head 12/5 >> with decreased central cerebellar hemorrhage to 15 cc.  CT Head 12/8 >> Interval decrease in size of evolving acute central cerebellar hematoma. Persistent regional mass effect without obstructive hydrocephalus, Persistent SDH  Micro Data:  12/9  sputum >> hemophilus, enterobacter 12/9  blood cultures x 2 >> neg  Antimicrobials:  Vanco 12/9 > 12/11 Cefepime 12/9 >12/15  Interim history/subjective:  Sedated on  ventilator  Objective   Blood pressure 106/61, pulse 95, temperature 98.4 F (36.9 C), temperature source Axillary, resp. rate 17, height 6' (1.829 m), weight 98.5 kg, SpO2 98 %.    Vent Mode: PRVC FiO2 (%):  [40 %-100 %] 40 % Set Rate:  [18 bmp] 18 bmp Vt Set:  [620 mL] 620 mL PEEP:  [5 cmH20] 5 cmH20 Pressure Support:  [5 cmH20] 5 cmH20 Plateau Pressure:  [12 cmH20-17 cmH20] 14 cmH20   Intake/Output Summary (Last 24 hours) at 02/28/2018 0844 Last data filed at 02/28/2018 0800 Gross per 24 hour  Intake 2457.08 ml  Output 1400 ml  Net 1057.08 ml   Filed Weights   02/26/18 0347 02/27/18 0600 02/28/18 0500  Weight: 103.5 kg 93.1 kg 98.5 kg    Examination:  General: 72 year old male patient currently sedated on Precedex infusion continues to move all extremities, got agitated this morning requiring escalation of Precedex HEENT normocephalic atraumatic no jugular venous distention orally intubated Pulmonary: Scattered rhonchi no accessory use still has thick tracheal secretions Cardiac: Regular rate and rhythm Abdomen: Softer nontender Extremities warm and dry GU: Clear Neuro: Moves all extremities generalized weakness.  Resolved Hospital Problem list   Aspiration PNA (7 days cefepime completed 12/15) Postural Hypotension  Assessment & Plan:   ICH: cerebellar bleed with subarachnoid and possibly intraventricular extension Acute encephalopathy -off 3%NS  Plan  Cont Seroquel  Precedex and PRN fentanyl  DC fentanyl and Precedex once trach placed    Acute respiratory failure w/ on-going ventilator dependence  -failed extubation x2, seemingly due to mix of ineffective airway clearance and decreased abdominal compliance.  Reintubated 12/17 following second extubation -Portable chest x-ray personally reviewed: Persistent left basilar atelectasis with  elevated left hemidiaphragm: Colon Still well visualized  plan Continue full ventilator support PAD protocol RASS -1 VAP  bundle Reculture sputum Lasix x1 Will need trach   CKD III w/ fluid and electrolyte imbalance: hypernatremia/hyperchloremia -> Stable Na and Cl Plan Continue current free water Renal adjust medications as indicated A.m. chemistry  Hypertension  Plan  no change in Lopressor or Norvasc  Hyperglycemia without DM Plan  sliding scale insulin Thrombocytopenia from critical illness Platelets have actually improved some Plan PRN CBC  Constipation Abdomen softer Plan Continuing current bowel regimen  Best practice:  Diet: TF; held since 12/15 for constipation and trial of extubation.  Pain/Anxiety/Delirium protocol (if indicated): in place VAP protocol (if indicated): yes DVT prophylaxis: SCD GI prophylaxis: PPI Glucose control: SSI Mobility: n/a Code Status: Full code.   Family Communication: No family at bedside am 12/15.updated sister  Disposition: He remains critically ill.  We are currently titrating sedation for RASS goal -1.  He is now failed extubation x2.  I believe he will need tracheostomy.  We will send repeat sputum culture today  Critical care time: 33 min      Erick Colace ACNP-BC [place] Pager # [phone] OR # [phone] if no answer

## 2018-02-28 NOTE — Procedures (Signed)
Percutaneous Tracheostomy Placement  Consent from family.  Patient sedated, paralyzed and position.  Placed on 100% FiO2 and RR matched.  Area cleaned and draped.  Lidocaine/epi injected.  Skin incision done followed by blunt dissection.  Trachea palpated then punctured, catheter passed and visualized bronchoscopically.  Wire placed and visualized.  Catheter removed.  Airway then entered and dilated.  Size 6 cuffed shiley trach placed and visualized bronchoscopically well above carina.  Good volume returns.  Patient tolerated the procedure well without complications.  Minimal blood loss.  CXR ordered and pending.  Dr. Lynetta Mare first assist  Rush Farmer, M.D. [facility] Pulmonary/Critical Care Medicine. Pager: [phone]. After hours pager: [phone].

## 2018-02-28 NOTE — Procedures (Signed)
Bronchoscopy Procedure Note Marvin Mendez [id number] 06/01/1945  Procedure: Bronchoscopy Indications: tracheostomy placement   Procedure Details Consent: Risks of procedure as well as the alternatives and risks of each were explained to the (patient/caregiver).  Consent for procedure obtained. Time Out: Verified patient identification, verified procedure, site/side was marked, verified correct patient position, special equipment/implants available, medications/allergies/relevent history reviewed, required imaging and test results available.  Performed  In preparation for procedure, patient was given 100% FiO2. Sedation: Benzodiazepines and Muscle relaxants  Procedure done by P  ACNP-BC, under direct supervision of Dr Nelda Marseille. At first bronch was introduce through ET tube and structures of tracheal rings, carina identified for operator of tracheostomy who was Dr Nelda Marseille. Light of bronch passed through trachea and skin for indentification of tracheal rings for tracheostomy puncture. After this, under bronchoscopy guidance,  ET tube was pulled back sufficiently and very carefully. The ET tube was  pulled back enough to give room for tracheostomy operator and yet at same time to to ensure a secured airway. After this was accomplished, bronchoscope was withdrawn into the ET tube. After this,  Dr Nelda Marseille then performed tracheostomy under video visual provided by flexible video bronchoscopy. Followng introduction of tracheostomy,  the bronchoscope was removed from ET tube and introduced through tracheostomy. Correct position of tracheostomy was ensured, with enough room between carina and distal tracheostomy and no evidence of bleeding. The bronchoscope was then withdrawn. Respiratory therapist was then instructed to remove the ET tube.  Dr Nelda Marseille  then proceeded to complete the tracheostomy with stay sutures   No complications    Evaluation Hemodynamic Status: BP stable throughout; O2 sats: stable  throughout Patient's Current Condition: stable Specimens:  None Complications: No apparent complications Patient did tolerate procedure well.   Marvin Mendez 02/28/2018

## 2018-02-28 NOTE — Progress Notes (Signed)
STROKE TEAM PROGRESS NOTE   SUBJECTIVE (INTERVAL HISTORY) RN is at the bedside. He was extubated yesterday morning but was re-intubtated in the afternoon due to lethargy and respiratory failure. On precedex, sleepy drowsy but able to follow simple commands with eye closed.   OBJECTIVE Temp:  [98 F (36.7 C)-98.6 F (37 C)] 98.4 F (36.9 C) (12/18 0800) Pulse Rate:  [66-95] 70 (12/18 1000) Cardiac Rhythm: Normal sinus rhythm (12/18 0800) Resp:  [16-31] 17 (12/18 1000) BP: (99-144)/(58-88) 106/70 (12/18 1000) SpO2:  [94 %-100 %] 98 % (12/18 1000) FiO2 (%):  [40 %-100 %] 40 % (12/18 0756) Weight:  [98.5 kg] 98.5 kg (12/18 0500)  Recent Labs  Lab 02/27/18 1129 02/27/18 1553 02/27/18 1956 02/27/18 2357 02/28/18 0332  GLUCAP 105* 106* 119* 109* 119*   Recent Labs  Lab 02/21/18 1700  02/22/18 0449 02/22/18 1653 02/23/18 0500 02/24/18 0500 02/25/18 0225 02/26/18 0436 02/27/18 0435 02/28/18 0336  NA  --    < > 158*  --  151* 147* 146* 143 143 143  K  --    < > 3.5  --  3.5 4.3  --  3.8 3.9 3.5  CL  --    < > 129*  --  123* 118*  --  114* 114* 111  CO2  --    < > 21*  --  21* 23  --  20* 19* 22  GLUCOSE  --    < > 146*  --  141* 131*  --  130* 122* 130*  BUN  --    < > 51*  --  39* 32*  --  29* 27* 23  CREATININE  --    < > 2.38*  --  1.83* 1.49*  --  1.54* 1.46* 1.47*  CALCIUM  --    < > 7.8*  --  7.9* 7.8*  --  7.9* 7.9* 8.0*  MG 2.0  --  2.1 2.2 2.0 2.1  --   --   --   --   PHOS 3.1  --  2.8 2.6 2.3* 2.0*  --   --   --   --    < > = values in this interval not displayed.   No results for input(s): AST, ALT, ALKPHOS, BILITOT, PROT, ALBUMIN in the last 168 hours. Recent Labs  Lab 02/22/18 0449 02/23/18 0500 02/24/18 0500 02/26/18 0436 02/28/18 0336  WBC 11.7* 11.2* 10.6* 7.2 7.5  HGB 10.2* 9.9* 9.8* 9.5* 9.8*  HCT 33.2* 32.5* 31.1* 30.0* 29.7*  MCV 98.5 97.9 95.7 94.9 95.2  PLT 57* 62* 72* 109* 194   No results for input(s): CKTOTAL, CKMB, CKMBINDEX, TROPONINI  in the last 168 hours. No results for input(s): LABPROT, INR in the last 72 hours. No results for input(s): COLORURINE, LABSPEC, [place], GLUCOSEU, HGBUR, BILIRUBINUR, KETONESUR, PROTEINUR, UROBILINOGEN, NITRITE, LEUKOCYTESUR in the last 72 hours.  Invalid input(s): APPERANCEUR     Component Value Date/Time   CHOL 134 02/16/2018 0408   TRIG 74 02/16/2018 0408   HDL 43 02/16/2018 0408   CHOLHDL 3.1 02/16/2018 0408   VLDL 15 02/16/2018 0408   LDLCALC 76 02/16/2018 0408   Lab Results  Component Value Date   HGBA1C 5.5 03/12/2018   No results found for: LABOPIA, COCAINSCRNUR, LABBENZ, AMPHETMU, THCU, LABBARB  No results for input(s): ETH in the last 168 hours.  I have personally reviewed the radiological images below and agree with the radiology interpretations.  Ct Angio Head W Or Wo  Contrast  Result Date: 02/15/2018 CLINICAL DATA:  72 y/o  M; follow-up of intracranial hemorrhage. EXAM: CT ANGIOGRAPHY HEAD AND NECK TECHNIQUE: Multidetector CT imaging of the head and neck was performed using the standard protocol during bolus administration of intravenous contrast. Multiplanar CT image reconstructions and MIPs were obtained to evaluate the vascular anatomy. Carotid stenosis measurements (when applicable) are obtained utilizing NASCET criteria, using the distal internal carotid diameter as the denominator. CONTRAST:  81mL ISOVUE-370 IOPAMIDOL (ISOVUE-370) INJECTION 76% COMPARISON:  03/07/2018 CT head. FINDINGS: CTA NECK FINDINGS Aortic arch: Standard branching. Imaged portion shows no evidence of aneurysm or dissection. No significant stenosis of the major arch vessel origins. Right carotid system: No evidence of dissection, stenosis (50% or greater) or occlusion. Left carotid system: No evidence of dissection, stenosis (50% or greater) or occlusion. Vertebral arteries: Codominant. No evidence of dissection, stenosis (50% or greater) or occlusion. Skeleton: Mild-to-moderate cervical spondylosis  with multilevel disc and facet degenerative changes. No high-grade bony canal stenosis. Other neck: Negative. Upper chest: Negative. Review of the MIP images confirms the above findings CTA HEAD FINDINGS Anterior circulation: No significant stenosis, proximal occlusion, aneurysm, or vascular malformation. Posterior circulation: Mild stenosis of the left vertebral artery at the left PICA origin. The left PICA asymmetrically enlarged with a branch extending to a cluster of vessels within the posteromedial aspect of the left cerebellar hemisphere spanning approximately 11 mm (series 12, image [id number] and series 13, image 105, also see sagittal MIPS). The right vertebral, basilar, and bilateral posterior cerebral arteries are unremarkable. Venous sinuses: Hyperdense prominent venous structure within the medial cerebellum, possibly a draining vein from the left cerebellar vascular malformation (series 12, image 22). Anatomic variants: None significant. Delayed phase: No abnormal intracranial enhancement. Review of the MIP images confirms the above findings IMPRESSION: CTA head: 1. Small group of abnormal vessels within the posteromedial aspect of left cerebellar hemisphere spanning approximately 11 mm, suspected arteriovenous malformation. 2. Mild stenosis of left vertebral artery and PICA origin. 3. No additional aneurysm, stenosis, vascular malformation, or occlusion of the anterior and posterior circulation. CTA neck: Patent carotid and vertebral arteries of the neck. No dissection, aneurysm, or hemodynamically significant stenosis by NASCET criteria. These results will be called to the ordering clinician or representative by the Radiologist Assistant, and communication documented in the PACS or zVision Dashboard. Electronically Signed   By: Kristine Garbe M.D.   On: 02/15/2018 19:17   Ct Head Wo Contrast  Result Date: 02/24/2018 CLINICAL DATA:  Follow up intracranial hemorrhage. EXAM: CT HEAD WITHOUT  CONTRAST TECHNIQUE: Contiguous axial images were obtained from the base of the skull through the vertex without intravenous contrast. COMPARISON:  CT HEAD February 18, 2018 FINDINGS: BRAIN: Evolving central cerebellar 2.9 x 3.1 cm hematoma, appearing larger than prior CT though, overall less dense. Regional mass effect and potential fourth ventricle intra ventricular extension. Small to moderate volume predominately supratentorial subarachnoid hemorrhage. Trace dense falcotentorial subdural hematoma. Effaced suprasellar cistern. No hydrocephalus. No supratentorial parenchymal hemorrhage, midline shift or acute large vascular territory infarct. Patchy supratentorial white matter hypodensities compatible with mild chronic small vessel ischemic changes, normal for age. VASCULAR: Trace calcific atherosclerosis of the carotid siphons. SKULL: No skull fracture. No significant scalp soft tissue swelling. SINUSES/ORBITS: Mild paranasal sinus mucosal thickening. Mastoid air cells are well aerated.The included ocular globes and orbital contents are non-suspicious. OTHER: None. IMPRESSION: 1. Evolving subacute cerebellar hematoma appears larger than prior examination. Suspected intraventricular extension. Small to moderate presumably redistributed subarachnoid hemorrhage with trace  falcotentorial subdural hematoma. 2. Similar upward cerebellar herniation and mass effect on fourth ventricle without hydrocephalus. Electronically Signed   By: Elon Alas M.D.   On: 02/24/2018 05:21   Ct Head Wo Contrast  Result Date: 02/18/2018 CLINICAL DATA:  Follow-up examination for intracranial hemorrhage. EXAM: CT HEAD WITHOUT CONTRAST TECHNIQUE: Contiguous axial images were obtained from the base of the skull through the vertex without intravenous contrast. COMPARISON:  Prior CT from 02/18/2018. FINDINGS: Brain: Involving central cerebellar hemorrhage decreased in size now measuring 2.5 x 2.7 x 2.5 cm (8.4 cc), previously 2.8 x 3.3  x 3.3 cm (16 cc). Subdural extension with small right parafalcine and tentorial hematoma again noted, similar. Subdural hematoma at the right posterior fossa little interval changed measuring up to 4-5 mm. Persistent localized mass effect with effacement of the suprasellar cistern. Mass effect on the adjacent fourth ventricle which remains partially effaced, relatively similar. No hydrocephalus. Trace subarachnoid hemorrhage within the right temporal occipital region persist. No other new acute intracranial abnormality. No acute large vessel territory infarct. Vascular: No hyperdense vessel. Skull: Scalp soft tissues and calvarium demonstrate no acute finding. Sinuses/Orbits: Globes and orbital soft tissues within normal limits. Scattered mucosal thickening throughout the ethmoidal air cells. Paranasal sinuses are otherwise grossly clear. No mastoid effusion. Other: None. IMPRESSION: 1. Interval decrease in size of evolving acute central cerebellar hematoma (8 cc, previously 16 cc). Persistent regional mass effect without obstructive hydrocephalus. 2. Persistent small subdural hematoma overlying the right posterior fossa and along the falx and tentorium without mass effect, similar to previous. Persistent trace subarachnoid hemorrhage. Electronically Signed   By: Jeannine Boga M.D.   On: 02/18/2018 03:44   Ct Head Wo Contrast  Result Date: 02/15/2018 CLINICAL DATA:  Altered mental status. Follow-up intracranial hemorrhage. EXAM: CT HEAD WITHOUT CONTRAST TECHNIQUE: Contiguous axial images were obtained from the base of the skull through the vertex without intravenous contrast. COMPARISON:  CT HEAD February 14, 2018 at 1815 hours FINDINGS: BRAIN: Central cerebellar 2.8 x 3.3 x 3.3 cm (volume = 16 cc) intraparenchymal hematoma was 30 cc. Subdural extension with decreased 2-3 mm falcotentorial subdural hematoma. RIGHT posterior fossa subdural hematoma measuring to 4 mm tracking into the included cervical  spine. Effaced supra cerebellar cistern. Regional mass effect resulting in narrowed fourth ventricle without hydrocephalus. Trace subarachnoid hemorrhage. No acute large vascular territory infarct. VASCULAR: Trace calcific atherosclerosis of the carotid siphons. SKULL: No skull fracture. No significant scalp soft tissue swelling. SINUSES/ORBITS: Mild paranasal sinus mucosal thickening. Mastoid air cells are well aerated.The included ocular globes and orbital contents are non-suspicious. OTHER: None. IMPRESSION: 1. Evolving acute central cerebellar hematoma (16 cc, previously 30 cc). Regional mass effect without obstructive hydrocephalus. 2. Decreased falcotentorial and RIGHT posterior fossa subdural hematomas extending into included cervical spine. Small volume subarachnoid hemorrhage. Electronically Signed   By: Elon Alas M.D.   On: 02/15/2018 01:16   Ct Angio Neck W Or Wo Contrast  Result Date: 02/15/2018 CLINICAL DATA:  72 y/o  M; follow-up of intracranial hemorrhage. EXAM: CT ANGIOGRAPHY HEAD AND NECK TECHNIQUE: Multidetector CT imaging of the head and neck was performed using the standard protocol during bolus administration of intravenous contrast. Multiplanar CT image reconstructions and MIPs were obtained to evaluate the vascular anatomy. Carotid stenosis measurements (when applicable) are obtained utilizing NASCET criteria, using the distal internal carotid diameter as the denominator. CONTRAST:  20mL ISOVUE-370 IOPAMIDOL (ISOVUE-370) INJECTION 76% COMPARISON:  02/25/2018 CT head. FINDINGS: CTA NECK FINDINGS Aortic arch: Standard branching. Imaged  portion shows no evidence of aneurysm or dissection. No significant stenosis of the major arch vessel origins. Right carotid system: No evidence of dissection, stenosis (50% or greater) or occlusion. Left carotid system: No evidence of dissection, stenosis (50% or greater) or occlusion. Vertebral arteries: Codominant. No evidence of dissection,  stenosis (50% or greater) or occlusion. Skeleton: Mild-to-moderate cervical spondylosis with multilevel disc and facet degenerative changes. No high-grade bony canal stenosis. Other neck: Negative. Upper chest: Negative. Review of the MIP images confirms the above findings CTA HEAD FINDINGS Anterior circulation: No significant stenosis, proximal occlusion, aneurysm, or vascular malformation. Posterior circulation: Mild stenosis of the left vertebral artery at the left PICA origin. The left PICA asymmetrically enlarged with a branch extending to a cluster of vessels within the posteromedial aspect of the left cerebellar hemisphere spanning approximately 11 mm (series 12, image 157-158 and series 13, image 105, also see sagittal MIPS). The right vertebral, basilar, and bilateral posterior cerebral arteries are unremarkable. Venous sinuses: Hyperdense prominent venous structure within the medial cerebellum, possibly a draining vein from the left cerebellar vascular malformation (series 12, image 22). Anatomic variants: None significant. Delayed phase: No abnormal intracranial enhancement. Review of the MIP images confirms the above findings IMPRESSION: CTA head: 1. Small group of abnormal vessels within the posteromedial aspect of left cerebellar hemisphere spanning approximately 11 mm, suspected arteriovenous malformation. 2. Mild stenosis of left vertebral artery and PICA origin. 3. No additional aneurysm, stenosis, vascular malformation, or occlusion of the anterior and posterior circulation. CTA neck: Patent carotid and vertebral arteries of the neck. No dissection, aneurysm, or hemodynamically significant stenosis by NASCET criteria. These results will be called to the ordering clinician or representative by the Radiologist Assistant, and communication documented in the PACS or zVision Dashboard. Electronically Signed   By: Kristine Garbe M.D.   On: 02/15/2018 19:17   Mr Jeri Cos NG Contrast  Result  Date: 02/17/2018 CLINICAL DATA:  Follow-up exam for intracranial hemorrhage. EXAM: MRI HEAD WITHOUT AND WITH CONTRAST TECHNIQUE: Multiplanar, multiecho pulse sequences of the brain and surrounding structures were obtained without and with intravenous contrast. CONTRAST:  90 cc of Gadavist. COMPARISON:  Comparison made with prior CTA from 02/15/2018 as well as previous CTs from 03/10/2018. FINDINGS: Brain: Intraparenchymal hemorrhage involving the cerebellar vermis and right cerebellar hemisphere again seen, relatively stable in size measuring approximately 3.6 x 2.9 x 3.2 cm. Localized edema with regional mass effect similar as well. Partial effacement of the fourth ventricle and fourth ventricular outflow tract anteriorly which remain patent. Ventricular size is stable without hydrocephalus or ventricular trapping. Slight asymmetry of the lateral ventricles with the left larger than the right noted, likely congenital. Mild mass effect on the brainstem anteriorly without frank compression. No transtentorial herniation. Probable enhancing tangle of vessels at the adjacent posterior left cerebellum suspicious for AVM (series 18, image 16, also seen on prior CTA. No other abnormal enhancement seen underlying the hematoma. Right posterior fossa of subdural hematoma relatively stable measuring up to 4 mm in thickness. Additional small subdural collection overlying the posterior right cerebral convexity measures up to 2 mm without mass effect, likely small volume subdural hemorrhage and/or reactive hygroma. Scattered small volume subarachnoid hemorrhage present within the underlying right temporal occipital region (series 13, image 30). Smooth dural enhancement overlying the right cerebral convexity likely reactive. 9 mm acute ischemic left cerebellar infarct seen adjacent to the hematoma (series 5, image 58). No other evidence for acute ischemia. Underlying cerebral volume normal. Mild chronic microvascular  ischemic  changes noted within the periventricular white matter. No other areas of chronic infarction. No mass lesion or abnormal enhancement elsewhere within the brain. Vascular: Major intracranial vascular flow voids maintained left cerebellar AVM measures approximately 13 x 18 mm (series 12, image 7). Skull and upper cervical spine: Craniocervical junction within normal limits. Upper cervical spine normal. Bone marrow signal intensity normal. No scalp soft tissue abnormality. Sinuses/Orbits: Globes and orbital soft tissues within normal limits. Mild scattered mucosal thickening throughout the ethmoidal air cells. Paranasal sinuses are otherwise clear. Trace right mastoid effusion noted, of doubtful significance. Inner ear structures normal. Other: None. IMPRESSION: 1. No significant interval change in size and appearance of evolving intraparenchymal hemorrhage involving the right cerebellum and cerebellar vermis. Similar regional mass effect without hydrocephalus or herniation. 2. Associated small right posterior fossa and posterior right cerebral convexity subdural hematoma without significant mass effect, stable. Persistent small volume underlying subarachnoid hemorrhage. 3. 9 mm acute ischemic nonhemorrhagic left cerebellar infarct adjacent to the cerebellar hemorrhage. 4. Small left cerebellar AVM, presumably the source of hemorrhage. Electronically Signed   By: Jeannine Boga M.D.   On: 02/17/2018 03:39    PHYSICAL EXAM  Temp:  [98 F (36.7 C)-98.6 F (37 C)] 98.4 F (36.9 C) (12/18 0800) Pulse Rate:  [66-95] 70 (12/18 1000) Resp:  [16-31] 17 (12/18 1000) BP: (99-144)/(58-88) 106/70 (12/18 1000) SpO2:  [94 %-100 %] 98 % (12/18 1000) FiO2 (%):  [40 %-100 %] 40 % (12/18 0756) Weight:  [98.5 kg] 98.5 kg (12/18 0500)  General - Well nourished, well developed elderly African-American male,who is intubated and on precedex  Ophthalmologic - fundi not visualized due to noncooperation.  Cardiovascular  - Regular rate and rhythm.  Neuro - he was re-intubated on precedex, eyes closed, very lethargic but able to follow simple commands with eye closed.   PERRL, not blink to visual threat bilaterally. No tracking. Eyes mid position. Facial symmetry not able to test due to ET tube, tongue midline. BUE 2+/5 and BLE 2/5, symmetrical. DTR 1+ and no babinski. Sensation, coordination and gait not tested.   ASSESSMENT/PLAN Mr. DJUAN TALTON is a 72 y.o. male with history of hypertension, postural hypotension on Florinef, CKD, thrombocytopenia admitted for generalized weakness and lethargy started 5:30 PM 03/06/2018. No tPA given due to [place].    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 Ellene Route and he will follow up to repair AVM once pt stable  Resultant lethargy, nystagmus, right arm ataxia, respiratory distress  CT head cerebellar ICH with SDH  Repeat CT showed decreased right cerebellum and vermis ICH, stable right posterior fossa SDH  MRI with and without contrast - evolving ICH of the right cerebellum and cerebellar vermis. Small acute left cerebellar infarct adjacent to the cerebellar hemorrhage. Small left cerebellar AVM, presumably the source of hemorrhage.  CTA head and neck concerning left cerebellar AVM - Dr. Ellene Route will take care of it once pt stable  CT repeat 02/24/18 - evolving cerebellar hematoma, stable in size compared with initial CT.  No hydrocephalus  2D Echo  Left ventricular ejection fraction 55-60%.  LDL 76  HgbA1c 5.5  Heparin subq for VTE prophylaxis  NPO on TF  No antithrombotic prior to admission, now on No antithrombotic.   Ongoing aggressive stroke risk factor management  Therapy recommendations:  Pending   Disposition:  Pending   Respiratory failure  Intubated  on admission  Extubated 02/15/18 - reintubated 02/18/18-extubated and re-intubated 02/27/18  Failed  extubation yesterday  On precedex  Overnight agitation on mittens  CCM on board  Aspiration pneumonia  Sputum culture positive for Enterobacter and hemophilia  Blood culture negative  Off cefepime  CCM on board  Afebrile   WBC 11.2-10.6->7.2->7.5  Thrombocytopenia, resolved  platelet 147-> 138->76->57->72->109->194  Close CBC monitoring  Hypertension . Stable on the low side  BP goal < 160  Off Cleviprex   Labetalol PRN  On Norvasc and metoprolol, decreased metoprolol to 25mg  bid  DC fludrocortisone  Hyperlipidemia  Home meds:  none   LDL 76, goal < 70  No statin at this time  AKI on CKD  Creatinine 1.5->1.5->1.34-2.5-2.38-1.83-1.49->1.54-1.46-1.47  Continue free water 200 cc every 6 hours and TF @ 70  BMP monitoring  Dysphagia  On tube feeding at 70 cc  Speech to follow once extubated  Other Stroke Risk Factors  Advanced age  Other Active Problems  Hyperglycemia, improved  Postural hypotension on Florinef at home  Constipation - on miralax and golite, resolved  Hospital day # 13  This patient is critically ill due to cerebellar ICH, posterior fossa SDH, cerebral edema, respiratory distress hypertensive emergency and at significant risk of neurological worsening, death form hematoma expansion, cerebral edema, brain herniation, seizure, respiratory failure. This patient's care requires constant monitoring of vital signs, hemodynamics, respiratory and cardiac monitoring, review of multiple databases, neurological assessment, discussion with family, other specialists and medical decision making of high complexity. I spent 35 minutes of neurocritical care time in the care of this patient    Rosalin Hawking, MD PhD Stroke Neurology 02/28/2018 10:14 AM     To contact Stroke Continuity provider, please refer to [email or web address] After hours, contact General Neurology

## 2018-03-01 LAB — GLUCOSE, CAPILLARY
Glucose-Capillary: 103 mg/dL — ABNORMAL HIGH (ref 70–99)
Glucose-Capillary: 104 mg/dL — ABNORMAL HIGH (ref 70–99)
Glucose-Capillary: 110 mg/dL — ABNORMAL HIGH (ref 70–99)
Glucose-Capillary: 110 mg/dL — ABNORMAL HIGH (ref 70–99)
Glucose-Capillary: 112 mg/dL — ABNORMAL HIGH (ref 70–99)
Glucose-Capillary: 113 mg/dL — ABNORMAL HIGH (ref 70–99)
Glucose-Capillary: 116 mg/dL — ABNORMAL HIGH (ref 70–99)
Glucose-Capillary: 120 mg/dL — ABNORMAL HIGH (ref 70–99)
Glucose-Capillary: 123 mg/dL — ABNORMAL HIGH (ref 70–99)
Glucose-Capillary: 125 mg/dL — ABNORMAL HIGH (ref 70–99)

## 2018-03-01 MED FILL — Medication: Qty: 1 | Status: AC

## 2018-03-02 LAB — CULTURE, RESPIRATORY W GRAM STAIN

## 2018-03-02 LAB — CULTURE, RESPIRATORY: SPECIAL REQUESTS: NORMAL

## 2018-03-14 NOTE — Progress Notes (Signed)
Paged by Margaree Mackintosh regarding this patient. Reportedly became PEA cardiac arrest. Went to evaluate the patient in the ICU. CODE BLUE running for upwards of 25 minutes. Patient with no brainstem reflexes.  Pupils 6 mm fixed dilated, no corneal reflexes.  No spontaneous movement.  No movement to nox stimulation. PCCM attending on E link running CODE BLUE terminated the code after upwards of 30 minutes with consensus as there was no return of spontaneous circulation.  I am in agreement that further resuscitation efforts would have been futile. Family notified and daughter en route.  -- Amie [place], MD [facility] Pager: [phone] If 7pm to 7am, please call on call as listed on AMION.

## 2018-03-14 NOTE — Progress Notes (Signed)
eLink Physician-Brief Progress Note Patient Name: Marvin Mendez DOB: Dec 15, 1945 MRN: [id number]   Date of Service  March 14, 2018  HPI/Events of Note  Patient went into asystole after being turned.  CPR ensued. Accucheck 120s. No difficulty in ambubagging bilateral breath sounds. Pupils 5 mm equal non-reactive. Patient persistently in PEA .  eICU Interventions  Daughter on her way. [facility] followed by Dr. Rory Percy in the room during the code. No reversible cause identified. Code called after 7 cycles of epinephrine, MDs concurred.     Intervention Category Major Interventions: Code management / supervision  Judd Lien 2018/03/14, 3:24 AM

## 2018-03-14 NOTE — Progress Notes (Signed)
Chaplain answered a code blue on this Pt. Chaplain continues to be nearby. Family called by RN.

## 2018-03-14 NOTE — Accreditation Note (Signed)
Restraints not reported to CMS Pursuant to regulation 482.13 (G) (3) use of soft wrist restraints. 

## 2018-03-14 NOTE — Death Summary Note (Signed)
DEATH SUMMARY   Patient Details  Name: Marvin Mendez MRN: [id number] DOB: [birth year]  Admission/Discharge Information   Admit Date:  03/13/2018  Date of Death: Date of Death: 28-Mar-2018  Time of Death: Time of Death: 0335  Length of Stay: 2022-06-22  Referring Physician: Patient, No Pcp Per   Reason(s) for Hospitalization  Cerebellar hemorrhage with herniation  Diagnoses  Preliminary cause of death: Cardiac arrest due to pulseless electrical activity Secondary Diagnoses (including complications and co-morbidities):  Active Problems:   ICH (intracerebral hemorrhage) (HCC)   Acute respiratory insufficiency   AVM (arteriovenous malformation) brain   Cytotoxic brain edema (HCC)   Acute respiratory failure (HCC)   Tracheostomy status [facility])   Brief Hospital Course (including significant findings, care, treatment, and services provided and events leading to death)  Marvin Mendez is a 73 y.o. year old male who presented with a cerebellar hemorrhage he made a good neurological recovery but remained intubated due to respiratory failure and failed several attempts at extubation due to inability to manage secretions.  Otherwise alert and oriented and able to ambulate.  Had several episodes of mucous plugging.  Eventually underwent tracheostomy placement which was uneventful.  He then unfortunately suffered an unheralded PEA cardiac arrest.  Consent for an autopsy was unfortunately not obtained.  Pertinent Labs and Studies  Significant Diagnostic Studies Ct Angio Head W Or Wo Contrast  Result Date: 02/15/2018 CLINICAL DATA:  73 y/o  M; follow-up of intracranial hemorrhage. EXAM: CT ANGIOGRAPHY HEAD AND NECK TECHNIQUE: Multidetector CT imaging of the head and neck was performed using the standard protocol during bolus administration of intravenous contrast. Multiplanar CT image reconstructions and MIPs were obtained to evaluate the vascular anatomy. Carotid stenosis measurements (when applicable) are  obtained utilizing NASCET criteria, using the distal internal carotid diameter as the denominator. CONTRAST:  84mL ISOVUE-370 IOPAMIDOL (ISOVUE-370) INJECTION 76% COMPARISON:  2018/03/13 CT head. FINDINGS: CTA NECK FINDINGS Aortic arch: Standard branching. Imaged portion shows no evidence of aneurysm or dissection. No significant stenosis of the major arch vessel origins. Right carotid system: No evidence of dissection, stenosis (50% or greater) or occlusion. Left carotid system: No evidence of dissection, stenosis (50% or greater) or occlusion. Vertebral arteries: Codominant. No evidence of dissection, stenosis (50% or greater) or occlusion. Skeleton: Mild-to-moderate cervical spondylosis with multilevel disc and facet degenerative changes. No high-grade bony canal stenosis. Other neck: Negative. Upper chest: Negative. Review of the MIP images confirms the above findings CTA HEAD FINDINGS Anterior circulation: No significant stenosis, proximal occlusion, aneurysm, or vascular malformation. Posterior circulation: Mild stenosis of the left vertebral artery at the left PICA origin. The left PICA asymmetrically enlarged with a branch extending to a cluster of vessels within the posteromedial aspect of the left cerebellar hemisphere spanning approximately 11 mm (series 12, image [id number] and series 13, image 105, also see sagittal MIPS). The right vertebral, basilar, and bilateral posterior cerebral arteries are unremarkable. Venous sinuses: Hyperdense prominent venous structure within the medial cerebellum, possibly a draining vein from the left cerebellar vascular malformation (series 12, image 22). Anatomic variants: None significant. Delayed phase: No abnormal intracranial enhancement. Review of the MIP images confirms the above findings IMPRESSION: CTA head: 1. Small group of abnormal vessels within the posteromedial aspect of left cerebellar hemisphere spanning approximately 11 mm, suspected arteriovenous  malformation. 2. Mild stenosis of left vertebral artery and PICA origin. 3. No additional aneurysm, stenosis, vascular malformation, or occlusion of the anterior and posterior circulation. CTA neck: Patent carotid and vertebral  arteries of the neck. No dissection, aneurysm, or hemodynamically significant stenosis by NASCET criteria. These results will be called to the ordering clinician or representative by the Radiologist Assistant, and communication documented in the PACS or zVision Dashboard. Electronically Signed   By: Kristine Garbe M.D.   On: 02/15/2018 19:17   Dg Abd 1 View  Result Date: 02/26/2018 CLINICAL DATA:  Advancement of a nasoenteric feeding tube under fluoroscopic guidance. EXAM: ABDOMEN - 1 VIEW COMPARISON:  Earlier today. FINDINGS: The nasoenteric feeding tube has been advanced into the proximal small bowel. Injected contrast demonstrates the tip in the proximal jejunum. Retained contrast is again demonstrated in the colon. The included bowel gas pattern is normal and the bones are unremarkable. IMPRESSION: Feeding tube tip in the proximal jejunum. Electronically Signed   By: Claudie Revering M.D.   On: 02/26/2018 15:08   Ct Head Wo Contrast  Result Date: 02/24/2018 CLINICAL DATA:  Follow up intracranial hemorrhage. EXAM: CT HEAD WITHOUT CONTRAST TECHNIQUE: Contiguous axial images were obtained from the base of the skull through the vertex without intravenous contrast. COMPARISON:  CT HEAD February 18, 2018 FINDINGS: BRAIN: Evolving central cerebellar 2.9 x 3.1 cm hematoma, appearing larger than prior CT though, overall less dense. Regional mass effect and potential fourth ventricle intra ventricular extension. Small to moderate volume predominately supratentorial subarachnoid hemorrhage. Trace dense falcotentorial subdural hematoma. Effaced suprasellar cistern. No hydrocephalus. No supratentorial parenchymal hemorrhage, midline shift or acute large vascular territory infarct.  Patchy supratentorial white matter hypodensities compatible with mild chronic small vessel ischemic changes, normal for age. VASCULAR: Trace calcific atherosclerosis of the carotid siphons. SKULL: No skull fracture. No significant scalp soft tissue swelling. SINUSES/ORBITS: Mild paranasal sinus mucosal thickening. Mastoid air cells are well aerated.The included ocular globes and orbital contents are non-suspicious. OTHER: None. IMPRESSION: 1. Evolving subacute cerebellar hematoma appears larger than prior examination. Suspected intraventricular extension. Small to moderate presumably redistributed subarachnoid hemorrhage with trace falcotentorial subdural hematoma. 2. Similar upward cerebellar herniation and mass effect on fourth ventricle without hydrocephalus. Electronically Signed   By: Elon Alas M.D.   On: 02/24/2018 05:21   Ct Head Wo Contrast  Result Date: 02/18/2018 CLINICAL DATA:  Follow-up examination for intracranial hemorrhage. EXAM: CT HEAD WITHOUT CONTRAST TECHNIQUE: Contiguous axial images were obtained from the base of the skull through the vertex without intravenous contrast. COMPARISON:  Prior CT from 02/19/2018. FINDINGS: Brain: Involving central cerebellar hemorrhage decreased in size now measuring 2.5 x 2.7 x 2.5 cm (8.4 cc), previously 2.8 x 3.3 x 3.3 cm (16 cc). Subdural extension with small right parafalcine and tentorial hematoma again noted, similar. Subdural hematoma at the right posterior fossa little interval changed measuring up to 4-5 mm. Persistent localized mass effect with effacement of the suprasellar cistern. Mass effect on the adjacent fourth ventricle which remains partially effaced, relatively similar. No hydrocephalus. Trace subarachnoid hemorrhage within the right temporal occipital region persist. No other new acute intracranial abnormality. No acute large vessel territory infarct. Vascular: No hyperdense vessel. Skull: Scalp soft tissues and calvarium demonstrate  no acute finding. Sinuses/Orbits: Globes and orbital soft tissues within normal limits. Scattered mucosal thickening throughout the ethmoidal air cells. Paranasal sinuses are otherwise grossly clear. No mastoid effusion. Other: None. IMPRESSION: 1. Interval decrease in size of evolving acute central cerebellar hematoma (8 cc, previously 16 cc). Persistent regional mass effect without obstructive hydrocephalus. 2. Persistent small subdural hematoma overlying the right posterior fossa and along the falx and tentorium without mass effect, similar to  previous. Persistent trace subarachnoid hemorrhage. Electronically Signed   By: Jeannine Boga M.D.   On: 02/18/2018 03:44   Ct Head Wo Contrast  Result Date: 02/15/2018 CLINICAL DATA:  Altered mental status. Follow-up intracranial hemorrhage. EXAM: CT HEAD WITHOUT CONTRAST TECHNIQUE: Contiguous axial images were obtained from the base of the skull through the vertex without intravenous contrast. COMPARISON:  CT HEAD February 14, 2018 at 1815 hours FINDINGS: BRAIN: Central cerebellar 2.8 x 3.3 x 3.3 cm (volume = 16 cc) intraparenchymal hematoma was 30 cc. Subdural extension with decreased 2-3 mm falcotentorial subdural hematoma. RIGHT posterior fossa subdural hematoma measuring to 4 mm tracking into the included cervical spine. Effaced supra cerebellar cistern. Regional mass effect resulting in narrowed fourth ventricle without hydrocephalus. Trace subarachnoid hemorrhage. No acute large vascular territory infarct. VASCULAR: Trace calcific atherosclerosis of the carotid siphons. SKULL: No skull fracture. No significant scalp soft tissue swelling. SINUSES/ORBITS: Mild paranasal sinus mucosal thickening. Mastoid air cells are well aerated.The included ocular globes and orbital contents are non-suspicious. OTHER: None. IMPRESSION: 1. Evolving acute central cerebellar hematoma (16 cc, previously 30 cc). Regional mass effect without obstructive hydrocephalus. 2.  Decreased falcotentorial and RIGHT posterior fossa subdural hematomas extending into included cervical spine. Small volume subarachnoid hemorrhage. Electronically Signed   By: Elon Alas M.D.   On: 02/15/2018 01:16   Ct Angio Neck W Or Wo Contrast  Result Date: 02/15/2018 CLINICAL DATA:  73 y/o  M; follow-up of intracranial hemorrhage. EXAM: CT ANGIOGRAPHY HEAD AND NECK TECHNIQUE: Multidetector CT imaging of the head and neck was performed using the standard protocol during bolus administration of intravenous contrast. Multiplanar CT image reconstructions and MIPs were obtained to evaluate the vascular anatomy. Carotid stenosis measurements (when applicable) are obtained utilizing NASCET criteria, using the distal internal carotid diameter as the denominator. CONTRAST:  77mL ISOVUE-370 IOPAMIDOL (ISOVUE-370) INJECTION 76% COMPARISON:  02/28/2018 CT head. FINDINGS: CTA NECK FINDINGS Aortic arch: Standard branching. Imaged portion shows no evidence of aneurysm or dissection. No significant stenosis of the major arch vessel origins. Right carotid system: No evidence of dissection, stenosis (50% or greater) or occlusion. Left carotid system: No evidence of dissection, stenosis (50% or greater) or occlusion. Vertebral arteries: Codominant. No evidence of dissection, stenosis (50% or greater) or occlusion. Skeleton: Mild-to-moderate cervical spondylosis with multilevel disc and facet degenerative changes. No high-grade bony canal stenosis. Other neck: Negative. Upper chest: Negative. Review of the MIP images confirms the above findings CTA HEAD FINDINGS Anterior circulation: No significant stenosis, proximal occlusion, aneurysm, or vascular malformation. Posterior circulation: Mild stenosis of the left vertebral artery at the left PICA origin. The left PICA asymmetrically enlarged with a branch extending to a cluster of vessels within the posteromedial aspect of the left cerebellar hemisphere spanning  approximately 11 mm (series 12, image [id number] and series 13, image 105, also see sagittal MIPS). The right vertebral, basilar, and bilateral posterior cerebral arteries are unremarkable. Venous sinuses: Hyperdense prominent venous structure within the medial cerebellum, possibly a draining vein from the left cerebellar vascular malformation (series 12, image 22). Anatomic variants: None significant. Delayed phase: No abnormal intracranial enhancement. Review of the MIP images confirms the above findings IMPRESSION: CTA head: 1. Small group of abnormal vessels within the posteromedial aspect of left cerebellar hemisphere spanning approximately 11 mm, suspected arteriovenous malformation. 2. Mild stenosis of left vertebral artery and PICA origin. 3. No additional aneurysm, stenosis, vascular malformation, or occlusion of the anterior and posterior circulation. CTA neck: Patent carotid and vertebral arteries of  the neck. No dissection, aneurysm, or hemodynamically significant stenosis by NASCET criteria. These results will be called to the ordering clinician or representative by the Radiologist Assistant, and communication documented in the PACS or zVision Dashboard. Electronically Signed   By: Kristine Garbe M.D.   On: 02/15/2018 19:17   Mr Marvin Mendez LT Contrast  Result Date: 02/17/2018 CLINICAL DATA:  Follow-up exam for intracranial hemorrhage. EXAM: MRI HEAD WITHOUT AND WITH CONTRAST TECHNIQUE: Multiplanar, multiecho pulse sequences of the brain and surrounding structures were obtained without and with intravenous contrast. CONTRAST:  90 cc of Gadavist. COMPARISON:  Comparison made with prior CTA from 02/15/2018 as well as previous CTs from 02/24/2018. FINDINGS: Brain: Intraparenchymal hemorrhage involving the cerebellar vermis and right cerebellar hemisphere again seen, relatively stable in size measuring approximately 3.6 x 2.9 x 3.2 cm. Localized edema with regional mass effect similar as well.  Partial effacement of the fourth ventricle and fourth ventricular outflow tract anteriorly which remain patent. Ventricular size is stable without hydrocephalus or ventricular trapping. Slight asymmetry of the lateral ventricles with the left larger than the right noted, likely congenital. Mild mass effect on the brainstem anteriorly without frank compression. No transtentorial herniation. Probable enhancing tangle of vessels at the adjacent posterior left cerebellum suspicious for AVM (series 18, image 16, also seen on prior CTA. No other abnormal enhancement seen underlying the hematoma. Right posterior fossa of subdural hematoma relatively stable measuring up to 4 mm in thickness. Additional small subdural collection overlying the posterior right cerebral convexity measures up to 2 mm without mass effect, likely small volume subdural hemorrhage and/or reactive hygroma. Scattered small volume subarachnoid hemorrhage present within the underlying right temporal occipital region (series 13, image 30). Smooth dural enhancement overlying the right cerebral convexity likely reactive. 9 mm acute ischemic left cerebellar infarct seen adjacent to the hematoma (series 5, image 58). No other evidence for acute ischemia. Underlying cerebral volume normal. Mild chronic microvascular ischemic changes noted within the periventricular white matter. No other areas of chronic infarction. No mass lesion or abnormal enhancement elsewhere within the brain. Vascular: Major intracranial vascular flow voids maintained left cerebellar AVM measures approximately 13 x 18 mm (series 12, image 7). Skull and upper cervical spine: Craniocervical junction within normal limits. Upper cervical spine normal. Bone marrow signal intensity normal. No scalp soft tissue abnormality. Sinuses/Orbits: Globes and orbital soft tissues within normal limits. Mild scattered mucosal thickening throughout the ethmoidal air cells. Paranasal sinuses are otherwise  clear. Trace right mastoid effusion noted, of doubtful significance. Inner ear structures normal. Other: None. IMPRESSION: 1. No significant interval change in size and appearance of evolving intraparenchymal hemorrhage involving the right cerebellum and cerebellar vermis. Similar regional mass effect without hydrocephalus or herniation. 2. Associated small right posterior fossa and posterior right cerebral convexity subdural hematoma without significant mass effect, stable. Persistent small volume underlying subarachnoid hemorrhage. 3. 9 mm acute ischemic nonhemorrhagic left cerebellar infarct adjacent to the cerebellar hemorrhage. 4. Small left cerebellar AVM, presumably the source of hemorrhage. Electronically Signed   By: Jeannine Boga M.D.   On: 02/17/2018 03:39   Dg Chest Port 1 View  Result Date: 02/28/2018 CLINICAL DATA:  Status post tracheostomy placement EXAM: PORTABLE CHEST 1 VIEW COMPARISON:  12/18/9 FINDINGS: Endotracheal tube has been removed and a tracheostomy tube placed in satisfactory position. Feeding catheter is noted within the proximal jejunum. The lungs are hypoinflated with mild left basilar atelectasis stable from the prior exam. No new focal abnormality is seen. IMPRESSION: Tracheostomy  in satisfactory position. Otherwise unchanged appearance of the chest. Electronically Signed   By: Inez Catalina M.D.   On: 02/28/2018 15:56   Dg Chest Port 1 View  Result Date: 02/28/2018 CLINICAL DATA:  Hypoxia EXAM: PORTABLE CHEST 1 VIEW COMPARISON:  February 27, 2018 FINDINGS: Endotracheal tube tip is 4.2 cm above the carina. Feeding tube tip is in the proximal jejunum. No pneumothorax. There is elevation of the left hemidiaphragm, stable. There is left base atelectasis. Lungs elsewhere clear. Heart is upper normal in size with pulmonary vascularity normal. No adenopathy. No bone lesions. IMPRESSION: Tube positions as described without pneumothorax. Elevation left hemidiaphragm with left  base atelectasis. Lungs elsewhere clear. Stable cardiac silhouette. Electronically Signed   By: Lowella Grip III M.D.   On: 02/28/2018 07:45   Portable Chest X-ray  Result Date: 02/27/2018 CLINICAL DATA:  Intubated EXAM: PORTABLE CHEST 1 VIEW COMPARISON:  02/26/2018 chest radiograph. FINDINGS: Endotracheal tube tip is 3.9 cm above the carina. Enteric tube enters stomach with the tip noted in the left upper quadrant, within the proximal jejunum as seen on abdominal radiograph from 1 day prior. Stable cardiomediastinal silhouette with normal heart size. No pneumothorax. No pleural effusion. Stable mild elevation of the left hemidiaphragm with the left basilar scarring versus atelectasis. No pulmonary edema. No acute consolidative airspace disease. IMPRESSION: 1. Well-positioned endotracheal and enteric tubes. 2. Stable mild elevation of the left hemidiaphragm with left basilar scarring versus atelectasis. No interval cardiopulmonary disease. Electronically Signed   By: Ilona Sorrel M.D.   On: 02/27/2018 19:02   Dg Chest Port 1 View  Result Date: 02/26/2018 CLINICAL DATA:  Hypoxia EXAM: PORTABLE CHEST 1 VIEW COMPARISON:  February 24, 2018 FINDINGS: Endotracheal tube tip is 3.6 cm above the carina. Nasogastric tube tip and side port are in the stomach. There is persistent elevation of the left hemidiaphragm with left base atelectasis. The lungs elsewhere are clear. Heart is mildly enlarged with pulmonary vascularity normal, stable. No bone lesions. IMPRESSION: Tube positions as described without pneumothorax. Persistent elevation left hemidiaphragm with left base atelectasis. No consolidation. Stable cardiac silhouette. Electronically Signed   By: Lowella Grip III M.D.   On: 02/26/2018 07:22   Dg Chest Port 1 View  Result Date: 02/24/2018 CLINICAL DATA:  Acute respiratory failure EXAM: PORTABLE CHEST 1 VIEW COMPARISON:  February 23, 2018 FINDINGS: The ETT is in good position. The left central  line is stable terminating in the SVC. No pneumothorax. Opacity in the left base persists. The cardiomediastinal silhouette is stable. Mild opacity in the right perihilar region is more prominent the interval. No other interval changes. IMPRESSION: 1. Support apparatus as above. 2. Mild right perihilar opacity is more prominent the interval. 3. Stable opacity in the left base. Electronically Signed   By: Dorise Bullion III M.D   On: 02/24/2018 10:23   Dg Chest Port 1 View  Result Date: 02/23/2018 CLINICAL DATA:  Acute respiratory failure EXAM: PORTABLE CHEST 1 VIEW COMPARISON:  02/21/2018 FINDINGS: Endotracheal tube, NG tube, left jugular central venous catheter are stable. Normal heart size. Elevation of the left hemidiaphragm is unchanged with associated left basilar atelectasis versus airspace disease. Subsegmental atelectasis at the right base is unchanged. No pneumothorax. IMPRESSION: Stable atelectasis versus airspace disease at the left lung base. Electronically Signed   By: Marybelle Killings M.D.   On: 02/23/2018 08:29   Dg Chest Port 1 View  Result Date: 02/21/2018 CLINICAL DATA:  ET tube EXAM: PORTABLE CHEST 1 VIEW COMPARISON:  02/20/2018 FINDINGS: Support devices are stable. Low lung volumes. Mild cardiomegaly and vascular congestion. Left lower lobe atelectasis or infiltrate. No effusions or acute bony abnormality. IMPRESSION: Low lung volumes. Left lower lobe atelectasis or infiltrate. Cardiomegaly, vascular congestion. Electronically Signed   By: Rolm Baptise M.D.   On: 02/21/2018 09:30   Dg Chest Port 1 View  Result Date: 02/20/2018 CLINICAL DATA:  Acute respiratory failure, intubation EXAM: PORTABLE CHEST 1 VIEW COMPARISON:  Portable exam 0555 hours compared to 02/19/2018 FINDINGS: Tip of endotracheal tube projects 4.0 cm above carina. Nasogastric tube coiled in proximal stomach. LEFT jugular central venous catheter with tip projecting over SVC. Normal heart size and mediastinal contours.  Elevation of LEFT diaphragm with bibasilar atelectasis. Peribronchial thickening. No acute infiltrate, pleural effusion or pneumothorax. IMPRESSION: Bibasilar atelectasis. Electronically Signed   By: Lavonia Dana M.D.   On: 02/20/2018 09:03   Dg Chest Port 1 View  Result Date: 02/19/2018 CLINICAL DATA:  Intubation. Acute respiratory insufficiency. EXAM: PORTABLE CHEST 1 VIEW 12:58 p.m. COMPARISON:  Chest x-rays dated 02/19/2018 at 5:05 a.m. and 02/17/2018 FINDINGS: Endotracheal tube tip is at the level of the thoracic inlet 6.8 cm above the carina. Central venous catheter tip is in the superior vena cava at the level of the azygos vein. NG tube tip is in the fundus of the stomach. There is chronic elevation of the left hemidiaphragm. Heart size and vascularity are normal. No infiltrates or effusions. No acute bone abnormality. IMPRESSION: Endotracheal tube and central line appear in good position. No acute cardiopulmonary findings. Electronically Signed   By: Lorriane Shire M.D.   On: 02/19/2018 13:15   Dg Chest Portable 1 View  Result Date: 02/19/2018 CLINICAL DATA:  Aspiration, shortness of breath EXAM: PORTABLE CHEST 1 VIEW COMPARISON:  02/17/2018 FINDINGS: Left central line remains in place, unchanged. Mild cardiomegaly. Elevation of the left hemidiaphragm with left base atelectasis or infiltrate. No confluent opacity on the right. Mild vascular congestion. No acute bony abnormality. IMPRESSION: Left base atelectasis or infiltrate, similar to prior study. Cardiomegaly, vascular congestion. Electronically Signed   By: Rolm Baptise M.D.   On: 02/19/2018 07:37   Dg Chest Port 1 View  Result Date: 02/17/2018 CLINICAL DATA:  Respiratory failure EXAM: PORTABLE CHEST 1 VIEW COMPARISON:  1 day prior FINDINGS: Left internal jugular line tip at mid SVC. Cardiomegaly accentuated by AP portable technique. Possible small left pleural effusion. No pneumothorax. Low lung volumes with resultant pulmonary interstitial  prominence. Left lower lobe airspace disease is similar. Right perihilar airspace disease is slightly increased. IMPRESSION: Bilateral airspace disease and progressive, similar on the left on the right. Infection and/or alveolar pulmonary edema. Electronically Signed   By: Abigail Miyamoto M.D.   On: 02/17/2018 08:56   Dg Chest Port 1 View  Result Date: 02/16/2018 CLINICAL DATA:  Respiratory failure EXAM: PORTABLE CHEST 1 VIEW COMPARISON:  02/15/2018 FINDINGS: Left jugular central line is again identified and stable. Cardiac shadow is stable. Increasing left basilar infiltrate with likely small effusion is noted. No pneumothorax is seen. Mild right basilar atelectasis is seen. No bony abnormality is noted. IMPRESSION: Increasing left basilar infiltrate with small effusion. Increasing right basilar atelectasis. Electronically Signed   By: Inez Catalina M.D.   On: 02/16/2018 07:34   Dg Chest Port 1 View  Result Date: 02/15/2018 CLINICAL DATA:  Central line placement. EXAM: PORTABLE CHEST 1 VIEW COMPARISON:  Tool 07/2017 FINDINGS: Left internal jugular approach central venous catheter terminates in the expected location of  the proximal superior vena cava. The patient has been extubated. Enlarged cardiac silhouette.  Left lower lobe airspace consolidation No evidence of pneumothorax. Elevation of the left hemidiaphragm with airspace consolidation versus atelectasis in the left lower lobe. Left pleural effusion is not excluded. Osseous structures are without acute abnormality. Soft tissues are grossly normal. IMPRESSION: Status post placement of left internal jugular approach central venous catheter with tip in the expected location of the proximal superior vena cava. No evidence of pneumothorax. Electronically Signed   By: Fidela Salisbury M.D.   On: 02/15/2018 17:06   Dg Chest Port 1 View  Result Date: 02/15/2018 CLINICAL DATA:  73 year old male status post intubation. EXAM: PORTABLE CHEST 1 VIEW COMPARISON:   Earlier radiograph dated 02/28/2018 FINDINGS: Endotracheal tube above the carina in similar position. Interval placement of an enteric tube which appears to extend below the diaphragm with tip in the left upper quadrant likely in the gastric fundus. There is shallow inspiration with bibasilar atelectasis. Mild eventration of the left hemidiaphragm. No focal consolidation, pleural effusion, or pneumothorax. Stable cardiac silhouette. No acute osseous pathology. IMPRESSION: Interval placement of an enteric tube with tip likely in the gastric fundus. No other interval change. Electronically Signed   By: Anner Crete M.D.   On: 02/15/2018 01:18   Dg Abd Portable 1v  Result Date: 02/26/2018 CLINICAL DATA:  Nasogastric tube placement. EXAM: PORTABLE ABDOMEN - 1 VIEW COMPARISON:  Radiographs same day. FINDINGS: Distal tip of feeding tube remains within proximal stomach. No abnormal bowel dilatation is noted. Residual contrast is noted in the colon. IMPRESSION: Distal tip of feeding tube remains within proximal stomach. No abnormal bowel dilatation is noted. Electronically Signed   By: Marijo Conception, M.D.   On: 02/26/2018 12:14   Dg Abd Portable 1v  Result Date: 02/26/2018 CLINICAL DATA:  Post NG tube placement. EXAM: PORTABLE ABDOMEN - 1 VIEW COMPARISON:  None. FINDINGS: There is a feeding tube terminating in the left upper quadrant, likely in the gastric fundus. Contrast is seen throughout the colon. No evidence of bowel obstruction. No other acute abnormalities. IMPRESSION: A feeding tube terminates in the region of the gastric fundus. Recommend repositioning before use. Electronically Signed   By: Dorise Bullion III M.D   On: 02/26/2018 12:10   Dg Abd Portable 1v  Result Date: 02/19/2018 CLINICAL DATA:  OG tube placement. EXAM: PORTABLE ABDOMEN - 1 VIEW COMPARISON:  None FINDINGS: OG tube tip is in the fundus of the stomach. Bowel gas pattern is normal. Increased density in the right side of the  abdomen may be due to an enlarged right lobe of the liver. Lumbar chronic elevation of the left hemidiaphragm. Scoliosis. IMPRESSION: OG tube tip in good position in the fundus of the stomach. Possible enlargement of the right lobe of the liver. Electronically Signed   By: Lorriane Shire M.D.   On: 02/19/2018 13:16   Dg Swallowing Func-speech Pathology  Result Date: 02/16/2018 Objective Swallowing Evaluation: Type of Study: MBS-Modified Barium Swallow Study  Patient Details Name: Marvin Mendez MRN: [id number] Date of Birth: Jul 13, 1945 Today's Date: 02/16/2018 Time: SLP Start Time (ACUTE ONLY): 1210 -SLP Stop Time (ACUTE ONLY): 1235 SLP Time Calculation (min) (ACUTE ONLY): 25 min Past Medical History: Past Medical History: Diagnosis Date . Anemia  . Hypertension  . Renal disorder  Past Surgical History: No past surgical history on file.  Subjective: Pt asleep, able to rouse Assessment / Plan / Recommendation Marvin Mendez IP CLINICAL IMPRESSIONS 02/16/2018 Clinical Impression  Pt demonstrates a mild oral dysphagia and moderate pharyngeal dysphagia characterized by right labial and lingual weakness though lingual propulsion and mastication of liquids and soft soldis appear adequate with only mild residue and anterior spillage. Pharyngeal impairment includes intermittently decreased movement of the base of tongue and upper pharyngeal constrictors for bolus propulsion leading to vallecular residuals ranging from severe to mild. Larger bolus size does appear to trigger best propulsion. A head turn left was also significantly better at reducing vallecular residuals than any other posture. Pt senses residue and sometimes his ineffective efforts to clear appear like gagging. Recommend dys 2 (fine chip) and nectar thick liquids as thin liquids did result in silent aspiration before the swallow with larger boluses. Pts cough is not effective to clear. Will follow closely for tolerance.  SLP Visit Diagnosis Dysphagia, oropharyngeal  phase (R13.12) Attention and concentration deficit following -- Frontal lobe and executive function deficit following -- Impact on safety and function --   Marvin Mendez IP TREATMENT RECOMMENDATION 02/16/2018 Treatment Recommendations Therapy as outlined in treatment plan below   Prognosis 02/16/2018 Prognosis for Safe Diet Advancement Good Barriers to Reach Goals -- Barriers/Prognosis Comment -- Marvin Mendez IP DIET RECOMMENDATION 02/16/2018 SLP Diet Recommendations Dysphagia 2 (Fine chop) solids;Nectar thick liquid Liquid Administration via Straw Medication Administration Crushed with puree Compensations Slow rate;Small sips/bites;Minimize environmental distractions Postural Changes (No Data)   Marvin Mendez IP OTHER RECOMMENDATIONS 02/16/2018 Recommended Consults -- Oral Care Recommendations Oral care BID Other Recommendations Have oral suction available   Marvin Mendez IP FOLLOW UP RECOMMENDATIONS 02/16/2018 Follow up Recommendations Inpatient Rehab   Marvin Mendez IP FREQUENCY AND DURATION 02/16/2018 Speech Therapy Frequency (ACUTE ONLY) min 2x/week Treatment Duration 2 weeks      Marvin Mendez IP ORAL PHASE 02/16/2018 Oral Phase Impaired Oral - Pudding Teaspoon -- Oral - Pudding Cup -- Oral - Honey Teaspoon -- Oral - Honey Cup -- Oral - Nectar Teaspoon -- Oral - Nectar Cup -- Oral - Nectar Straw Right anterior bolus loss;Weak lingual manipulation;Decreased bolus cohesion Oral - Thin Teaspoon -- Oral - Thin Cup -- Oral - Thin Straw Decreased bolus cohesion;Weak lingual manipulation;Right anterior bolus loss Oral - Puree Decreased bolus cohesion;Reduced posterior propulsion;Weak lingual manipulation;Delayed oral transit Oral - Mech Soft Decreased bolus cohesion;Reduced posterior propulsion;Weak lingual manipulation;Delayed oral transit Oral - Regular -- Oral - Multi-Consistency -- Oral - Pill -- Oral Phase - Comment --  Marvin Mendez IP PHARYNGEAL PHASE 02/16/2018 Pharyngeal Phase Impaired Pharyngeal- Pudding Teaspoon -- Pharyngeal -- Pharyngeal- Pudding Cup -- Pharyngeal -- Pharyngeal-  Honey Teaspoon -- Pharyngeal -- Pharyngeal- Honey Cup NT Pharyngeal -- Pharyngeal- Nectar Teaspoon -- Pharyngeal -- Pharyngeal- Nectar Cup -- Pharyngeal -- Pharyngeal- Nectar Straw Delayed swallow initiation-pyriform sinuses;Delayed swallow initiation-vallecula;Reduced pharyngeal peristalsis;Reduced tongue base retraction;Penetration/Apiration after swallow;Trace aspiration;Pharyngeal residue - valleculae Pharyngeal Material enters airway, CONTACTS cords and not ejected out;Material does not enter airway Pharyngeal- Thin Teaspoon -- Pharyngeal -- Pharyngeal- Thin Cup -- Pharyngeal -- Pharyngeal- Thin Straw Delayed swallow initiation-pyriform sinuses;Delayed swallow initiation-vallecula;Reduced pharyngeal peristalsis;Reduced tongue base retraction;Trace aspiration;Pharyngeal residue - valleculae;Penetration/Aspiration before swallow Pharyngeal Material enters airway, passes BELOW cords without attempt by patient to eject out (silent aspiration) Pharyngeal- Puree Delayed swallow initiation-pyriform sinuses;Delayed swallow initiation-vallecula;Reduced pharyngeal peristalsis;Reduced tongue base retraction;Pharyngeal residue - valleculae Pharyngeal Material does not enter airway Pharyngeal- Mechanical Soft Delayed swallow initiation-pyriform sinuses;Delayed swallow initiation-vallecula;Reduced pharyngeal peristalsis;Reduced tongue base retraction;Pharyngeal residue - valleculae Pharyngeal -- Pharyngeal- Regular -- Pharyngeal -- Pharyngeal- Multi-consistency -- Pharyngeal -- Pharyngeal- Pill -- Pharyngeal -- Pharyngeal Comment --  No flowsheet data found. [employer],  MA CCC-SLP Acute Rehabilitation Services Pager [phone] Office [phone] Lynann Beaver 02/16/2018, 1:53 PM               Microbiology No results found for this or any previous visit (from the past 240 hour(s)).  Lab Basic Metabolic Panel: No results for input(s): NA, K, CL, CO2, GLUCOSE, BUN, CREATININE, CALCIUM, MG, PHOS in the  last 168 hours. Liver Function Tests: No results for input(s): AST, ALT, ALKPHOS, BILITOT, PROT, ALBUMIN in the last 168 hours. No results for input(s): LIPASE, AMYLASE in the last 168 hours. No results for input(s): AMMONIA in the last 168 hours. CBC: No results for input(s): WBC, NEUTROABS, HGB, HCT, MCV, PLT in the last 168 hours. Cardiac Enzymes: No results for input(s): CKTOTAL, CKMB, CKMBINDEX, TROPONINI in the last 168 hours. Sepsis Labs: No results for input(s): PROCALCITON, WBC, LATICACIDVEN in the last 168 hours.  Procedures/Operations  Mechanical ventilation.  Percutaneous tracheostomy.   Chasyn Cinque 03/12/2018, 12:09 PM

## 2018-03-14 DEATH — deceased

## 2019-04-22 IMAGING — DX DG CHEST 1V PORT
1 series · 2 of 2 positions shown · non-contrast
Comparison: February 24, 2018

CLINICAL DATA: Hypoxia

EXAM:
PORTABLE CHEST 1 VIEW

[Series 1: chest · 0.14mm/px · 2 of 2 slices shown]
[im 1/2]
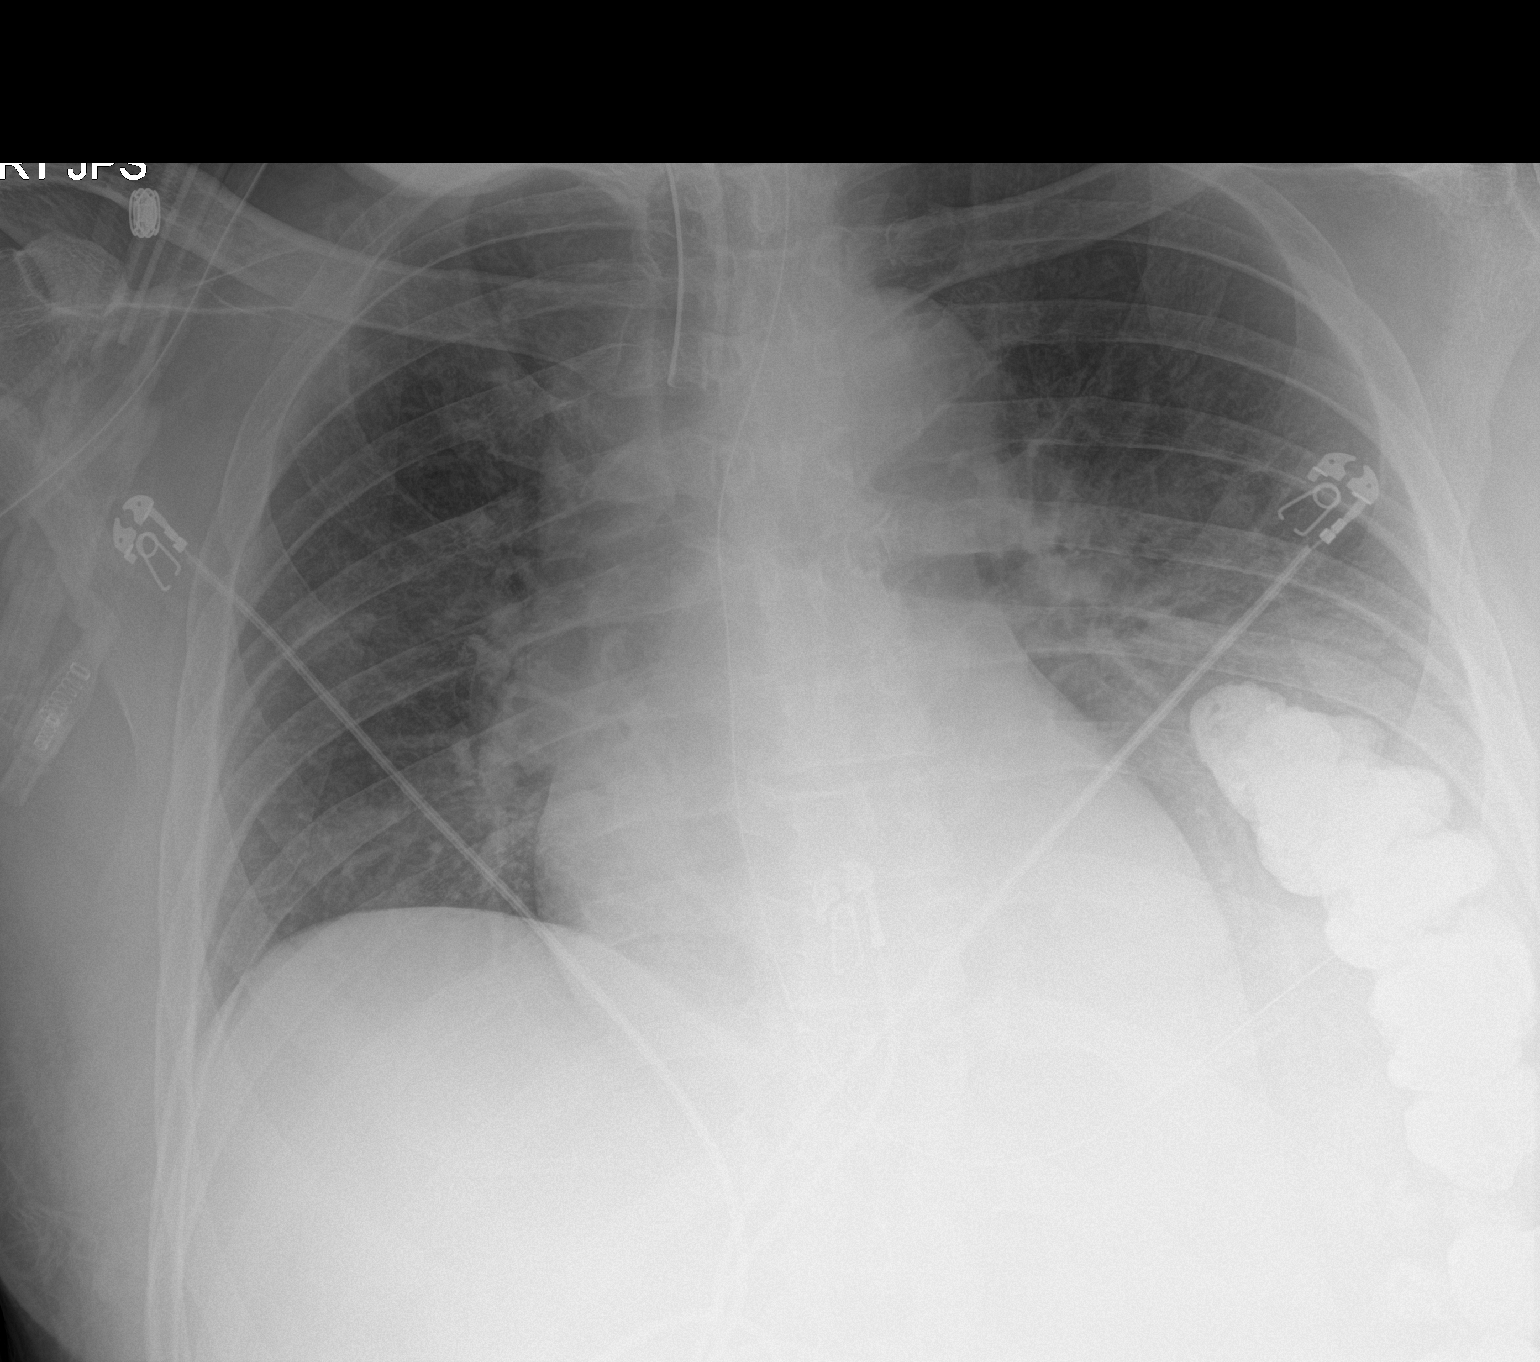
[im 2/2]
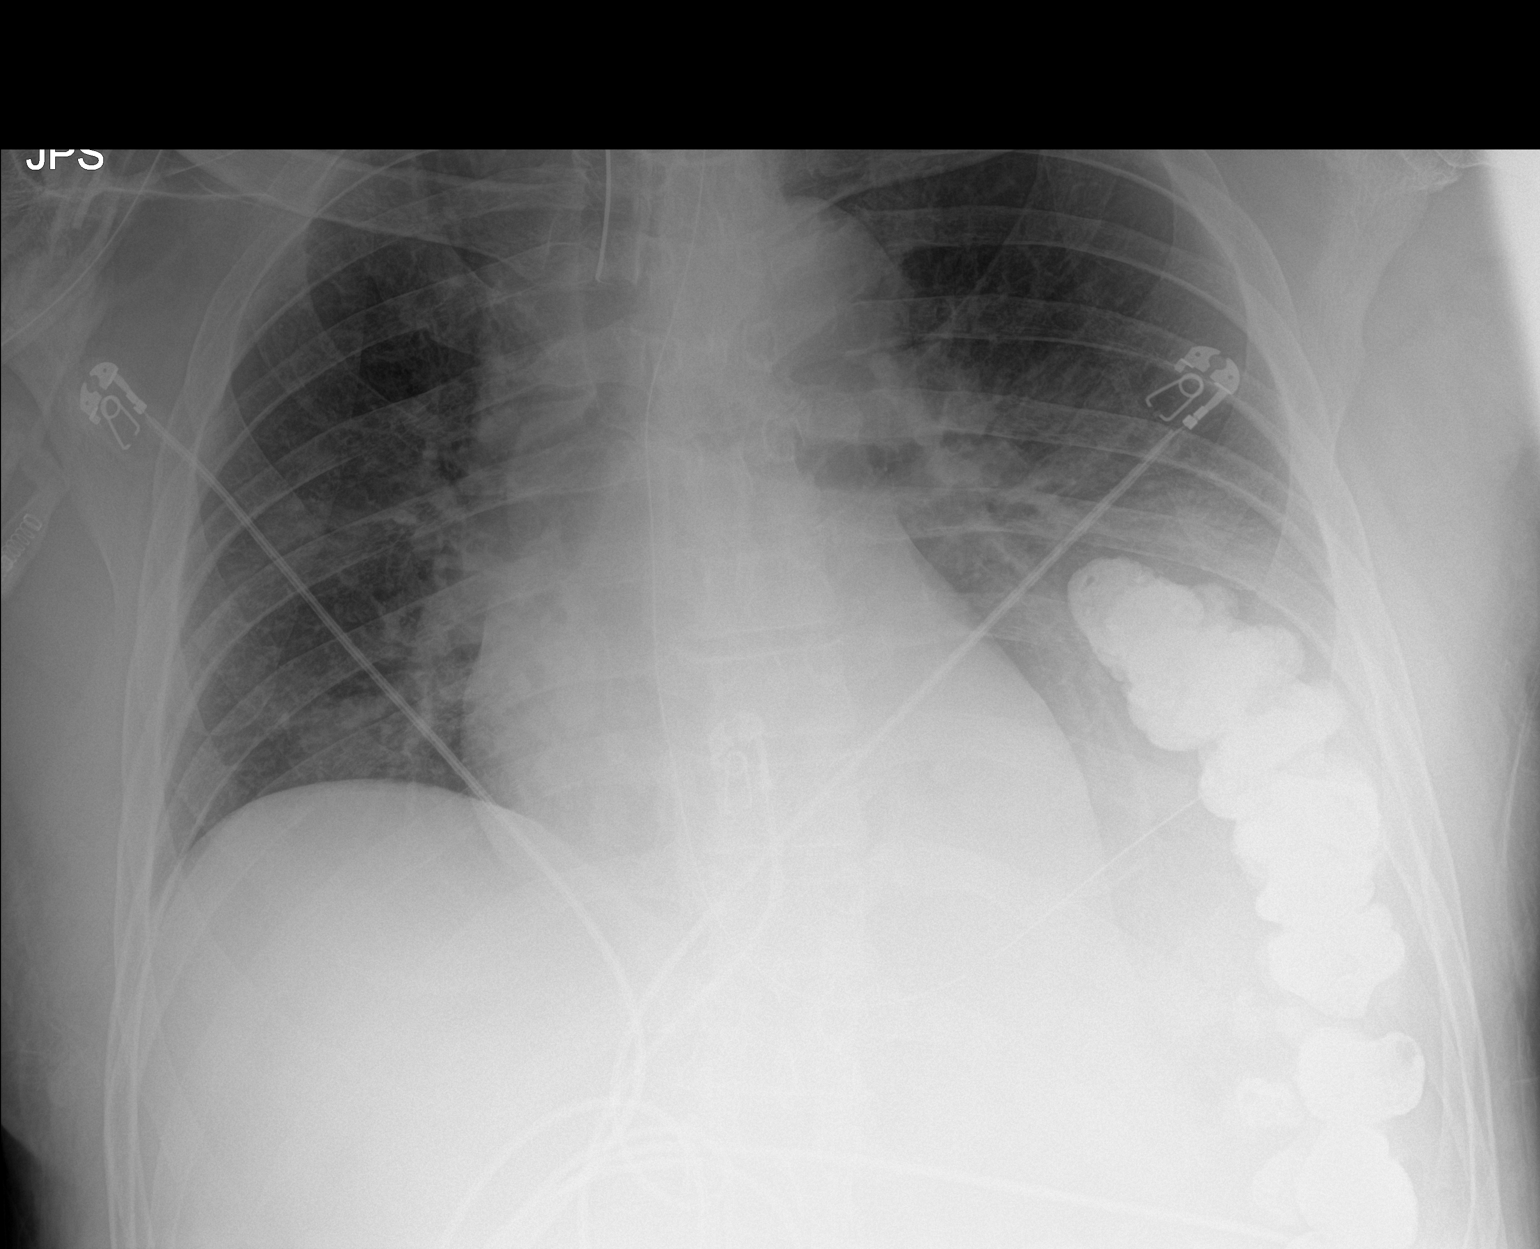

[2 of 2 positions shown; findings below may reference images not displayed]

FINDINGS: Endotracheal tube tip is 3.6 cm above the carina. Nasogastric tube
tip and side port are in the stomach. There is persistent elevation
of the left hemidiaphragm with left base atelectasis. The lungs
elsewhere are clear. Heart is mildly enlarged with pulmonary
vascularity normal, stable. No bone lesions.
IMPRESSION: Tube positions as described without pneumothorax. Persistent
elevation left hemidiaphragm with left base atelectasis. No
consolidation. Stable cardiac silhouette.

## 2019-04-24 IMAGING — DX DG CHEST 1V PORT
1 series · 1 of 1 positions shown · non-contrast
Comparison: February 27, 2018

CLINICAL DATA: Hypoxia

EXAM:
PORTABLE CHEST 1 VIEW

[chest ap]
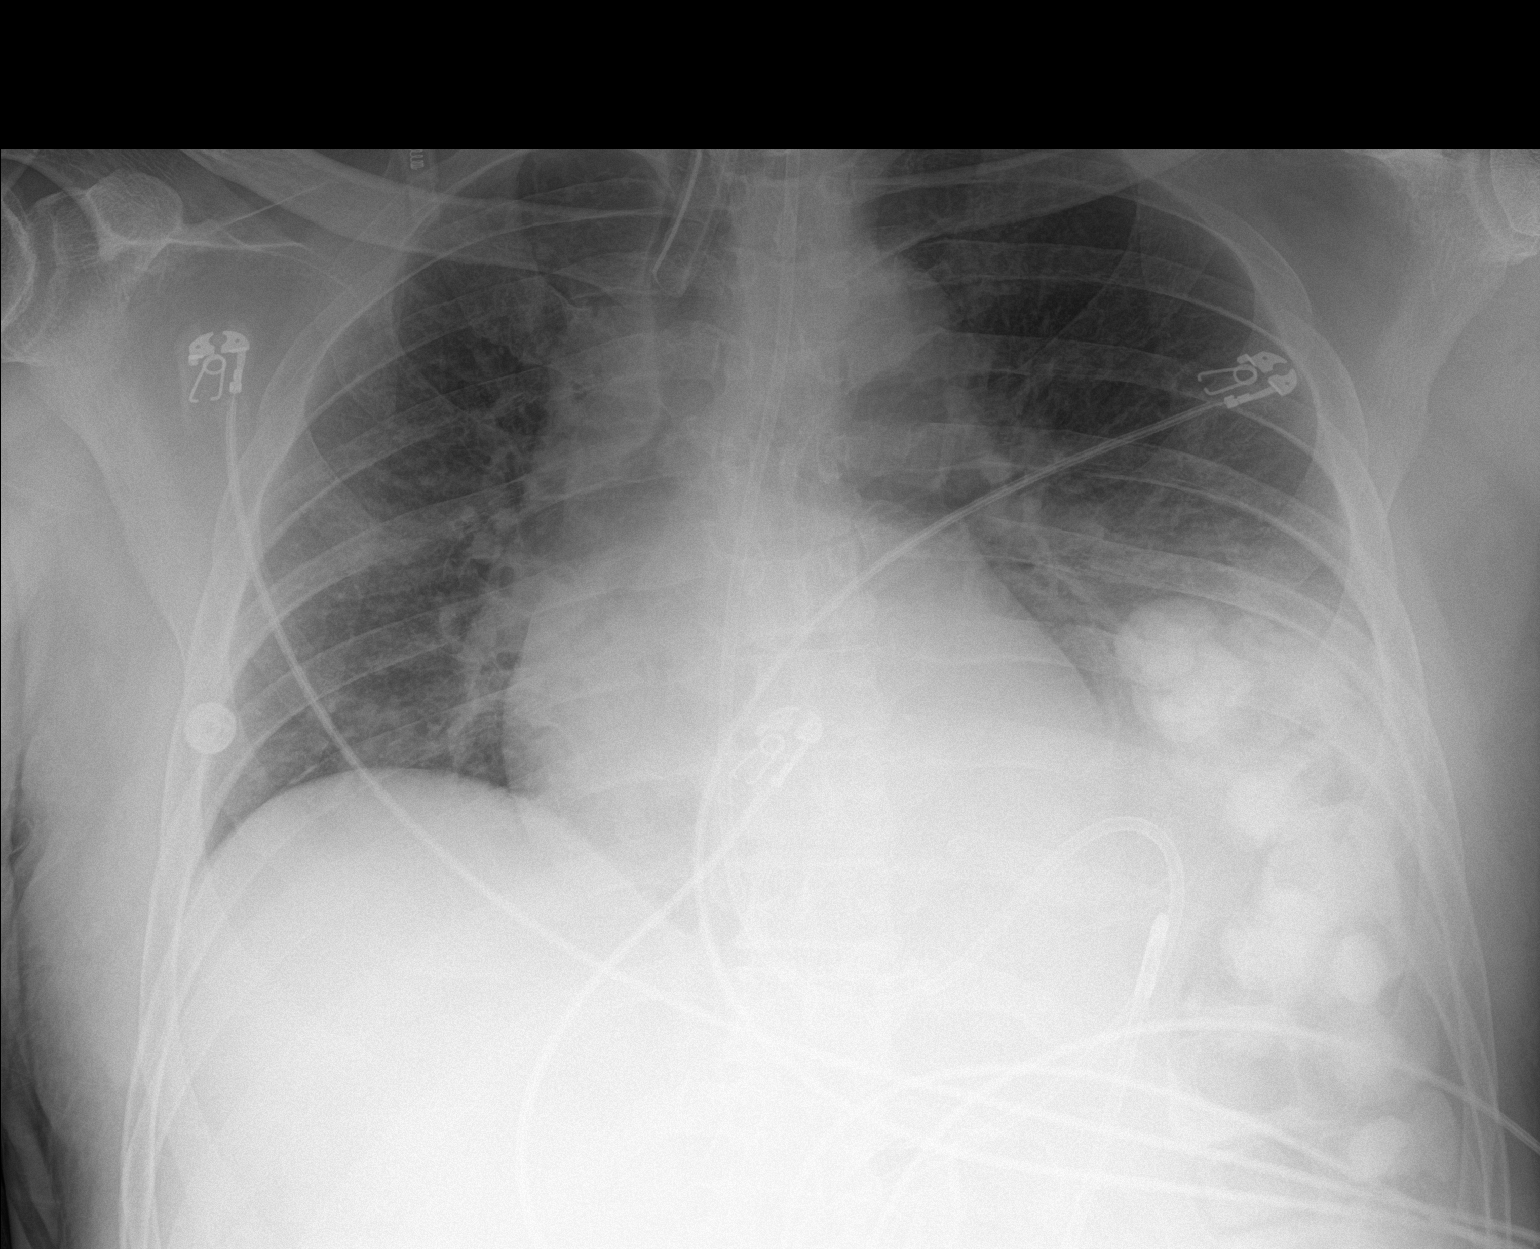

[1 of 1 positions shown; findings below may reference images not displayed]

FINDINGS: Endotracheal tube tip is 4.2 cm above the carina. Feeding tube tip
is in the proximal jejunum. No pneumothorax. There is elevation of
the left hemidiaphragm, stable. There is left base atelectasis.
Lungs elsewhere clear. Heart is upper normal in size with pulmonary
vascularity normal. No adenopathy. No bone lesions.
IMPRESSION: Tube positions as described without pneumothorax. Elevation left
hemidiaphragm with left base atelectasis. Lungs elsewhere clear.
Stable cardiac silhouette.
